# Patient Record
Sex: Male | Born: 1945
Health system: Southern US, Community
[De-identification: ages and names within clinical notes are randomized; demographics above are authoritative.]

## PROBLEM LIST (undated history)

## (undated) DIAGNOSIS — I480 Paroxysmal atrial fibrillation: Secondary | ICD-10-CM

## (undated) DIAGNOSIS — H409 Unspecified glaucoma: Secondary | ICD-10-CM

## (undated) DIAGNOSIS — I509 Heart failure, unspecified: Secondary | ICD-10-CM

## (undated) DIAGNOSIS — R918 Other nonspecific abnormal finding of lung field: Secondary | ICD-10-CM

## (undated) DIAGNOSIS — I10 Essential (primary) hypertension: Secondary | ICD-10-CM

## (undated) DIAGNOSIS — F17200 Nicotine dependence, unspecified, uncomplicated: Secondary | ICD-10-CM

## (undated) DIAGNOSIS — N183 Chronic kidney disease, stage 3 unspecified: Secondary | ICD-10-CM

## (undated) DIAGNOSIS — D472 Monoclonal gammopathy: Secondary | ICD-10-CM

## (undated) DIAGNOSIS — I2510 Atherosclerotic heart disease of native coronary artery without angina pectoris: Secondary | ICD-10-CM

## (undated) DIAGNOSIS — I714 Abdominal aortic aneurysm, without rupture, unspecified: Secondary | ICD-10-CM

## (undated) DIAGNOSIS — E119 Type 2 diabetes mellitus without complications: Secondary | ICD-10-CM

## (undated) HISTORY — DX: Essential (primary) hypertension: I10

## (undated) HISTORY — DX: Type 2 diabetes mellitus without complications: E11.9

## (undated) HISTORY — DX: Nicotine dependence, unspecified, uncomplicated: F17.200

## (undated) HISTORY — PX: CORNEAL TRANSPLANT: SHX108

## (undated) HISTORY — DX: Heart failure, unspecified: I50.9

## (undated) HISTORY — DX: Unspecified glaucoma: H40.9

## (undated) HISTORY — PX: OTHER SURGICAL HISTORY: SHX169

## (undated) HISTORY — PX: EYE SURGERY: SHX253

## (undated) HISTORY — DX: Abdominal aortic aneurysm, without rupture: I71.4

## (undated) HISTORY — DX: Abdominal aortic aneurysm, without rupture, unspecified: I71.40

## (undated) HISTORY — DX: Atherosclerotic heart disease of native coronary artery without angina pectoris: I25.10

---

## 1998-05-27 ENCOUNTER — Ambulatory Visit (HOSPITAL_COMMUNITY): Admission: RE | Admit: 1998-05-27 | Discharge: 1998-05-27 | Payer: Self-pay | Admitting: Gastroenterology

## 1999-04-10 ENCOUNTER — Emergency Department (HOSPITAL_COMMUNITY): Admission: EM | Admit: 1999-04-10 | Discharge: 1999-04-10 | Payer: Self-pay | Admitting: Emergency Medicine

## 1999-04-10 ENCOUNTER — Encounter: Payer: Self-pay | Admitting: Emergency Medicine

## 2006-01-19 ENCOUNTER — Emergency Department (HOSPITAL_COMMUNITY): Admission: EM | Admit: 2006-01-19 | Discharge: 2006-01-19 | Payer: Self-pay | Admitting: *Deleted

## 2006-04-06 ENCOUNTER — Ambulatory Visit: Payer: Self-pay | Admitting: Family Medicine

## 2006-04-10 ENCOUNTER — Ambulatory Visit: Payer: Self-pay | Admitting: Internal Medicine

## 2006-04-10 ENCOUNTER — Ambulatory Visit: Payer: Self-pay | Admitting: Pulmonary Disease

## 2006-04-10 ENCOUNTER — Inpatient Hospital Stay (HOSPITAL_COMMUNITY): Admission: EM | Admit: 2006-04-10 | Discharge: 2006-04-14 | Payer: Self-pay | Admitting: Emergency Medicine

## 2006-04-29 ENCOUNTER — Ambulatory Visit: Payer: Self-pay | Admitting: Cardiology

## 2006-05-11 ENCOUNTER — Ambulatory Visit: Payer: Self-pay | Admitting: Pulmonary Disease

## 2006-05-19 ENCOUNTER — Ambulatory Visit: Payer: Self-pay | Admitting: Family Medicine

## 2006-05-24 ENCOUNTER — Ambulatory Visit (HOSPITAL_COMMUNITY): Admission: RE | Admit: 2006-05-24 | Discharge: 2006-05-24 | Payer: Self-pay | Admitting: Pulmonary Disease

## 2006-06-03 ENCOUNTER — Ambulatory Visit: Payer: Self-pay | Admitting: Cardiology

## 2006-07-14 ENCOUNTER — Ambulatory Visit: Payer: Self-pay | Admitting: Internal Medicine

## 2006-08-03 ENCOUNTER — Ambulatory Visit: Payer: Self-pay | Admitting: Family Medicine

## 2006-09-03 ENCOUNTER — Ambulatory Visit: Payer: Self-pay | Admitting: Internal Medicine

## 2006-09-03 LAB — CONVERTED CEMR LAB
BUN: 19 mg/dL (ref 6–23)
Creatinine, Ser: 1.2 mg/dL (ref 0.4–1.5)
GFR calc non Af Amer: 66 mL/min
Glomerular Filtration Rate, Af Am: 79 mL/min/{1.73_m2}
Potassium: 3.8 meq/L (ref 3.5–5.1)
Pro B Natriuretic peptide (BNP): 15 pg/mL (ref 0.0–100.0)
Sodium: 140 meq/L (ref 135–145)

## 2006-09-07 ENCOUNTER — Ambulatory Visit: Payer: Self-pay | Admitting: Family Medicine

## 2006-09-29 ENCOUNTER — Ambulatory Visit: Payer: Self-pay | Admitting: Cardiology

## 2006-10-11 ENCOUNTER — Ambulatory Visit: Payer: Self-pay | Admitting: Internal Medicine

## 2006-10-13 ENCOUNTER — Ambulatory Visit (HOSPITAL_COMMUNITY): Admission: RE | Admit: 2006-10-13 | Discharge: 2006-10-14 | Payer: Self-pay | Admitting: Orthopedic Surgery

## 2006-11-02 ENCOUNTER — Ambulatory Visit: Payer: Self-pay | Admitting: Cardiology

## 2006-11-17 ENCOUNTER — Ambulatory Visit: Payer: Self-pay | Admitting: Family Medicine

## 2006-12-13 ENCOUNTER — Ambulatory Visit: Payer: Self-pay

## 2006-12-13 ENCOUNTER — Encounter: Payer: Self-pay | Admitting: Cardiovascular Disease

## 2007-01-03 ENCOUNTER — Ambulatory Visit: Payer: Self-pay | Admitting: Cardiology

## 2007-01-03 LAB — CONVERTED CEMR LAB
BUN: 20 mg/dL (ref 6–23)
GFR calc Af Amer: 98 mL/min
Potassium: 3.6 meq/L (ref 3.5–5.1)
Pro B Natriuretic peptide (BNP): 24 pg/mL (ref 0.0–100.0)
Sodium: 137 meq/L (ref 135–145)

## 2007-02-15 ENCOUNTER — Ambulatory Visit: Payer: Self-pay | Admitting: Cardiology

## 2007-02-17 ENCOUNTER — Ambulatory Visit: Payer: Self-pay | Admitting: Family Medicine

## 2007-03-24 ENCOUNTER — Ambulatory Visit: Payer: Self-pay | Admitting: Internal Medicine

## 2007-04-21 ENCOUNTER — Ambulatory Visit: Payer: Self-pay | Admitting: Cardiology

## 2007-05-10 ENCOUNTER — Ambulatory Visit: Payer: Self-pay | Admitting: Cardiology

## 2007-05-10 ENCOUNTER — Ambulatory Visit: Payer: Self-pay | Admitting: Internal Medicine

## 2007-05-10 LAB — CONVERTED CEMR LAB
BUN: 19 mg/dL (ref 6–23)
GFR calc Af Amer: 79 mL/min
Potassium: 3.3 meq/L — ABNORMAL LOW (ref 3.5–5.1)
Sodium: 141 meq/L (ref 135–145)

## 2007-05-19 ENCOUNTER — Ambulatory Visit: Payer: Self-pay | Admitting: Family Medicine

## 2007-06-04 ENCOUNTER — Emergency Department (HOSPITAL_COMMUNITY): Admission: EM | Admit: 2007-06-04 | Discharge: 2007-06-05 | Payer: Self-pay | Admitting: Emergency Medicine

## 2007-07-05 ENCOUNTER — Ambulatory Visit: Payer: Self-pay | Admitting: Cardiology

## 2007-10-03 ENCOUNTER — Ambulatory Visit: Payer: Self-pay | Admitting: Cardiology

## 2007-10-03 LAB — CONVERTED CEMR LAB
ALT: 16 units/L (ref 0–53)
AST: 17 units/L (ref 0–37)
Albumin: 3.4 g/dL — ABNORMAL LOW (ref 3.5–5.2)
Calcium: 9.1 mg/dL (ref 8.4–10.5)
Chloride: 104 meq/L (ref 96–112)
Cholesterol: 189 mg/dL (ref 0–200)
Creatinine, Ser: 1.2 mg/dL (ref 0.4–1.5)
GFR calc non Af Amer: 65 mL/min
Glucose, Bld: 84 mg/dL (ref 70–99)
HDL: 38.3 mg/dL — ABNORMAL LOW (ref >39.0)
Pro B Natriuretic peptide (BNP): 22 pg/mL (ref 0.0–100.0)
Sodium: 140 meq/L (ref 135–145)
Total Bilirubin: 0.4 mg/dL (ref 0.3–1.2)
VLDL: 20 mg/dL (ref 0–40)

## 2007-11-08 ENCOUNTER — Ambulatory Visit: Payer: Self-pay | Admitting: Cardiology

## 2008-11-29 DIAGNOSIS — I472 Ventricular tachycardia: Secondary | ICD-10-CM

## 2008-11-29 DIAGNOSIS — I4729 Other ventricular tachycardia: Secondary | ICD-10-CM | POA: Insufficient documentation

## 2008-11-29 DIAGNOSIS — I5020 Unspecified systolic (congestive) heart failure: Secondary | ICD-10-CM | POA: Insufficient documentation

## 2008-11-29 DIAGNOSIS — H409 Unspecified glaucoma: Secondary | ICD-10-CM | POA: Insufficient documentation

## 2008-11-29 DIAGNOSIS — I2510 Atherosclerotic heart disease of native coronary artery without angina pectoris: Secondary | ICD-10-CM

## 2008-11-29 DIAGNOSIS — I509 Heart failure, unspecified: Secondary | ICD-10-CM

## 2008-11-29 DIAGNOSIS — I10 Essential (primary) hypertension: Secondary | ICD-10-CM | POA: Insufficient documentation

## 2008-11-30 ENCOUNTER — Ambulatory Visit: Payer: Self-pay | Admitting: Cardiology

## 2008-11-30 ENCOUNTER — Encounter: Payer: Self-pay | Admitting: Nurse Practitioner

## 2008-11-30 DIAGNOSIS — I714 Abdominal aortic aneurysm, without rupture: Secondary | ICD-10-CM

## 2008-11-30 DIAGNOSIS — F17200 Nicotine dependence, unspecified, uncomplicated: Secondary | ICD-10-CM | POA: Insufficient documentation

## 2008-11-30 DIAGNOSIS — I5022 Chronic systolic (congestive) heart failure: Secondary | ICD-10-CM

## 2008-11-30 DIAGNOSIS — I779 Disorder of arteries and arterioles, unspecified: Secondary | ICD-10-CM | POA: Insufficient documentation

## 2008-12-17 ENCOUNTER — Telehealth: Payer: Self-pay | Admitting: Cardiology

## 2009-10-29 ENCOUNTER — Ambulatory Visit: Payer: Self-pay | Admitting: Family Medicine

## 2011-01-06 NOTE — Assessment & Plan Note (Signed)
[facility]                          CHRONIC HEART FAILURE NOTE   NAME:RORIEFayez, Sturgell                        MRN:          [id number]  DATE:01/03/2007                            DOB:          [birth year]    ADDENDUM:  On his medication clarification, the patient is supposed to  be taking Lisinopril 5 mg p.o. b.i.d.  He states he has only been taking  it once a day for several weeks because he was afraid he was going to  run out.  Blood pressure today is 109/72.  I am okay with him just  continuing 5 mg daily.  If his blood pressure starts to run high again,  we will have to increase the dose.  For now we will leave it at that.      [name], ACNP       Rollene Rotunda, MD, [facility]    MB/MedQ  DD: 01/03/2007  DT: 01/03/2007  Job #: [id number]   cc:   Sharlot Gowda, M.D.

## 2011-01-06 NOTE — Assessment & Plan Note (Signed)
[facility]                          CHRONIC HEART FAILURE NOTE   NAME:[name], [name]                        MRN:          [id number]  DATE:05/10/2007                            DOB:          06/05/1946    PRIMARY CARE PHYSICIAN:  Dr. Sharlot Gowda.   PRIMARY CARDIOLOGIST:  Dr. Rollene Rotunda.   [name] returns today for followup of his congestive heart failure which  is secondary to nonischemic cardiomyopathy with an EF currently 35 to  40% by echocardiogram.  Previous EF as low as 15%.  [name] states he  has been doing quite well.  He saw Dr. Antoine Poche last month for a routine  cardiology visit, at which time further titration of his ACE inhibitor  was initiated with patient denying any adverse reactions.  He denies any  symptoms suggestive of volume overload, continues to remain Class I [place] Heart Association in regards to symptoms.  Ongoing tobacco use.   PAST MEDICAL HISTORY:  1. Congestive heart failure secondary to nonischemic cardiomyopathy      with EF currently 35 to 40%.  2. Status post cardiac catheterization in 2007 showed nonobstructive      disease, EF at that time was 15%.  3. Hypertension.  4. Avoid tobacco use.  5. History of medical noncompliance.  6. Glaucoma.  7. History of benign pulmonary nodules.  8. History of nonsustained V-tach.   REVIEW OF SYSTEMS:  As stated above.   CURRENT MEDICATIONS:  1. Aspirin 81.  2. Travatan and Cosopt eye drops.  3. Bisoprolol HCT 10/6.25 daily.  4. Furosemide previously was 20 mg, the patient states he was told to      increase it to 40 mg at last visit with Dr. Antoine Poche.  5. Lisinopril 7.5 mg daily.   PHYSICAL EXAM:  Weight 160, blood pressure 114/67 with a heart rate of  61.  [name] is in no acute distress.  No jugular vein distention at 45  degree angle.  LUNGS:  Clear to auscultation bilaterally.  CARDIOVASCULAR EXAM:  An S1 and S2, regular rate and rhythm.  ABDOMEN:   Soft, nontender, positive bowel sounds.  LOWER EXTREMITIES:  Without clubbing, cyanosis or edema.  NEUROLOGIC:  Alert and oriented x3.   IMPRESSION:  Heart failure, currently Class I [place] Heart  Association.  Will increase lisinopril to 10 mg by mouth daily, continue  other current medications.  Once again encouraged smoking cessation.  Will check blood work today, previously ordered by Dr. Antoine Poche, and see  patient back in two months.      [name], ACNP  Electronically Signed      [name], MD  Electronically Signed   MB/MedQ  DD: 05/10/2007  DT: 05/10/2007  Job #: [id number]   cc:   Sharlot Gowda, M.D.

## 2011-01-06 NOTE — Assessment & Plan Note (Signed)
[facility]                          CHRONIC HEART FAILURE NOTE   NAME:[name], [name]                        MRN:          [id number]  DATE:02/15/2007                            DOB:          May 08, 1946    [name] returns today for followup of his congestive heart failure which  is secondary to non-ischemic cardiomyopathy with an EF previously 15%,  currently 35% to 40% by echocardiogram.  [name] states he has been  doing well.  He still complains of lack of energy.  He says he has no  get up and go; however, he continues to try to remain active, for  example, he still push mows his lawn.  He states what used to take him  40 minutes now takes him about an hour and a half. He does it in stages,  take a break and rest.  He denies any increased shortness of breath with  exertion.  He states he just does not have the energy he used to.  He  unfortunately continues to smoke at least a half a pack of cigarettes a  day.  He denies any peripheral edema, chest discomfort, presyncope or  syncopal episodes.  States he sleeps good.  Compliant with medications.   PAST MEDICAL HISTORY:  1. Includes congestive heart failure, secondary to non-ischemic      cardiomyopathy.  Status post cardiac catheterization in 2007showed      no obstructive disease with EF of 15% at that time.  Recent      echocardiogram in April 2008 showed an improvement EF 35% to 40%.  2. Hypertension.  3. Ongoing tobacco use  4. History of medical noncompliance.  5. Glaucoma.  6. History of pulmonary nodules.   REVIEW OF SYSTEMS:  As stated above.   CURRENT MEDICATIONS:  Include:  1. Aspirin 81 mg daily.  2. Furosemide, patient has 40 mg tablet, he takes 1/2 tablet daily.  3. Cosopt and Travatan eye drops.  4. Toprol/hydrochlorothiazide 10/6.25 mg daily.  5. Lisinopril 5 mg daily.   PHYSICAL EXAMINATION:  Weight today 160 pounds, weight is down 6 pounds  from May.  Blood pressure  90/69, pulse 56.  [name] is in no acute distress.  No jugular vein distention at 45 degree angle.  LUNGS:  Clear to auscultation today.  I do not hear any rhonchi or  wheezing.  ABDOMEN:  Soft, nontender, positive bowel sounds.  CARDIOVASCULAR:  Reveals an S1 and S2, regular rate and rhythm.  LOWER EXTREMITIES:  Without clubbing, cyanosis or edema.   IMPRESSION:  Stable Class II heart failure at this time.  No symptoms  such as volume overload with ongoing tobacco use.  Will continue current  medications.  I had obtained blood work on [place] back in May when I  saw him.  Potassium was 3.6, glucose 125, BUN, creatinine 20 and 1.0,  BNP was 24.  I will send a copy of patient's blood work to Dr. Susann Givens,  as patient's glucose has been mildly elevated the last few times he has  had a BMET drawn.  Otherwise, no changes  at this time.  The patient  needs to follow up with Dr. Antoine Poche for routine cardiology visit.  I  will arrange this at his next appointment.      [name], ACNP  Electronically Signed      Rollene Rotunda, MD, [facility]  Electronically Signed   MB/MedQ  DD: 02/15/2007  DT: 02/15/2007  Job #: [id number]   cc:   Sharlot Gowda, M.D.

## 2011-01-06 NOTE — Assessment & Plan Note (Signed)
[facility]                          CHRONIC HEART FAILURE NOTE   [name], [name]                        MRN:          [id number]  DATE:10/03/2007                            DOB:          03-27-1946    PRIMARY CARDIOLOGIST:  Rollene Rotunda, MD.   PRIMARY CARE PHYSICIAN:  Sharlot Gowda, M.D.   HISTORY:  [name] returns today for follow up of his congestive heart  failure which is secondary to nonischemic cardiomyopathy with EF  currently 35-40% by echocardiogram, previously as low as 15%.  I have  not seen [name] since November.  He states he has been doing well  since that time.  He complains of intolerance to the cold weather.  States he has not been as active as he normally is and this accounts for  his weight gain.  He denies any symptoms suggestive of volume overload.  Denies any syncope, palpitations.  When I  last saw [name] back in  the fall, he has had a syncopal episode in the setting of ETOH  intoxication.  States he is no longer drinking.  He does continue to  smoke about a pack of cigarettes a day, otherwise denies any problems.  He states he has an appointment with Dr. Susann Best in April for a physical  exam   PAST MEDICAL HISTORY:  1. Congestive heart failure secondary to nonischemic cardiomyopathy      with EF 35%.  2. Status post cardiac catheterization in 2007 showing nonobstructive      disease.  3. Hypertension.  4. Ongoing tobacco use.  5. History of medical noncompliance.  6. Glaucoma.  7. History of ETOH abuse  8. History of benign pulmonary nodules  9. History of nonsustained VTach.   REVIEW OF SYSTEMS:  As stated above, otherwise negative.   CURRENT MEDICATIONS:  1. Aspirin 81.  2. Travatan eye drops.  3. Cosopt eye drops.  4. Bisoprolol/HCTZ 10/6.25 daily.  5. Furosemide 40 mg daily.  6. Lisinopril 10 mg daily.   PHYSICAL EXAMINATION:  VITAL SIGNS:  Weight 162 pounds, weight is up 6  pounds, blood  pressure 131/83 with a heart rate of 56.  GENERAL:  [name] is in no acute distress.  NECK:  No signs of jugular vein distention at 45 degree angle.  LUNGS:  Clear to auscultation.  CARDIOVASCULAR:  Reveals S1-S2, regular rate and rhythm.  ABDOMEN:  Flat, soft, nontender, positive bowel sounds.  LOWER EXTREMITIES:  Without clubbing, cyanosis or edema.  NEUROLOGICAL:  Alert and oriented x3.   IMPRESSION:  Heart failure secondary to nonischemic cardiomyopathy class  I [place] Heart Association.  Continue current medications.  Once  again, encouraged smoking cessation.  Congratulated on for ETOH  discontinuation.  We will plan on repeating lab work today as it has  been a while since the patient had blood work done.  As [name] is  fasting, I will go ahead and check a lipid, hepatic panel on him and a  TSH.  We will ask that a copy of blood work be sent to Dr.  Lalonde.      [name], ACNP  Electronically Signed      Rollene Rotunda, MD, [facility]  Electronically Signed   MB/MedQ  DD: 10/03/2007  DT: 10/04/2007  Job #: [id number]   cc:   Sharlot Gowda, M.D.

## 2011-01-06 NOTE — Assessment & Plan Note (Signed)
[facility]                          CHRONIC HEART FAILURE NOTE   NAME:[name]                        MRN:          [id number]  DATE:07/05/2007                            DOB:          01/23/1946    PRIMARY CARE PHYSICIAN:  Dr. Sharlot Gowda.   PRIMARY CARDIOLOGIST:  Dr. Antoine Poche.   [name] returns today for followup of his congestive heart failure,  which is secondary to nonischemic cardiomyopathy with EF currently 35%  to 40% by echocardiogram, previously as low as 15%.  [name] states he  has been going quite well.  He recently suffered a fracture of his left  ankle in the setting of a syncopal episode.  In further discussion with  [name], it turns out he was celebrating the [employer] homecoming, had  consumed some EtOH and some smoked sausages, when he states the next  thing he remembers he was standing in the doorway, and then he was on  the floor.  He was seen at [facility] with an elevated  EtOH level, although at this time he currently denies any EtOH  consumption that evening.  He also denies any palpitations, chest  discomfort.  CT of the head was done.  It showed no acute findings.  An  EKG was done that showed sinus brady with a first degree; unchanged from  previous EKGs.  He states other than that episode, he feels fine.  He is  complaining of some insomnia, difficult to get comfortable at night with  the ankle fracture.  He is ambulating with assistance of cane, and is  wearing an Unna boot to the left lower extremity, and being followed by  orthopedic services.   PAST MEDICAL HISTORY:  1. Congestive heart failure, secondary to nonischemic cardiomyopathy      with EF currently 35% to 40%.  2. Status post cardiac catheterization in 2007 showed nonobstructive      disease.  EF at that time was 15.  3. Hypertension.  4. Ongoing tobacco use.  5. History of medical noncompliance.  6. Glaucoma.  7. EtOH use.  8.  History of benign pulmonary nodules.  9. History of nonsustained V-TACH.   REVIEW OF SYSTEMS:  As stated above.   CURRENT MEDICATIONS:  1. Aspirin 81.  2. Travatan and Cosopt eye drops.  3. Bisoprolol and hydrochlorothiazide 10/6.25 daily.  4. Furosemide 40 mg daily.  5. Lisinopril 10 mg daily.   CLINICAL DATA:  A 12-lead EKG today showing sinus brady with a first  degree block at rate of 55.   [name] is in no acute distress.  Weight 156 pounds today.  Weight is down 4 pounds.  Initial blood  pressure recorded at 140/80.  Manual blood pressure left arm 118/76 with  a heart rate of 64.  No signs of jugular vein distention at 45-degree angle.  LUNGS:  Clear to auscultation bilaterally.  CARDIOVASCULAR EXAM:  Reveals an S1 and S2.  Regular rate and rhythm.  ABDOMEN:  Soft, nontender.  Positive bowel sounds.  LOWER EXTREMITIES:  Right lower extremity  without clubbing, cyanosis, or  edema.  Left lower extremity is in an Unna boot at this time.  NEUROLOGIC:  Patient alert and oriented x3.   IMPRESSION:  Nonischemic cardiomyopathy, currently a class I [place]  Heart Association.  We will continue current medications.  Once again  encouraged smoking cessation and EtOH cessation.  Review of most recent  lab work in September shows a BUN and creatinine of 19 and 1.2.  We will  plan on repeating blood work at next exam.  The patient also had blood  work done at [facility] recent visit.  Lab work within  normal range at that time.      [name], ACNP  Electronically Signed      Rollene Rotunda, MD, [facility]  Electronically Signed   MB/MedQ  DD: 07/05/2007  DT: 07/05/2007  Job #: [id number]   cc:   Sharlot Gowda, M.D.

## 2011-01-06 NOTE — Assessment & Plan Note (Signed)
[facility]                            CARDIOLOGY OFFICE NOTE   [name], [name]                        MRN:          [id number]  DATE:11/08/2007                            DOB:          04-04-1946    PRIMARY CARE PHYSICIAN:  Dr. Sharlot Gowda.   REASON FOR VISIT:  Evaluate patient with dilated cardiomyopathy.   HISTORY OF PRESENT ILLNESS:  The patient is a pleasant 65 year old,  African-American gentleman with nonischemic cardiomyopathy.  He has done  well since we last saw him.  He has staying off alcohol.  He is still  smoking cigarettes unfortunately. His last EF was improved as described  below.  He is not having any dyspnea with activity such as climbing  stairs or working vigorously.  He denies any PND or orthopnea. He has  had no chest pain. He has had  no palpitations, presyncope or syncope.  He denies any orthostatic symptoms.   PAST MEDICAL HISTORY:  Congestive heart failure secondary to nonischemic  cardiomyopathy (EF currently 35-40% had been 15%), nonobstructive  coronary disease (Catheterization 2007), hypertension, ongoing tobacco  use, glaucoma, previous ETOH use, benign pulmonary nodules, non same  ventricular tachycardia.   ALLERGIES:  None.   MEDICATIONS:  1. Aspirin 81 mg daily.  2. Travatan.  3. Cosopt.  4. Bisoprolol HCT 10/6.25 daily.  5. Furosemide 40 mg daily.  6. Lisinopril 5 mg daily.   REVIEW OF SYSTEMS:  As stated in the HPI and otherwise negative for  other systems.   PHYSICAL EXAMINATION:  The patient is in no distress.  Blood pressure 91/70, heart rate 72 and regular, weight 159 pounds.  HEENT:  Eyelids unremarkable. Pupils equal round and reactive to light,  fundi not visualized, oral mucosa unremarkable.  NECK:  No jugular distention at 45 degrees, carotid upstroke brisk and  symmetrical.  No bruits, no thyromegaly.  LYMPHATICS:  No adenopathy.  LUNGS:  Clear to auscultation bilaterally.  BACK:  No  costovertebral mass.  CHEST:  Unremarkable.  HEART:  PMI not displaced or sustained, S1 and S2 within normal limits.  No S3, no S4, no clicks, no rubs, no murmurs.  ABDOMEN:  Flat, positive bowel sounds,  normal in frequency and pitch,  no bruits, no rebound, no guarding, no midline pulsatile mass, no  organomegaly.  EXTREMITIES:  2+ pulse, no edema.   EKG sinus rhythm, rate 64, right bundle branch block, left anterior  fascicular block.   ASSESSMENT/PLAN:  1. Nonischemic cardiomyopathy. The patient has class I symptoms.  His      ejection fraction is 35-40%.  There is no indication for devices.      He is tolerating the medications as listed though his blood      pressure is low.  There is no room for med titration.  He has had      his labs checked recently. At this point, he will continue with the      current regimen.  He will continue to abstain from alcohol.  2. Tobacco. We discussed the need to stop smoking and hopefully he  will work on this.  3. Hypertension.  This is no longer an issue.  4. Follow-up. I will see the patient again in 6 months or sooner if      needed.     Rollene Rotunda, MD, [facility]  Electronically Signed    JH/MedQ  DD: 11/08/2007  DT: 11/08/2007  Job #: [id number]   cc:   Sharlot Gowda, M.D.

## 2011-01-06 NOTE — Assessment & Plan Note (Signed)
[facility]                            CARDIOLOGY OFFICE NOTE   CRYSTAL, ELLWOOD                        MRN:          [id number]  DATE:04/21/2007                            DOB:          [birth year]    PRIMARY CARE PHYSICIAN:  Dr. Sharlot Gowda.   REASON FOR PRESENTATION:  Evaluate patient with cardiomyopathy.   HISTORY OF PRESENT ILLNESS:  The patient is a pleasant 65 year old  gentleman with nonischemic cardiomyopathy.  His ejection fraction  originally was 15% in 2007, both on echo and cath; however, most recent  echo in April of this year demonstrated the ejection fraction now to be  35% to 40%.  He has done quite well with medication titration.  He has  no dyspnea.  He is not as active as I would like him to be.  He does do  some yard work.  With this, he does not get any shortness of breath.  He  denies any PND or orthopnea (class I symptoms).  He denies any chest  discomfort, neck discomfort, arm discomfort, activity-induced nausea,  vomiting, excessive diaphoresis.  Unfortunately, he is continuing to  smoke cigarettes.   PAST MEDICAL HISTORY:  1. Cardiomyopathy (35% to 40%, nonischemic, status post cardiac      catheterization 2007, demonstrating nonobstructive coronary      disease).  2. Hypertension.  3. Ongoing tobacco use.  4. Previous medical noncompliance.  5. Glaucoma.  6. Pulmonary nodules felt to be benign.  7. Nonsustained ventricular tachycardia.   ALLERGIES:  None.   MEDICATIONS:  1. Aspirin 81 mg daily.  2. Travatan.  3. Cosopt.  4. Bisoprolol 10/6.25 daily.  5. Lisinopril 5 mg daily.  6. Furosemide 20 mg daily.   REVIEW OF SYSTEMS:  As stated in the HPI and otherwise negative for  other systems.   PHYSICAL EXAMINATION:  The patient is in no distress.  Blood pressure 128/81, heart rate 53 and regular.  HEENT:  Eyelids unremarkable, pupils are equal, round, and reactive to  light, fundi not visualized.  Oral  mucosa, poor dentition.  NECK:  No jugular venous distension at 45 degrees, carotid upstroke  brisk and symmetrical.  No bruit.  No thyromegaly.  LYMPHATICS:  No cervical, axillary, inguinal adenopathy.  LUNGS:  Clear to auscultation bilaterally.  BACK:  No costovertebral angle tenderness.  CHEST:  Unremarkable.  HEART:  PMI not displaced or sustained.  S1 and S2 are within normal  limits.  No S3, no S4.  No clicks, no rubs, no murmurs.  ABDOMEN:  Flat, positive bowel sounds, normal in frequency and pitch.  No bruits, no rebound, no guarding.  No midline pulsatile mass.  No  hepatomegaly, no splenomegaly.  SKIN:  No rashes, no nodules.  EXTREMITIES:  2+ pulses throughout, no edema.  No cyanosis, no clubbing.  NEURO:  Grossly intact throughout.   EKG sinus bradycardia, 1st degree AV block, left axis deviation, left  anterior fascicular block.  Poor anterior R-wave progression suggestive  of old anteroseptal infarct.  No acute ST-T wave changes.  No change  from previous EKGs.  ASSESSMENT AND PLAN:  1. Cardiomyopathy:  Today I will titrate his angiotensin-converting      enzyme inhibitor to 7.5 mg of Lisinopril daily.  I would like this      to be 10 or even 20 if possible.  We cannot titrate his beta      blocker with his heart rate.  He has had a significant improvement      in his ejection fraction with medication titration.  I have      described the importance of medications and the importance of      compliance.  2. Tobacco:  I spent greater than 3 minutes discussing the need to      quit smoking.  We discussed Chantix.  He would like to use nicotine      patches instead.  3. Followup.  Will get a BMET in 2 weeks and come back to the heart      failure clinic to see if he can have further medication titration.     Rollene Rotunda, MD, [facility]  Electronically Signed    JH/MedQ  DD: 04/21/2007  DT: 04/21/2007  Job #: [id number]   cc:   Sharlot Gowda, M.D.

## 2011-01-06 NOTE — Assessment & Plan Note (Signed)
[facility]                          CHRONIC HEART FAILURE NOTE   NAME:RORIEAndie, Mortimer                        MRN:          [id number]  DATE:03/24/2007                            DOB:          [birth year]    Mr. Saintvil returns today for followup of his congestive heart failure  which is secondary to nonischemic cardiomyopathy with an EF currently  35% to 40% by echocardiogram.  Previously as low as 15%.  Mr. Risinger  states he has been doing quite well.  He recently attended a family  reunion.  He stated there were approximately 200 people there.  He was  outside a lot of the time.  The heat did bother his breathing and he was  more fatigued.  Other than that, no problems.  He denies any orthopnea  or PND, presyncope, or syncopal episodes.  He continues to do light  activities.  He still push mows his lawn in stages, tries to avoid the  heat of the day.  Mild dyspnea on exertion.  He recently saw Dr. Susann Givens  for followup, noted he had an elevated glucose level on his recent lab  work.  He states Dr. Susann Givens checked him and told him everything was  okay.  Mr. Purdy continues to smoke half a pack of cigarettes a day.   PAST MEDICAL HISTORY:  1. Congestive heart failure secondary to nonischemic cardiomyopathy,      with an EF currently 35% to 40%.  2. Status post cardiac catheterization in 2007 that showed      nonobstructive disease.  EF at that time was 15%.  3. Hypertension.  4. Ongoing tobacco use.  5. History of medical noncompliance.  6. Glaucoma.  7. History of pulmonary nodules.  8. History of nonsustained V-tach.  9. History of bilateral pulmonary nodules followed by Dr. Susann Givens and      Dr. Shelle Iron.  Most recent chest CT, December of 2007, showed an      interval improvement and scattered, tiny pulmonary nodules      consistent with resolving infectious/inflammatory process.  No      further workup needed at this time per Dr. Shelle Iron.   REVIEW  OF SYSTEMS:  As stated above.   CURRENT MEDICATIONS:  1. Aspirin 81 mg daily.  2. Travatan.  3. Cosopt eye drops.  4. Bisoprolol/hydrochlorothiazide 10/6.25 mg daily.  5. Lisinopril 5 mg daily.  6. Furosemide 20 mg daily.   PHYSICAL EXAMINATION:  Weight 161 pounds.  Weight is up 1 pound.  Blood  pressure initially recorded at 122/75, manual blood pressure 96/70 left  arm, heart rate of 55.  Mr. Rosko is in no acute distress.  A very pleasant gentleman.  No jugular venous distension at a 45 degree angle.  LUNGS:  He has fine, scattered rhonchi right upper lobe, otherwise clear  to auscultation.  CARDIOVASCULAR EXAM:  Reveals an S1 and S2, regular rate and rhythm.  ABDOMEN:  Soft, nontender, positive bowel sounds.  LOWER EXTREMITIES:  Without cyanosis, clubbing, or edema.  NEUROLOGIC:  The patient is alert and oriented x3, up  ambulating without  assistance.   IMPRESSION:  Stable heart failure secondary to nonischemic  cardiomyopathy with an ejection fraction currently 35% to 40%.  Blood  pressure well controlled.  Will continue current medications.  Once  again, strongly encouraged smoking cessation.  The patient is due for a  routine cardiology visit with Dr. Antoine Poche his primary cardiologist.  Will arrange for him to follow up with Dr. Antoine Poche.  I have asked the  patient to go ahead and fast before his appointment with Dr. Antoine Poche in  case he wants to check lipids and LFTs on patient.  I do not see that he  has had cholesterol panel done since this time last year.      [name], ACNP  Electronically Signed      [name], MD  Electronically Signed   MB/MedQ  DD: 03/24/2007  DT: 03/24/2007  Job #: [id number]   cc:   Sharlot Gowda, M.D.

## 2011-01-06 NOTE — Assessment & Plan Note (Signed)
[name]                          CHRONIC HEART FAILURE NOTE   [name], [name]                        MRN:          [id number]  DATE:01/03/2007                            DOB:          07-10-1946    PRIMARY CARE PHYSICIAN:  Sharlot Gowda, M.D.   ORTHOPEDIST:  Gean Birchwood, M.D.   CARDIOLOGIST:  Rollene Rotunda, M.D.   HISTORY OF PRESENT ILLNESS:  [name] returns today for followup of his  congestive heart failure, which is secondary to non-ischemic  cardiomyopathy with an ejection fraction of 15% by cardiac  catheterization in the past. Recently repeated his echocardiogram with  improvement of ejection fraction to 35% to 40%. [name] states that he  has been doing well. He complains of occasional wheezing, occasional  cough, however, he continues to smoke 1/2 pack of cigarettes a day.  Denies any symptoms suggestive of volume overload. States he is please  with his quality of life at this point.   PAST MEDICAL HISTORY:  1. Congestive heart failure secondary to non-ischemic cardiomyopathy.      Cardiac catheterization in 2007 showed non-obstructive disease with      an EF at that time of 15%, currently improved to 35% to 40% by      echocardiogram in April of 2008.  2. Hypertension.  3. Ongoing tobacco use.  4. History of medical non-compliance.  5. Glaucoma.   MEDICATIONS:  Include aspirin 81, Travatan and Cosopt eye drops for his  glaucoma, Furosemide 40 mg daily, Lisinopril 5 mg daily, Bisoprolol/HCTZ  10/6.25 mg daily.   REVIEW OF SYSTEMS:  As stated above.   PHYSICAL EXAMINATION:  VITAL SIGNS:  Weight 166, blood pressure 109/72  with a heart rate of 56.  GENERAL:  [name] is in no acute distress.  NECK:  No JVD at 45 degree angle.  LUNGS:  He has some fine rhonchi in his lungs. Otherwise, clear to  auscultation.  CARDIOVASCULAR:  Examination reveals S1 and S2. Regular rate and rhythm.  ABDOMEN:  Soft, nontender. Positive  bowel sounds.  EXTREMITIES:  Lower extremities without clubbing, cyanosis, or edema.   IMPRESSION/PLAN:  Stable heart failure at the time. No signs suggestive  of volume overload. Will continue current medications. Once again, I  have encourage the patient on smoking cessation. Actually, I am going to  have the patient decrease his Furosemide to 20 mg daily. Monitor his  weight closely. Will check lab work also today, last lab work I have on  file was in February. I will see him back in 6 weeks.   ADDENDUM:  On his medication clarification, the patient is supposed to  be taking Lisinopril 5 mg p.o. b.i.d.  He states he has only been taking  it once a day for several weeks because he was afraid he was going to  run out.  Blood pressure today is 109/72.  I am okay with him just  continuing 5 mg daily.  If his blood pressure starts to run high again,  we will have to increase the dose.  For now we will  leave it at that.      [name], ACNP       Rollene Rotunda, MD, [facility]    MB/MedQ  DD: 01/03/2007  DT: 01/03/2007  Job #: [id number]   cc:   Sharlot Gowda, M.D.  Feliberto Gottron. Turner Daniels, M.D.  Rollene Rotunda, MD, [facility]

## 2011-01-09 NOTE — Consult Note (Signed)
[name], MCPEEK NO.:  [id number]   MEDICAL RECORD NO.:  [id number]          PATIENT TYPE:  INP   LOCATION:  1421                         FACILITY:  [facility]   PHYSICIAN:  Duke Salvia, MD,FACCDATE OF BIRTH:  1945-11-16   DATE OF CONSULTATION:  04/11/2006  DATE OF DISCHARGE:                                   CONSULTATION   REASON FOR CONSULTATION:  We were asked to see [name] in consultation  because of congestive heart failure.   HISTORY OF PRESENT ILLNESS:  [name] is a 65 year old gentleman who  presents to the hospital with a 3 month history of progressive shortness of  breath. He has had progressive breathlessness with exertion as well as  recently, breathlessness at rest. He has also had nocturnal dyspnea. In the  days prior to his admission, he started developing, left greater than right,  peripheral edema. He saw his primary care physician, who undertook chest x-  ray's demonstrating infiltration. This was felt to be pneumonia. Antibiotics  were started, however, because of progressive symptoms, the patient  presented to the emergency room. The patient denies an antecedent history of  heart disease. He does have a history of hypertension. Mr. Cen denies  exertional chest, neck, or arm discomfort.   CARDIAC RISK FACTORS:  His cardiac risk factors include cigarettes,  hypertension, no diabetes and lipid status is not known.   PAST MEDICAL HISTORY:  In addition to the above is notable for glaucoma.   PAST SURGICAL HISTORY:  Negative.   HOME MEDICATIONS:  Included only glaucoma drops, the name he did not recall.   ALLERGIES:  NO KNOWN DRUG ALLERGIES.   SOCIAL HISTORY:  He is married. He has 6 children, 8 grandchildren, and a  great grandchild on the way. He drinks but not frequently. He does not use  recreational drugs. He does smoke.   FAMILY HISTORY:  Noncontributory.   REVIEW OF SYSTEMS:  As noted on the intake sheet.   CURRENT  MEDICATIONS:  Since admission include Lisinopril at 2.5 and Lasix 40  b.i.d. as well as aspirin and subcutaneous heparin.   PHYSICAL EXAMINATION:  VITAL SIGNS:  Blood pressure today is 120/91, pulse  71, respiratory rate are about 16.  HEENT:  Examination demonstrated no icterus or xanthoma.  NECK:  The neck veins were 6 to 7 cm. The carotids are brisk and full  bilaterally without bruits.  BACK:  Without kyphosis or scoliosis.  LUNGS:  Clear.  HEART:  Sounds were regular with an S4. There is no S3. There is no  significant murmurs. The PMI was sustained at the anterior axillary line.  ABDOMEN:  Soft with active bowel sounds without midline pulsation or  hepatosplenomegaly.  EXTREMITIES:  Femoral pulses were 1+. Distal pulses were trace. There was no  clubbing or cyanosis. There was edema, left greater than right.  NEUROLOGIC:  Examination was grossly normal.   LABORATORY DATA:  Electrocardiogram dated yesterday at 8:14 a.m.  demonstrated sinus rhythm at 67 with intervals of 0.22, 0.12, 0.46. There  was poor R wave progression, anterior Q  waves.   Telemetry April 10, 2006, yesterday, demonstrated non-sustained ventricular  tachycardia.   IMPRESSION:  1. Congestive heart failure, acute on chronic.      a.     A modest cardiomegaly with a CT ratio of about 0.65.  2. Hypertension.  3. Abnormal electrocardiogram with Q waves inferiorly and poor R wave      progression anteriorly.  4. Non-sustained ventricular tachycardia.  5. Pulmonary nodules noted on CT scanning.   DISCUSSION:  Mr. Ludington has cardiomegaly, congestive heart failure. There are  a couple of issues. The first is diagnostic __________ systolic or diastolic  heart failure. I suspect there is probably a combination of the two. In  addition, __________ is therapeutic. He has been started on diuresis today  __________ therapy, and will need beta blocker therapy prior to discharge.   The next issue is how much evaluation  does he need. Clearly, we need to know  what his LV function is and with his non-sustained ventricular tachycardia,  I would lean towards cardiac catheterization, even if his LV function is  normal. I will tentatively schedule that for Tuesday.   RECOMMENDATIONS:  Based on the above we will then:  1. Get the 2-D echocardiogram as scheduled.  2. Continue the ace inhibitors.  3. Continue diuresis.  4. Anticipate catheterization on Tuesday.   Thank you for the consultation.           ______________________________  Duke Salvia, MD,FACC     SCK/MEDQ  D:  04/11/2006  T:  04/11/2006  Job:  [id number]   cc:   Sharlot Gowda, M.D.  Fax: [phone]

## 2011-01-09 NOTE — Op Note (Signed)
[name], [name]               ACCOUNT NO.:  [id number]   MEDICAL RECORD NO.:  [id number]          PATIENT TYPE:  OIB   LOCATION:  3703                         FACILITY:  [facility]   PHYSICIAN:  Feliberto Gottron. Turner Daniels, M.D.   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   PROCEDURE:  Left shoulder distal clavicle excision and coracoclavicular  ligament reconstruction using hamstring allograft and two #5 fiber  wires.   SURGEON:  Feliberto Gottron. Turner Daniels, M.D.   FIRST ASSISTANT:  Skip Mayer PA-C.   ANESTHETIC:  General endotracheal.   ESTIMATED BLOOD LOSS:  100 mL.   FLUID REPLACEMENT:  1 Liter crystalloid.   DRAINS PLACED:  None.   TOURNIQUET TIME:  None.   INDICATIONS FOR PROCEDURE:  65 year old man with chronic left shoulder  grade 4 AC separation that causes pain and easy fatigue of his left  shoulder.  He has lived with this for years and now desires elective  reconstruction of coracoclavicular ligament and of course the distal  clavicle excision since the [facility] joint would be helplessly arthritic.  Risks and benefits of surgery discussed at length and he is prepared for  surgical stabilization of his shoulder, questions answered.   DESCRIPTION OF PROCEDURE:  The patient identified by armband, taken to  the operating room at [facility].  Appropriate anesthetic  monitors were attached.  He received a gram of Ancef IV preoperatively  and underwent general endotracheal anesthesia in the supine position,  was then placed the 30 degrees beach chair position and the left upper  extremity prepped, draped in sterile fashion from the wrist to the  hemithorax.  The skin along the anterior aspect of the shoulder was  infiltrated with 10 mL of half percent  Marcaine and epinephrine solution  and then a vertical strap incision was made starting out at the  posterior aspect of the clavicle about 1 inch proximal to the tip and  going down to and just over the coracoid process to the skin and  subcutaneous tissue.  Small bleeders were identified and cauterized.  We  then undermined the subcutaneous tissue on top of the collar bone  medially and laterally for 2-3 cm and out over the deltoid. The  deltotrapezial fascia over the clavicle was then split longitudinally  along the axis of the clavicle and reflected anteriorly and posteriorly.  This exposed the distal tip of the clavicle. Using the oscillating saw  the distal three-quarters inch of the clavicle was then removed without  difficulty.  We then undermined underneath the clavicle as well and  placed a quarter inch drill holes in the center of the clavicle, quarter  inch in from the remaining tip and then another one 1 inch in from the  remaining tip.  We then dissected around the coracoid process and passed  a #2 Ethibond beneath the coracoid process and  used this to pull two #5  fiber wires and a hamstring allograft underneath the coracoid process.  Both ends of the #5 fiber wires were then threaded through the holes in  the clavicle and the proximal end of the hamstring allograft was taken  through the proximal hole going from inferior to superior and then  through the distal hole going from superior to inferior. Pressure was  then applied to the elbow elevating the humerus and a fiber wires were  tied in sequence obtaining good firm reduction of the clavicle to the  coracoid process region.  Once this had been accomplished a square knot  was tied in the hamstring allograft between the clavicle and the  coracoid process laterally and this square knot was then tensioned and  fixed with a 0 Vicryl suture passed through the knot itself. The ends of  the allograft were trimmed, all knots were  trimmed. The wound was  irrigated out normal saline solution.  The trapezial deltoid fascia over  the clavicle was then closed with running 0 Vicryl suture.  The  subcutaneous tissue with undyed 2-0 Vicryl suture and the skin with  running interlocking 3-0 nylon suture.  A dressing of Xeroform, 4x4  dressing, sponges, Hypafix tape, and a sling was then applied.  The  patient was then awakened and taken to recovery room without difficulty.      Feliberto Gottron. Turner Daniels, M.D.  Electronically Signed     FJR/MEDQ  D:  10/13/2006  T:  10/13/2006  Job:  [id number]

## 2011-01-09 NOTE — Assessment & Plan Note (Signed)
[name]                          CHRONIC HEART FAILURE NOTE   Best, FLUKE                        MRN:          [id number]  DATE:09/03/2006                            DOB:          [birth year]    PRIMARY CARDIOLOGIST:  Rollene Rotunda, MD, F.A.C.C.   PRIMARY CARE PHYSICIAN:  Sharlot Gowda, M.D.   [name] returns today for followup concerning his congestive heart  failure, which is secondary to non-ischemic cardiomyopathy, with an EF  of 15% by cardiac catheterization.  The patient was found, however, to  have nonobstructive CAD.  He does, however, have a history of poorly-  controlled hypertension, secondary to noncompliance.  He states he has  been doing well over the last two months.  His weight at home has been  stable between 162 and 165.  The patient states he continues to walk one  mile a day without shortness of breath.  He has been taking his Lasix,  however, incorrectly.  He should be taking 40 mg daily and he has been  taking it twice a day.  He states he misunderstood, last visit.   PAST MEDICAL HISTORY:  1. Includes congestive heart failure, secondary to non-ischemic      cardiomyopathy.  Status post cardiac catheterization, showing      nonobstructive disease with an EF of 15% with global hypokinesis.      Cardiac catheterization was done in August of 2007.  A 2D      echocardiogram was also done at that time and showed an EF of 10-      15% with mild mitral valvular regurgitation.  2. Recent CT scan of the chest showing several bilateral pulmonary      nodules.  CT of the chest was done April 10, 2006.  Recommend      further evaluation and followup.  3. Hypertension, poorly-controlled.  4. Medical noncompliance.  5. History of tobacco abuse.  The patient states he is down to a half      pack a day.  6. Glaucoma.   REVIEW OF SYSTEMS:  As stated above.   CURRENT MEDICATIONS INCLUDE:  1. Aspirin 81 mg.  2. Furosemide  should be 40 mg daily.  The patient is taking 40 mg      b.i.d.  3. Travatan and Cosopt for glaucoma.  4. Lisinopril 5 mg b.i.d.  5. Bisoprolol 10 mg q.h.s.   PHYSICAL EXAM:  Weight 173.  Weight by our scales is up 7 pounds from  November.  The patient states his weight has been consistent at home,  between 160 and 162.  Blood pressure 121/71, with a pulse of 58.  Mr.  [name] is in no acute distress.  No jugular venous distention at 45  degree angle.  Lungs are somewhat distant breath sounds, otherwise clear  to auscultation.  Cardiovascular exam reveals an S1, S2, regular rate  and rhythm.  Abdomen is soft, nontender, positive bowel sounds.  Lower  extremities without clubbing, cyanosis or edema.   IMPRESSION:  Stable heart failure at this time.  Continue current  medications.  I am going to have the patient decrease his Lasix to 40 mg  in the a.m.  I have written out all of his p.o. medications and given  the patient a written copy to make sure he is taking them as ordered.  I  have reminded the patient to follow up with Dr. Susann Givens concerning the  abnormal CT of the chest for re-evaluation.  We will plan on checking  lab work on patient today and have him follow up with Dr. Antoine Poche at  the next visit for routine cardiology care.   ADDENDUM:  Followup CT scan of the chest was done May 24, 2006  showing interval resolution of the right upper lobe airspace disease and  interval improvement in scattered tiny pulmonary nodules consistent with  resolving infectious/inflammatory process.  CT results were reviewed by  Dr. Shelle Iron, he recommended no further workup needed at this time.      [name], ACNP  Electronically Signed      [name], MD  Electronically Signed   MB/MedQ  DD: 09/03/2006  DT: 09/03/2006  Job #: [id number]   cc:   Rollene Rotunda, MD, [facility]  Sharlot Gowda, M.D.

## 2011-01-09 NOTE — Discharge Summary (Signed)
[name], [name] NO.:  [id number]   MEDICAL RECORD NO.:  [id number]          PATIENT TYPE:  INP   LOCATION:  1421                         FACILITY:  [facility]   PHYSICIAN:  Isidor Holts, M.D.  DATE OF BIRTH:  [birth year]   DATE OF ADMISSION:  04/09/2006  DATE OF DISCHARGE:  04/14/2006                           DISCHARGE SUMMARY - REFERRING   PRIMARY CARE PHYSICIAN:  Unassigned.   DISCHARGE DIAGNOSES:  1. Decompensated congestive heart failure, secondary to severe nonischemic      cardiomyopathy, ejection fraction 15%.  2. Pulmonary nodules.  3. Hypertension.   DISCHARGE MEDICATIONS:  1. Aspirin 81 mg p.o. daily.  2. Lisinopril 2.5 mg p.o. b.i.d.  3. Furosemide 40 mg p.o. daily.  4. Bisoprolol 2.5 mg p.o. daily.   PROCEDURES:  1. Chest x-ray dated April 09, 2006.  This showed cardiac enlargement      with vascular congestion, but no overt pulmonary edema.  No focal      infiltrates or effusions.  2. Chest CT angiogram dated April 10, 2006.  This showed no CT evidence      of pulmonary emboli. There was cardiac enlargement and small amount of      pericardial fluid, slightly prominent mediastinal and hilar lymph      nodes.  Several bilateral pulmonary nodules.  Further evaluation and      follow-up suggested.  Peribronchial thickening secondary to reactive      airway disease or bronchitis.  Patchy right upper lobe and right lower      lobe infiltrates.  No edema or effusions.   CONSULTATIONS:  1. Duke Salvia, MD, [facility], cardiologist.  2. Rollene Rotunda, MD, [facility], cardiologist.  3. Barbaraann Share, MD,FCCP, pulmonologist.   ADMISSION HISTORY:  As in H&P notes of April 09, 2006, dictated by Dr. Madaline Savage.  However, in brief, this is a 65 year old male, with past medical  history significant only for glaucoma, who presents with shortness of breath  and cough.  Apparently he had been seen by his primary M.D. a few days prior  to presentation,  diagnosed with a pneumonia on the basis of a chest x-ray  and treated with a course of antibiotics including Doxycycline and Avelox.  The day prior to presentation, he developed increasing bilateral lower  extremity edema.  He called his PMD, who referred him to the emergency  department and he was subsequently admitted for further evaluation,  investigation and management.   CLINICAL COURSE:  PROBLEM #1 -  DECOMPENSATED CONGESTIVE HEART FAILURE  SECONDARY TO NONISCHEMIC CARDIOMYOPATHY:  For details of presentation, refer  to above mentioned admission history.  Physical examination demonstrated  bilateral wheezes.  D-dimer was elevated at 3.68. BNP was significantly  elevated at 1700.  Chest x-ray showed cardiomegaly, but no edema.  Patient  was therefore commenced on a combination of Lasix, ACE inhibitors and  cardiology consultation was called, which was kindly provided by Dr. Rollene Rotunda.  He subsequently underwent cardiac catheterization on April 13, 2006, which showed the following hemodynamics:  RA mean 6, RV 38/5,  PA 39/22  with mean of 29.  Pulmonary capillary wedge pressure mean 18, LV 92/21, AO  92/70. Cardiac output/cardiac index 2.7/1.43.  Coronary angiogram showed  normal left main, LAD with proximal tandem 25% lesion and mid 50% stenosis.  Circumflex and AV groove proximal 25% and mid 25%.  Mid obtuse marginal was  free of  high grade disease.  Right coronary artery was dominant.  Left  ventriculogram showed an EF of approximately 15% with global hypokinesis.  The impression was nonobstructive coronary artery disease and severe  nonischemic cardiomyopathy.  The recommendation was aggressive education,  extensive medical management.  Prognosis was deemed to be poor.  Patient,  responded to a combination of diuretics and ACE inhibitor and beta-blockade  and by April 13, 2006, was deemed to be compensated.   PROBLEM #2 -  HYPERTENSION:  This was likely contributory to #1  above and  was adequately controlled with diuretics, ACE inhibitor and beta-blockade.   PROBLEM #3 -  PULMONARY NODULES:  As mentioned above, patient presented with  an elevated D-dimer.  He subsequently underwent chest CT angiogram, which  showed cardiac enlargement, several bilateral pulmonary nodules, patchy  right upper lobe and right lower lobe infiltrates.  No evidence of pulmonary  emboli.  Pulmonary consultation was called, which was kindly provided by Dr.  Marcelyn Bruins who evaluated patient and discussed various options of  management with him, including the option of a VATS procedure versus  fiberoptic bronchoscopy, versus surveillance.  Possible complications of the  above mentioned procedures were discussed with patient in detail, including  increased risk for VATS with cardiomegaly and increased risk for bleeding  with transbronchial lung biopsy by bronchoscopy against background of  cardiomegaly and chronic congestive heart failure.  Patient eventually opted  for follow-up with contrast CT scan in six weeks and subsequent follow-up in  eight weeks with Dr. Shelle Iron.  At that time, further management will be  discussed.   DISPOSITION:  Patient was discharged in satisfactory condition on April 14, 2006, by Dr. Miachel Roux L. Robb Matar.   DIET:  Healthy-heart diet.   ACTIVITY:  Recommended to increase activity slowly.   WOUND CARE:  Not applicable.   PAIN MANAGEMENT:  Not applicable.   FOLLOW UP INSTRUCTIONS:  1. Patient is instructed to follow up with Dr. Marcelyn Bruins in eight      weeks, telephone number [phone].  2. Rollene Rotunda, MD, [facility], on April 29, 2006, at 2:00      p.m.  Telephone number [phone].  3. A contrast CT scan of the chest has been arranged for six weeks;      Monday, May 24, 2006, at 8:30 a.m.  All this was communicated to      patient.  He verbalized understanding.  He has been strongly     recommended to quit smoking and has  been counseled appropriately.      Isidor Holts, M.D.  Electronically Signed     CO/MEDQ  D:  05/16/2006  T:  05/17/2006  Job:  [id number]   cc:   Sharlot Gowda, M.D.  Fax: [phone]   Barbaraann Share, MD,FCCP  [address]  [place]  [place] [address]   Rollene Rotunda, MD, [facility]  [address]  [address]  [place]  [place] [address]

## 2011-01-09 NOTE — Assessment & Plan Note (Signed)
[facility]                               PULMONARY OFFICE NOTE   NAME:RORIEJestin, Burbach                        MRN:          [id number]  DATE:05/11/2006                            DOB:          March 12, 1946    SUBJECTIVE:  Mr. Mckey comes in today after his recent hospitalization where  he was found to have a nonischemic cardiomyopathy and congestive heart  failure.  During that time, he had a CT scan of the chest to rule out  pulmonary emboli and was found, incidentally, to have multiple very small  pulmonary nodules of unknown etiology and lymphadenopathy that was minimal  but at the upper limits of normal.  It was felt at that time that Mr. Bless  just have close followup.  He comes in today where he is doing much better  since being on an aggressive cardiac regimen.  He is having no significant  cough or mucus production.  He is scheduled to have a followup CT scan in  October.   PHYSICAL EXAMINATION:  GENERAL:  He is thin, black male in no acute  distress.  Blood pressure 100/60.  Pulse 60.  Temperature 97.5.  Weight is 155 pounds.  He is 5 feet 10-1/2 inches tall.  Oxygen saturation on room air is 95%.  Chest is totally clear.   IMPRESSION:  Tiny pulmonary nodules bilaterally of unknown etiology and  associated with lymphadenopathy that is at the upper limits of normal.  This  most likely is inflammatory in nature and may resolve on its own.  Certainly, we can consider the diagnosis of sarcoidosis as well.  We also  need to make sure that his health maintenance is up to date, although I  doubt this represents a metastatic process.   PLAN:  1. I would ask that Dr. Susann Givens make sure the patient's health maintenance      is up to date including prostate and bowel evaluation.  2. The patient has a CT scan followup scheduled for May 24, 2006, and      we will formulate a plan at that point.  3. The patient will followup after his CT scan has  been done and formal      plan has been established.                                   Barbaraann Share, MD,FCCP   [facility]/MedQ  DD:  05/11/2006  DT:  05/12/2006  Job #:  [id number]   cc:   Sharlot Gowda, M.D.

## 2011-01-09 NOTE — Assessment & Plan Note (Signed)
[name]                            CARDIOLOGY OFFICE NOTE   Robert, Best                        MRN:          [id number]  DATE:09/29/2006                            DOB:          [birth year]    CARDIOLOGIST:  Dr. Rollene Best.   PRIMARY CARE PHYSICIAN:  Dr. Sharlot Best.   HISTORY OF PRESENT ILLNESS:  [name] is a very pleasant 65 year old  male patient with a history of nonischemic cardiomyopathy and  nonobstructive coronary artery disease with an EF of 15% who presents to  the office today for preoperative clearance.  He has a chronic injury to  his left clavicle and needs to have this repaired surgically, the date  is to be determined.  He denies any chest pain or shortness of breath.  Denies any syncope or near syncope.  His functional status is good.  He  really describes New York Heart Association Class II symptoms.  He  denies any orthopnea or paroxysmal nocturnal dyspnea.  He denies any  syncope.  He has had a cough at night when he lays down.  This is  nonproductive.  He denies any hemoptysis.  He denies any weight gain or  swelling.   CURRENT MEDICATIONS:  1. Aspirin 81 mg daily.  2. Furosemide 40 mg daily.  3. Travatan eye drops.  4. Cosopt eye drops.  5. Lisinopril 5 mg twice daily.  6. Bisoprolol 5 mg two tablets nightly.   ALLERGIES:  NO KNOWN DRUG ALLERGIES.   SOCIAL HISTORY:  Continues to smoke cigarettes.   REVIEW OF SYSTEMS:  Please see HPI.  Denies any fevers, chills, melena,  hematochezia, hematuria.  Rest of review of systems are negative.   PHYSICAL EXAMINATION:  He is a well-nourished, well-developed male in no  distress.  Blood pressure 116/72, pulse 57.  Weight 175 pounds, this is  up 2 pounds since his last visit.  HEAD:  Normocephalic/atraumatic.  EYES:  PERRLA/EOMI, sclerae clear.  NECK:  Without JVD.  LYMPH:  Without lymphadenopathy.  CARDIAC:  Normal S1, S2, regular rate and rhythm without  murmurs.  There  is no S3.  LUNGS:  Clear to auscultation bilaterally without wheezing, rhonchi, or  rales.  ABDOMEN:  Soft, nontender with normoactive bowel sounds, no  organomegaly.  EXTREMITIES:  Without edema, calves are soft, nontender.  NEUROLOGIC:  He is alert and oriented x3, cranial nerves II-XII grossly  intact.  Electrocardiogram reveals sinus bradycardia with heart rate of 55, left  axis deviation with inferior Q waves.  No significant change since  previous tracings.   IMPRESSION:  1. Nonischemic cardiomyopathy with an ejection fraction of 15%.  2. Chronic systolic congestive heart failure - stable.  3. Nonobstructive coronary artery disease by catheterization August      2007.      a.     Left anterior descending proximal 25%, mid 50%.  Circumflex       in the AV groove 25% proximal followed by mid 25%.  4. Hypertension.  5. Tobacco abuse.  6. Glaucoma.  7. History of pulmonary nodules, followed  by Dr. Susann Givens and Dr.      Shelle Iron.   PLAN:  The patient presents to the office today for preoperative  clearance.  He had a fairly extensive workup back in the summer of last  year.  His cardiac catheterization revealed nonobstructive coronary  disease at that time.  He is not having any symptoms of chest pain or  shortness of breath.  On exam he is optivolemic.  He has had a little  bit of a cough recently when he lays down.  I have asked him to continue  to watch his weights and should he have any increasing weight or other  signs of volume overload he should take an extra Lasix and notify our  office.  As far as the surgery goes, according to [facility] and AHA guidelines  he requires no further workup prior to his noncardiac surgery and he  should be at acceptable risk.  He is however at an increased risk for  volume overload and close attention should be paid toward his volume  status.  He should be monitored on telemetry postoperatively and he  should have strict I's and  O's.  Our service will certainly be available  in the perioperative period and we should be called with questions as  necessary.  The patient will follow up in the office with Dr. Antoine Best  as scheduled.      Robert Newcomer, PA-C  Electronically Signed      Robert Rotunda, MD, [facility]  Electronically Signed   SW/MedQ  DD: 09/29/2006  DT: 09/29/2006  Job #: [id number]   cc:   [name], M.D.  Feliberto Gottron. Turner Daniels, M.D.

## 2011-01-09 NOTE — Assessment & Plan Note (Signed)
[facility]                          CHRONIC HEART FAILURE NOTE   NAME:Best, Robert                        MRN:          [id number]  DATE:11/02/2006                            DOB:          Jun 23, 1946    PRIMARY CARE PHYSICIAN:  Dr. Sharlot Gowda.   ORTHOPEDIST:  Dr. Gean Birchwood.   [name] returns today for followup of his congestive heart failure  which is secondary to nonischemic cardiomyopathy with ejection fraction  of 15% by cardiac catheterization. [name] just underwent an  orthopedic surgery to his left shoulder by Dr. Turner Daniels, on February the  20th. Patient states he did real well, had no complications with the  surgery it's self. His left shoulder is still in a sling and has limited  range of motion with that. He states he is not having to take any pain  medication, denying any shortness of breath, orthopnea, or PND. States  actually he feels very good.   PAST MEDICAL HISTORY:  Includes;  1. Congestive heart failure secondary to nonischemic cardiomyopathy,      status post cardiac catheterization in 2007 showing nonobstructive      disease with an ejection fraction of 15% with global hypokinesis.  2. Hypertension.  3. History of medical noncompliance.  4. Ongoing tobacco use.  5. Glaucoma.   MEDICATIONS:  Include;  1. Aspirin 81.  2. Furosemide 40.  3. Travatan.  4. Cosopt eye drops.  5. Lisinopril 5 mg b.i.d.  6. Bisoprolol/hydrochlorothiazide 10/6.25 mg daily.   REVIEW OF SYSTEMS:  As stated above.   PHYSICAL EXAMINATION:  Weight 171 pounds, blood pressure 116/73 with a  pulse of 57. [name] is in no acute distress, left arm immobilized with  sling. Neck veins are flat.  LUNGS: Clear to auscultation bilaterally.  CARDIOVASCULAR EXAM: Reveals an S1, S2, regular rate and rhythm.  ABDOMEN: Soft, nontender, positive bowel sounds.  LOWER EXTREMITIES: Without clubbing, cyanosis, or edema.   IMPRESSION:  Stable heart failure  at this time, no signs of volume  overload. I am going to have patient decrease his furosemide to 20 mg  daily, continue bisoprolol and hydrochlorothiazide at current dose.  Patient once again encouraged to discontinue all tobacco products. I am  going to have patient's echo repeated in 6 weeks and I will see him back  in 8 weeks, sooner if he has any problems.   PRIMARY CARDIOLOGIST:  Dr. Antoine Poche.      [name], ACNP  Electronically Signed      Robert Rotunda, MD, [facility]  Electronically Signed   [name]  DD: 11/02/2006  DT: 11/04/2006  Job #: [id number]   cc:   Sharlot Gowda, M.D.  Feliberto Gottron. Turner Daniels, M.D.

## 2011-01-09 NOTE — Assessment & Plan Note (Signed)
[facility]                          CHRONIC HEART FAILURE NOTE   NAME:RORIEAubry, Tucholski                        MRN:          [id number]  DATE:10/11/2006                            DOB:          1946-08-16    Mr. Reaney returns today for follow concerning his congestive heart  failure, which is secondary to nonischemic cardiomyopathy with a  ejection fraction of 15% by cardiac catheterization.  Mr. Hubbert states  that he has been doing quite well since I last saw him.  He denies any  shortness at breath at rest.  He continues to walk one mile a day  without dyspnea on exertion.  Denies any orthopnea or PND.  Continues to  smoke approximately one half pack of cigarettes a day.  He has had some  trouble with his left shoulder and is actually scheduled for orthopedic  surgery this Wednesday.  He was seen here on the 2nd for preoperative  clearance by Tereso Newcomer.  Otherwise, Mr. Gracey is in his usual state  of health.  Maintains class 2 New York Heart Association symptoms.   PAST MEDICAL HISTORY:  1. Congestive heart failure secondary to non ischemic cardiomyopathy,      status post cardiac catheterization in 2007 showing non obstructive      disease with a ejection fraction of 15% with global hypokinesis.  A      2-D echocardiogram also done at that time showed a ejection      fraction of 10%-15% with mild mitral valve regurgitation.  2. Hypertension.  3. History of medical non compliance.  4. Ongoing tobacco use.  5. Glaucoma.   REVIEW OF SYSTEMS:  As stated above.   CURRENT MEDICATIONS:  1. Aspirin 81 mg daily.  2. Furosemide 40 mg daily.  3. Travatan eye drops daily.  4. Cosopt eye drops b.i.d.  5. Lisinopril 5 mg b.i.d.  6. Bisoprolol 10 mg/HCTZ 6.25 mg daily.   PHYSICAL EXAMINATION:  Weight today 176 pounds, weight is up 1 pound  from 2 weeks ago, blood pressure in the left arm 155/85, manual blood  pressure in the right arm 140/90 with a  heart rate of 56.  Mr. Markson is in no acute distress.  No jugular vein distension at 45 degree angle.  LUNGS:  Clear to auscultation bilaterally.  CARDIOVASCULAR:  Reveals a S1 and S2, regular rate and rhythm.  ABDOMEN:  Soft, nontender with positive bowel sounds.  LOWER EXTREMITIES:  Without clubbing, cyanosis or edema.  NEUROLOGICALLY:  The patient is alert and orientated x 3.  Cranial  nerves 2 through 12 grossly intact.   IMPRESSION:  Stable class 2 heart failure symptoms at this time.  No  signs of volume overload.  The patient cleared for orthopedic surgery  this Wednesday.  He is mildly hypertensive today.  Apparently he had had  a request for a refill on his bisoprolol called in.  It was called in  without the HCTZ.  Mr. Mairena noticed his blood pressure has been going  up since he has just been taking the bisoprolol alone.  I  have written  him a new prescription for the bisoprolol with the HCTZ 6.25 mg.  The  patient is to return to this combination.  If his blood pressure remains  elevated at next visit I will increase his lisinopril or consider adding  amlodipine in the future if needed.  However, we will leave medications  as they are at this time and see patient back after his shoulder surgery  this week.      [name], ACNP  Electronically Signed      Rollene Rotunda, MD, [name]  Electronically Signed   MB/MedQ  DD: 10/11/2006  DT: 10/11/2006  Job #: [id number]   cc:   Sharlot Gowda, M.D.  Feliberto Gottron. Turner Daniels, M.D.

## 2011-01-09 NOTE — Assessment & Plan Note (Signed)
[facility]                              CARDIOLOGY OFFICE NOTE   [name], [name]                        MRN:          [id number]  DATE:06/03/2006                            DOB:          1945-09-26    REASON FOR PRESENTATION:  Patient with cardiomyopathy.   HISTORY OF PRESENT ILLNESS:  The patient returns for titration of his  medications with his nonischemic cardiomyopathy.  At the last visit in the  heart failure clinic he had his bisoprolol increased to 5 mg.  I am not  clear what the dose of hydrochlorothiazide is on this.  He did not bring his  medicines.  He did well with this.  He has had no shortness of breath,  denies any PND or orthopnea.  He is having palpitations, no presyncope or  syncope.  He is having some trouble watching his salt.   PAST MEDICAL HISTORY:  Nonischemic cardiomyopathy (EF 10-15%), pulmonary  nodules followed by Dr. Shelle Iron, ongoing tobacco use, glaucoma, nonsustained  ventricular tachycardia.   ALLERGIES:  NONE.   MEDICATIONS:  1. Aspirin 81 mg daily.  2. Lisinopril 2.5 mg b.i.d.  3. Furosemide 40 mg a day.  4. Bisoprolol/HCT 5 mg daily.  5. Travatan and Cosopt eye drops.   REVIEW OF SYSTEMS:  As stated in the HPI and otherwise negative for other  systems.   PHYSICAL EXAMINATION:  GENERAL:  The patient is in no distress.  VITAL SIGNS:  Blood pressure 122/73, heart rate 53 and regular, weight 159  pounds, body mass index 23.  NECK:  No jugular venous distention at 45 degrees, carotid upstroke brisk  and symmetrical, no bruits, no thyromegaly.  LYMPHATICS:  No adenopathy.  LUNGS:  Clear to auscultation bilaterally.  BACK:  No costovertebral angle tenderness.  CHEST:  Unremarkable.  HEART:  PMI not displaced or sustained, S1 and S2 within normal limits, no  S3, no S4, no murmurs.  ABDOMEN:  Flat, positive bowel sounds, normal in frequency and pitch, no  bruits, rebound, guarding, no midline pulsatile  mass, no organomegaly.  SKIN:  No rashes, no nodules.  EXTREMITIES:  2+ pulse, no edema.   ELECTROCARDIOGRAM:  Sinus bradycardia, rate 53, left axis deviation, poor  anterior R wave progression with Q wave suggestive of an old anterior septal  myocardial infarction, lateral T wave changes without old EKGs for  comparison.   ASSESSMENT AND PLAN:  Dilated cardiomyopathy with class I heart failure  symptoms.  Today I will titrate his lisinopril to 5 mg b.i.d.  He knows to  bring his bottles back next time so we can be absolutely sure what he is  taking.  We talked about salt restriction again.   FOLLOW UP:  He is going to come back to the heart failure clinic in 1 month  and will need a BMET at that time.  He says he did have blood work by Dr.  Susann Givens at the end of September.            ______________________________  Rollene Rotunda, MD, [facility]  [facility]/MedQ  DD:  06/03/2006  DT:  06/04/2006  Job #:  [id number]   cc:   Sharlot Gowda, M.D.

## 2011-01-09 NOTE — H&P (Signed)
NAMEDEMONT, LINFORD NO.:  [id number]   MEDICAL RECORD NO.:  [id number]          PATIENT TYPE:  INP   LOCATION:  0163                         FACILITY:  [facility]   PHYSICIAN:  Madaline Savage, MD        DATE OF BIRTH:  Jan 05, 1946   DATE OF ADMISSION:  04/09/2006  DATE OF DISCHARGE:                                HISTORY & PHYSICAL   CHIEF COMPLAINT:  Shortness of breath and cough.   HISTORY OF PRESENT ILLNESS:  [name] is a 65 year old African American  male with a history of glaucoma, who comes with shortness of breath and  cough.  He initially went to his primary care physician, Dr. Sharlot Gowda,  with shortness of breath and cough for a few days.  Apparently, he had an x-  ray of his chest taken at that time which showed a pneumonia and he started  on doxycycline.  After a day, he did not improve; he called his primary care  doctor again and his antibiotic was changed to Avelox.  He did not have any  complaints, other than some shortness of breath and cough.  He denied any  fever or chills.  He denied any chest pain.  Now for the last 1 day, he has  also noticed swelling in both feet, the left greater than the right, and so  he decided to call his primary care doctor again, who advised him to come to  the ER.  No other complaints at this point of time.   PAST MEDICAL HISTORY:  Significant for glaucoma, for which he is on eye  drops.  No other medical problems.   PAST SURGICAL HISTORY:  None.   ALLERGIES:  No known drug allergies.   CURRENT MEDICATIONS:  He is on some eye drops for glaucoma; he does not  remember them.   SOCIAL HISTORY:  He smokes approximately a half a pack per day and he has  smoked for approximately 20 years.  He has quit smoking in the past, but he  has restarted smoking recently.  No history of alcohol abuse.  He states he  takes alcohol occasionally; he is definitely not a daily drinker.  He denies  any drug abuse.   FAMILY HISTORY:   No history of any cardiac problems in the family.   REVIEW OF SYSTEMS:  GENERAL:  He denies any recent weight loss or weight  gain.  No fever or chills.  HEENT:  No headaches.  No blurred vision.  CARDIOVASCULAR:  Denies chest pain or palpitations.  RESPIRATORY:  He does  have cough and shortness of breath, but the cough is a dry cough.  GI:  No  abdominal pain, nausea, vomiting, diarrhea or constipation.  EXTREMITIES:  No tingling or numbness in toes or fingers.   PHYSICAL EXAMINATION:  GENERAL:  He is alert and oriented x3.  VITAL SIGNS:  Temperature 97, pulse rate of 80, blood pressure 141/100,  respirations 18, oxygen saturation 99% on room air.  HEENT:  Head atraumatic, normocephalic.  Pupils bilaterally equal, round and  reactive  to light.  Mucous membranes moist.  NECK:  Supple with no JVD.  No carotid bruit.  CARDIOVASCULAR:  S1 and S2 heard, regular rate and rhythm.  CHEST:  Bilateral wheezing heard, which was scattered.  ABDOMEN:  Soft.  Bowel sounds heard.  EXTREMITIES:  Pitting edema 1+ bilaterally.   LABORATORY AND ACCESSORY CLINICAL DATA:  Labs show a white count of 5.7,  hemoglobin 15.5, platelets 187,000.  D-dimer is elevated at 3.68.  BMP  showed a sodium of 133 and potassium 3.9.  Creatinine was 1.3.  AST was  elevated at 50, ALT was 49, alkaline phosphatase as 85, total bilirubin  elevated at 1.8.  A BNP was elevated at 1700.   X-ray of the chest showed cardiomegaly, no pneumonia.   Urinalysis showed nitrite positive, wbc's 7-10.   CT of the chest was ordered for the positive D-dimer, which showed no PE.  Several bilateral pulmonary nodules need followup.  It also showed patchy  right upper lobe and lower lobe infiltrates.   IMPRESSION:  1. Congestive cardiac failure.  2. Elevated liver enzymes.   PLAN:  This is a 65 year old gentleman with no significant past medical  history, coming in with elevated BNP and x-ray just showing cardiomegaly.  He also had  shortness of breath and cough.  He denies any orthopnea or  paroxysmal nocturnal dyspnea.  He did have bilateral pitting pretibial  edema.  He most likely has congestive cardiac failure.  We will get an  echocardiogram on him in the morning.  He was given some IV Lasix.  We will  put him on p.o. Lasix 40 mg twice daily.  We will need to follow his  potassium closely.  We will also put him on some aerosol treatments.  We  will check serial cardiac enzymes and an EKG.  We will start him on low-dose  aspirin and a low dose of lisinopril.  If he does have congestive cardiac  failure, he does need to be started on a beta blocker, once the acute phase  is over.  He also probably needs further cardiac workup to find the cause of  his heart failure.  He also has elevated liver enzymes which are mildly  elevated.  He denies any strong history of alcoholism.  We will check a  viral hepatitis on him.  His urinalysis shows nitrite positive at 7-10 white  cells.  We will repeat a urinalysis in the morning.  I will put him on DVT  prophylaxis.      Madaline Savage, MD  Electronically Signed     PKN/MEDQ  D:  04/10/2006  T:  04/10/2006  Job:  [id number]

## 2011-01-09 NOTE — Assessment & Plan Note (Signed)
[facility]                          CHRONIC HEART FAILURE NOTE   NAME:RORIEJamarii, [name]                        MRN:          [id number]  DATE:09/03/2006                            DOB:          Jun 29, 1946    ADDENDUM  Followup CT scan of the chest was done May 24, 2006 showing interval  resolution of the right upper lobe airspace disease and interval  improvement in scattered tiny pulmonary nodules consistent with  resolving infectious/inflammatory process.  CT results were reviewed by  Dr. Shelle Iron, how recommended no further workup needed at this time.      [name], ACNP       [name], MD    MB/MedQ  DD: 09/03/2006  DT: 09/03/2006  Job #: [id number]   cc:   Sharlot Gowda, M.D.

## 2011-01-09 NOTE — Assessment & Plan Note (Signed)
[name]                   COUMADIN / CHRONIC HEART FAILURE CLINIC NOTE   YAKUB, LODES                        MRN:          [id number]  DATE:04/29/2006                            DOB:          March 10, 1946    PRIMARY CARE PHYSICIAN:  Sharlot Gowda, M.D.   [name] is a new patient to the heart failure clinic.  He was recently  discharged from [facility] status post cardiac catheterization  including left and right heart cath secondary to increased shortness of  breath/congestive heart failure.  Cardiac catheterization showed  nonobstructive coronary heart disease with severe nonischemic  cardiomyopathy.  [name] is a 65 year old African-American gentleman with  history of glaucoma and uncontrolled hypertension with medical concompliance  to hypertensive medications who was admitted on 04/09/2006 secondary to  shortness of breath and coughing.  [name] also has a history of ongoing  tobacco abuse.  Initial lab work showed a BNP of 1700, creatinine 1.3.  Chest x-ray showed cardiomegaly without pneumonia.  The patient had an  elevated D-dimer and subsequently underwent a CT of the chest which showed  no pulmonary embolism,  however, he was noted to have several bilateral  pulmonary nodules which need followup.  Followup of abnormal CT scan needs  to be done by primary care physician.   The patient was seen by Dr. Graciela Husbands in consultation regarding the heart  failure.  The patient was started on medications appropriate for his  diagnosis.  A 2D echocardiogram was also ordered, results of which are not  available at this time.  The patient responded well to diuretics.  The  patient also had episode of nonsustained VTach during hospitalization.  I do  not have any rhythm strips or EKGs to confirm this.  The patient did well  and was discharged home on 04/14/2006 following his cardiac catheterization.  [name] states he has felt good  since he went home, denied any shortness  of breath, complains of mild fatigue.  Denied any orthopnea or PND,  peripheral edema. Appetite has been good, sleeping well.  No episodes of  chest discomfort.  The patient states he is compliant with his medications.   PAST MEDICAL HISTORY:  1. Hypertension with poor control secondary to patient noncompliance.  2. Ongoing tobacco abuse.  The patient states he is down to two cigarettes      a day.  3. Glaucoma with medication use.  4. Recent CT scan of the chest showing several bilateral pulmonary nodules      which will need further followup.  5. Congestive heart failure secondary to nonischemic cardiomyopathy status      post cardiac catheterization showing nonobstructive coronary artery      disease with severe nonischemic cardiomyopathy with an ejection      fraction of 15% with global hypokinesis.   REVIEW OF SYSTEMS:  As stated above in the history of present illness,  otherwise negative.   CURRENT MEDICATIONS:  1. Aspirin 81 mg daily.  2. Lisinopril 2.5 mg b.i.d.  3. Furosemide 40 mg daily.  4. Bisoprolol 2.5 mg/HCTZ 3.75 mg.  5.  Travatan and Cosopt eye drops for his  glaucoma.   PHYSICAL EXAMINATION:  VITAL SIGNS:  Weight 156, blood pressure 115/77 with  a pulse of 80.  GENERAL:  [name] is a very pleasant 65 year old African-American  gentleman in no acute distress, very pleasant and cooperative  gentleman.  HEENT:  Normocephalic, atraumatic.  Pupils equal, round and reactive to  light.  NECK:  Without jugular venous distention.  LUNGS:  Clear to auscultation, somewhat distant breath sounds in bilateral  bases.  CARDIOVASCULAR: S1 and S2, regular rate and rhythm.  No audible gallops,  murmurs or rubs.  ABDOMEN:  Soft and nontender, positive bowel sounds.  LOWER EXTREMITIES:  Without clubbing, cyanosis or edema.  NEUROLOGIC:  The patient is alert and oriented x3.  Cranial nerves II  through XII are grossly intact.    IMPRESSION:  Congestive heart failure, currently stable secondary to  nonischemic cardiomyopathy status post cardiac catheterization showing an  ejection fraction of 15%.  Will continue current medications with an  increase in the bisoprolol to 5 mg daily.  Goal is 10 mg daily.  Will  continue lisinopril and Lasix at current doses.  Will see patient back in  four weeks.  I have initiated congestive heart failure teaching with  patient.  He has also been given heart failure educational packet to take  home.  I will plan on seeing patient back every four weeks.  Will check  blood work at next visit.  Pending results of tolerance to medication and  repeat echocardiogram in a few months, patient may be a candidate for a  defibrillator.  Will continue to follow closely.                                 [name], ACNP                            Rollene Rotunda, MD, [facility]   MB/MedQ  DD:  04/29/2006  DT:  04/29/2006  Job #:  [id number]   cc:   Sharlot Gowda, M.D.

## 2011-01-09 NOTE — Assessment & Plan Note (Signed)
[facility]                            CHRONIC HEART FAILURE NOTE   ANTAWN, SISON                        MRN:          [id number]  DATE:07/14/2006                            DOB:          [birth year]    PRIMARY CARE CARDIOLOGIST:  Rollene Rotunda, MD, F.A.C.C.   PRIMARY CARE CARDIOLOGIST:  Sharlot Gowda, M.D.   HISTORY OF PRESENT ILLNESS:  [name] returns today to the heart failure  clinic for further evaluation, medication titration of his congestive heart  failure, which is secondary to nonischemic cardiomyopathy.  I last saw Mr.  Christianson in September, at which time I increased his bisoprolol from 2.5 mg to  5 mg daily.  He states he has tolerated this medication without problems,  denies any light-headedness or dizziness.  No peripheral edema.  He  continues to smoke approximately a half pack of cigarettes daily.  Denies  any shortness of breath or dyspnea on exertion.  [name] states he has  been taking his medications as ordered.  Denies any episodes of chest  discomfort.  Appetite is good.  Sleeping without problems.   PAST MEDICAL HISTORY:  1. Includes congestive heart failure, secondary to nonischemic      cardiomyopathy, with EF of 15% by cardiac catheterization.  2. Nonobstructive coronary artery disease by cardiac catheterization.  3. Hypertension, which is poorly-controlled, secondary to patient      noncompliance.  4. Ongoing tobacco abuse.  5. Glaucoma.  6. Pulmonary nodules, followed by Dr. Susann Givens and Dr. Shelle Iron.   REVIEW OF SYSTEMS:  As stated above, otherwise negative.   CURRENT MEDICATIONS INCLUDE:  1. Aspirin 81 mg daily.  2. Furosemide 40 mg daily.  3. Bisoprolol 5/HCTZ 6.25 mg daily.  4. Travatan and Cosopt eye drops.  5. Lisinopril 5 mg b.i.d.   PHYSICAL EXAMINATION:  Weight 166 pounds.  The patient has a weight increase  of 7 pounds since October.  Blood pressure 126/78 with a heart rate of 61.  [name] is  in no acute distress at this time.  No jugular venous  distention at 45 degree angle.  Lungs are clear to auscultation bilaterally.  Cardiovascular exam reveals S1, S2, regular rate and rhythm.  Abdomen soft,  nontender, positive bowel sounds.  Lower extremities without clubbing,  cyanosis or edema.   IMPRESSION:  Patient currently has Class I heart failure symptoms.  We will  continue current medications with the additional titration of his bisoprolol  to 10 mg daily.  I have asked the patient  to take this at bedtime.  I will see him back in four weeks, at which time I  will obtain a BMET, sooner if the patient has any problems.      [name], ACNP  Electronically Signed      Rollene Rotunda, MD, [name]  Electronically Signed   MB/MedQ  DD: 07/14/2006  DT: 07/14/2006  Job #: [id number]

## 2011-01-09 NOTE — Cardiovascular Report (Signed)
NAMENAITHAN, DELAGE NO.:  [id number]   MEDICAL RECORD NO.:  [id number]          PATIENT TYPE:  OUT   LOCATION:  CATH                         FACILITY:  [facility]   PHYSICIAN:  Rollene Rotunda, MD, FACCDATE OF BIRTH:  [birth year]   DATE OF PROCEDURE:  04/13/2006  DATE OF DISCHARGE:                              CARDIAC CATHETERIZATION   PRIMARY:  Dr. Sharlot Gowda   PROCEDURE:  Left and right heart catheterization/coronary angiography.   INDICATION:  Evaluate patient with congestive heart failure and pulmonary  edema.   PROCEDURE NOTE:  Left heart catheterization was performed via the right  femoral artery.  The artery was cannulated using an anterior wall puncture.  Right heart catheterization was performed via the right femoral vein. This  vessel was cannulated using an anterior wall puncture.  A #6 French arterial  sheath and #7 French venous sheath were inserted via the modified Seldinger  technique.  Preformed Judkins and a pigtail catheter were utilized.  The  patient tolerated the procedure well and left the lab with stable condition.   RESULTS:  Hemodynamics:  RA mean 6, RV 38/5, PA 39/22 with a mean of 29,  pulmonary capillary wedge pressure mean 18, LV 92/21, AO 92/70.  Cardiac  output/cardiac index 2.7/1.43.   Coronaries:  The left main was normal.  The LAD had proximal tandem 25%  lesions.  There was mid 50% stenosis.  There was a small first diagonal  which had diffuse nonobstructive disease.  The second diagonal was also  small with diffuse nonobstructive disease.  The circumflex in the AV groove  had a long proximal 25% followed by a mid 25% stenosis.  There was a large  ramus intermediate with luminal irregularities.  There was a mid obtuse  marginal which was small and branching and free of high-grade disease.  The  posterolateral was large with luminal irregularities.  The right coronary  artery is dominant though narrow-caliber 2.0 vessel.  There  was proximal  aneurysmal dilatation.  There are diffuse luminal irregularities throughout  the right including a small- to [facility] PDA.   Left ventriculogram:  The left ventriculogram was obtained in the RAO  projection.  The EF was approximately 15% with global hypokinesis.   CONCLUSION:  1. Nonobstructive coronary artery disease.  2. Severe nonischemic cardiomyopathy.   PLAN:  The patient will need extensive medical management.  He will need  aggressive education.  He will need followup in our heart failure clinic.  The etiology of this is not clear.  He reports he does not drink alcohol  very much.  He has had uncontrolled hypertension which is possibly an  etiology.  Also, this could be viral.  His prognosis is poor.           ______________________________  Rollene Rotunda, MD, [facility]     JH/MEDQ  D:  04/13/2006  T:  04/13/2006  Job:  [id number]   cc:   Sharlot Gowda, M.D.

## 2011-06-04 LAB — I-STAT 8, (EC8 V) (CONVERTED LAB)
BUN: 21
Bicarbonate: 25.1 — ABNORMAL HIGH
Chloride: 109
Glucose, Bld: 127 — ABNORMAL HIGH
HCT: 45
Hemoglobin: 15.3
Operator id: 133351
Potassium: 4.2
Sodium: 142

## 2011-06-04 LAB — POCT I-STAT CREATININE: Creatinine, Ser: 1.4

## 2011-06-04 LAB — CBC
HCT: 39.5
MCHC: 34.2
MCV: 95.7
RBC: 4.13 — ABNORMAL LOW
WBC: 7.5

## 2011-06-04 LAB — DIFFERENTIAL
Basophils Relative: 1
Eosinophils Absolute: 0.3
Eosinophils Relative: 5
Lymphs Abs: 1.7
Monocytes Absolute: 0.4
Monocytes Relative: 5
Neutrophils Relative %: 67

## 2011-06-04 LAB — POCT CARDIAC MARKERS
Operator id: 133351
Troponin i, poc: <0.05

## 2011-12-22 ENCOUNTER — Encounter: Payer: Self-pay | Admitting: *Deleted

## 2011-12-23 ENCOUNTER — Ambulatory Visit (INDEPENDENT_AMBULATORY_CARE_PROVIDER_SITE_OTHER): Payer: Medicare Other | Admitting: Cardiovascular Disease

## 2011-12-23 ENCOUNTER — Encounter: Payer: Self-pay | Admitting: Cardiovascular Disease

## 2011-12-23 VITALS — BP 110/70 | HR 61 | Ht 70.0 in | Wt 156.0 lb

## 2011-12-23 DIAGNOSIS — I509 Heart failure, unspecified: Secondary | ICD-10-CM

## 2011-12-23 DIAGNOSIS — I714 Abdominal aortic aneurysm, without rupture: Secondary | ICD-10-CM

## 2011-12-23 DIAGNOSIS — I2510 Atherosclerotic heart disease of native coronary artery without angina pectoris: Secondary | ICD-10-CM | POA: Diagnosis not present

## 2011-12-23 DIAGNOSIS — I428 Other cardiomyopathies: Secondary | ICD-10-CM | POA: Diagnosis not present

## 2011-12-23 DIAGNOSIS — I472 Ventricular tachycardia: Secondary | ICD-10-CM | POA: Diagnosis not present

## 2011-12-23 DIAGNOSIS — I10 Essential (primary) hypertension: Secondary | ICD-10-CM | POA: Diagnosis not present

## 2011-12-23 MED ORDER — LOSARTAN POTASSIUM 50 MG PO TABS: 50.0000 mg | ORAL_TABLET | Freq: Every day | ORAL | Status: DC

## 2011-12-23 NOTE — Assessment & Plan Note (Signed)
Not clearly documented. Old MI's on ECG.  NO angina.  Again will need records form [place].  Echo to reassess EF as it is not clear to me if he should be considered for AICD

## 2011-12-23 NOTE — Assessment & Plan Note (Signed)
Well controlled.  Continue current medications and low sodium Dash type diet.  S/P renal artery stenting at [facility] Will try to get records for procedure and baseline Cr

## 2011-12-23 NOTE — Assessment & Plan Note (Signed)
Describes wearing and event monitor.  No syncope  After [facility] will need to discuss ? EPS referreal

## 2011-12-23 NOTE — Assessment & Plan Note (Signed)
Euvolemic.  Change to cozaar for cough.

## 2011-12-23 NOTE — Assessment & Plan Note (Signed)
Not palpable on exam  Likely evaluated during RAS stenting.  Consider duplex of abdominal aorta after [facility] records received

## 2011-12-23 NOTE — Progress Notes (Signed)
Patient ID: [name], male   DOB: 1945/08/26, 66 y.o.   MRN: [id number] 66 yo patient of Dr Antoine Poche.  Seen for CHF  Last seen by CB 2010.  Dr Antoine Poche cleared for renal stenting at that time.  Most of his care has been at [facility].  Not clear etiology of CHF but he denies history of CAD.  Renal stents went well. He apparantly has stable kidney function and history of AAA.  He quit smoking 3 years ago.  History of VT and wore a monitor at some point but no AICD.  Having trouble sleeping ? Anxiety but no PND, orthopnea or edema.  Lisinopril causing a cough.  Gets his meds at [facility] and is compliant.  No chest pain, walks regularly.  Denies ETOH.  Volume stable.  No edema.  Will need to get records from [place] regarding Cr, AAA and w/u of CAD and VT.  No palpitations or syncope.    ROS: Denies fever, malais, weight loss, blurry vision, decreased visual acuity, cough, sputum, SOB, hemoptysis, pleuritic pain, palpitaitons, heartburn, abdominal pain, melena, lower extremity edema, claudication, or rash.  All other systems reviewed and negative   General: Affect appropriate Thin black male HEENT: normal Neck supple with no adenopathy JVP normal no bruits no thyromegaly Lungs clear with no wheezing and good diaphragmatic motion Heart:  S1/S2 no murmur,rub, gallop or click PMI increased Abdomen: benighn, BS positve, no tenderness, no AAA no bruit.  No HSM or HJR Distal pulses intact with no bruits No edema Neuro non-focal Skin warm and dry No muscular weakness  Medications Current Outpatient Prescriptions  Medication Sig Dispense Refill  . aspirin 81 MG tablet Take 81 mg by mouth daily.      . brimonidine (ALPHAGAN P) 0.1 % SOLN as directed.       . cholecalciferol (VITAMIN D) 1000 UNITS tablet Take 1,000 Units by mouth daily.      . dorzolamide (TRUSOPT) 2 % ophthalmic solution 1 drop as directed.       . furosemide (LASIX) 40 MG tablet Take 40 mg by mouth daily.      . metoprolol succinate  (TOPROL-XL) 50 MG 24 hr tablet 1/2 tab po qd      . potassium chloride SA (K-DUR,KLOR-CON) 20 MEQ tablet Take 20 mEq by mouth 2 (two) times daily.      . simvastatin (ZOCOR) 40 MG tablet Take 40 mg by mouth every evening.        Allergies Review of patient's allergies indicates no known allergies.  Family History: No family history on file.  Social History: History   Social History  . Marital Status: Single    Spouse Name: N/A    Number of Children: N/A  . Years of Education: N/A   Occupational History  . Not on file.   Social History Main Topics  . Smoking status: Former Games developer  . Smokeless tobacco: Not on file  . Alcohol Use: Not on file  . Drug Use: Not on file  . Sexually Active: Not on file   Other Topics Concern  . Not on file   Social History Narrative  . No narrative on file    Electrocardiogram:  NSR rate 61 PR 240 RBBB old IMI/Anterior MI  Assessment and Plan

## 2011-12-23 NOTE — Patient Instructions (Signed)
Your physician wants you to follow-up in:  6 MONTHS WITH DR Antoine Poche  You will receive a reminder letter in the mail two months in advance. If you don't receive a letter, please call our office to schedule the follow-up appointment. Your physician has recommended you make the following change in your medication: STOP LISINOPRIL  AND START  LOSARTAN 50 MG EVERY DAY Your physician has requested that you have an echocardiogram. Echocardiography is a painless test that uses sound waves to create images of your heart. It provides your doctor with information about the size and shape of your heart and how well your heart's chambers and valves are working. This procedure takes approximately one hour. There are no restrictions for this procedure. DX CARDIOMYOPTHY

## 2011-12-30 ENCOUNTER — Ambulatory Visit (HOSPITAL_COMMUNITY): Payer: Medicare Other | Attending: Cardiovascular Disease

## 2011-12-30 ENCOUNTER — Other Ambulatory Visit: Payer: Self-pay

## 2011-12-30 DIAGNOSIS — I2510 Atherosclerotic heart disease of native coronary artery without angina pectoris: Secondary | ICD-10-CM | POA: Diagnosis not present

## 2011-12-30 DIAGNOSIS — I509 Heart failure, unspecified: Secondary | ICD-10-CM

## 2011-12-30 DIAGNOSIS — I5020 Unspecified systolic (congestive) heart failure: Secondary | ICD-10-CM | POA: Insufficient documentation

## 2011-12-30 DIAGNOSIS — I10 Essential (primary) hypertension: Secondary | ICD-10-CM | POA: Diagnosis not present

## 2011-12-30 DIAGNOSIS — I428 Other cardiomyopathies: Secondary | ICD-10-CM | POA: Diagnosis not present

## 2012-01-07 ENCOUNTER — Telehealth: Payer: Self-pay | Admitting: Cardiology

## 2012-01-07 NOTE — Telephone Encounter (Signed)
Left message to call back for echo results. 

## 2012-01-07 NOTE — Telephone Encounter (Signed)
Fu msg Pt returning your call 

## 2012-01-08 ENCOUNTER — Telehealth: Payer: Self-pay | Admitting: Cardiology

## 2012-01-08 NOTE — Telephone Encounter (Addendum)
pt rtn call to pam re echo results ok to leave message on machine

## 2012-01-08 NOTE — Telephone Encounter (Signed)
Pt notified of echo results

## 2012-01-11 NOTE — Telephone Encounter (Signed)
Reviewed results of echo with pt and schedule him for a 2 month follow up appt.

## 2012-01-11 NOTE — Telephone Encounter (Signed)
Fu call  °Pt returning your call about test results °

## 2012-03-08 ENCOUNTER — Encounter: Payer: Self-pay | Admitting: Cardiology

## 2012-03-08 ENCOUNTER — Ambulatory Visit (INDEPENDENT_AMBULATORY_CARE_PROVIDER_SITE_OTHER): Payer: Medicare Other | Admitting: Cardiology

## 2012-03-08 VITALS — BP 115/80 | HR 68 | Ht 71.0 in | Wt 147.8 lb

## 2012-03-08 DIAGNOSIS — I714 Abdominal aortic aneurysm, without rupture, unspecified: Secondary | ICD-10-CM

## 2012-03-08 DIAGNOSIS — I472 Ventricular tachycardia, unspecified: Secondary | ICD-10-CM

## 2012-03-08 DIAGNOSIS — I10 Essential (primary) hypertension: Secondary | ICD-10-CM | POA: Diagnosis not present

## 2012-03-08 DIAGNOSIS — I429 Cardiomyopathy, unspecified: Secondary | ICD-10-CM

## 2012-03-08 DIAGNOSIS — I428 Other cardiomyopathies: Secondary | ICD-10-CM | POA: Diagnosis not present

## 2012-03-08 DIAGNOSIS — I5022 Chronic systolic (congestive) heart failure: Secondary | ICD-10-CM | POA: Diagnosis not present

## 2012-03-08 LAB — BASIC METABOLIC PANEL
CO2: 27 mEq/L (ref 19–32)
Chloride: 101 mEq/L (ref 96–112)
Creatinine, Ser: 1.5 mg/dL (ref 0.4–1.5)
Glucose, Bld: 111 mg/dL — ABNORMAL HIGH (ref 70–99)

## 2012-03-08 NOTE — Progress Notes (Signed)
HPI The patient presents for followup of a nonischemic cardiomyopathy. It has been several years since I seen him. He's had treatment at the [place] . He did see Dr.  Eden Emms recently to reestablish with [place]. Old records were requested from the [place] but we have not received these. An echocardiogram was performed with results described below. He was switched from an ACE inhibitor to an ARB because of cough. He says this is much improved. He denies any shortness of breath, PND or orthopnea. He denies any chest pressure, neck or arm discomfort. He pushes a lawnmower and can climb a flight of stairs without any difficulty and he has no palpitations, presyncope or syncope. He has had no weight gain or edema.  No Known Allergies  Current Outpatient Prescriptions  Medication Sig Dispense Refill  . aspirin 81 MG tablet Take 81 mg by mouth daily.      . cholecalciferol (VITAMIN D) 1000 UNITS tablet Take 1,000 Units by mouth daily.      . metoprolol succinate (TOPROL-XL) 50 MG 24 hr tablet 1/2 tab po qd      . potassium chloride SA (K-DUR,KLOR-CON) 20 MEQ tablet Take 20 mEq by mouth 2 (two) times daily.      . dorzolamide (TRUSOPT) 2 % ophthalmic solution 1 drop as directed.       . simvastatin (ZOCOR) 40 MG tablet Take 40 mg by mouth every evening.        Past Medical History  Diagnosis Date  . TOBACCO ABUSE     Previous  . GLAUCOMA   . HYPERTENSION   . CAD     Non obstructive (mild) cath 2007  . CHF     EF 15% in 2007, 30 - 35% currently, Class I  . ABDOMINAL AORTIC ANEURYSM     (No records from [place].  Reports stents to the renal arteries.)    Past Surgical History  Procedure Date  . Eye surgery     Glaucoma  . Corneal transplant     Right eye x 2  . Clavical repair     ROS:  As stated in the HPI and negative for all other systems.  PHYSICAL EXAM BP 115/80  Pulse 68  Ht 5\' 11"  (1.803 m)  Wt 147 lb 12.8 oz (67.042 kg)  BMI 20.61 kg/m2 GENERAL:  Well appearing HEENT:  Right lens  opacification,  fundi not visualized, oral mucosa unremarkable, poor dentition NECK:  No jugular venous distention, waveform within normal limits, carotid upstroke brisk and symmetric, no bruits, no thyromegaly LYMPHATICS:  No cervical, inguinal adenopathy LUNGS:  Clear to auscultation bilaterally BACK:  No CVA tenderness CHEST:  Unremarkable HEART:  PMI not displaced or sustained,S1 and S2 within normal limits, no S3, no S4, no clicks, no rubs, no murmurs ABD:  Flat, positive bowel sounds normal in frequency in pitch, no bruits, no rebound, no guarding, no midline pulsatile mass, no hepatomegaly, no splenomegaly EXT:  2 plus pulses throughout, no edema, no cyanosis no clubbing SKIN:  No rashes no nodules NEURO:  Cranial nerves II through XII grossly intact, motor grossly intact throughout PSYCH:  Cognitively intact, oriented to person place and time   Assessment and Plan    CARDIOMOYPATHY -  He seems to be euvolemic.  At this point, no change in therapy is indicated.  We have reviewed salt and fluid restrictions.  No further cardiovascular testing is indicated.  He was switched to Cozaar at the last visit because of cough.  His cough resolved. However, the [place] didn't have this medicine and he's not sure what he is taking. He's going to call us back and going to continue that therapy. I will be checking a basic metabolic profile.  Of note his EF remains stable at 30-35%. He has Class I CHF with a nonischemic cardiomyopathy. At this point I would not refer him for ICD placement.   ABDOMINAL AORTIC ANEURYSM -  I'm still very confused about his peripheral vascular disease anatomy and treatment. We will try again to get report from the [place].   VENTRICULAR TACHYCARDIA -  He reports that on the monitor before. However, he does recall a diagnosis of V. tach. He's had no syncope. Again we will try to get these records from the [place].  HYPERTENSION -  This is being managed in the context of treating his  CHF

## 2012-03-08 NOTE — Patient Instructions (Addendum)
The current medical regimen is effective;  continue present plan and medications.  Please have blood work today (BMP)  Please call back with the name of the medication you are taking.  Follow up in 6 months with Dr Antoine Poche (08/2012) . You will receive a letter in the mail when you are due, please call us so that we can get you scheduled.

## 2012-06-03 ENCOUNTER — Encounter: Payer: Self-pay | Admitting: Cardiology

## 2012-06-03 ENCOUNTER — Ambulatory Visit (INDEPENDENT_AMBULATORY_CARE_PROVIDER_SITE_OTHER): Payer: Medicare Other | Admitting: Cardiology

## 2012-06-03 VITALS — BP 100/62 | HR 63 | Ht 70.5 in | Wt 157.4 lb

## 2012-06-03 DIAGNOSIS — I714 Abdominal aortic aneurysm, without rupture, unspecified: Secondary | ICD-10-CM

## 2012-06-03 DIAGNOSIS — I10 Essential (primary) hypertension: Secondary | ICD-10-CM | POA: Diagnosis not present

## 2012-06-03 DIAGNOSIS — F17200 Nicotine dependence, unspecified, uncomplicated: Secondary | ICD-10-CM

## 2012-06-03 DIAGNOSIS — I472 Ventricular tachycardia, unspecified: Secondary | ICD-10-CM

## 2012-06-03 DIAGNOSIS — I509 Heart failure, unspecified: Secondary | ICD-10-CM

## 2012-06-03 DIAGNOSIS — I2510 Atherosclerotic heart disease of native coronary artery without angina pectoris: Secondary | ICD-10-CM | POA: Diagnosis not present

## 2012-06-03 NOTE — Patient Instructions (Addendum)
The current medical regimen is effective;  continue present plan and medications.  Follow up in 6 months with Dr Hochrein.  You will receive a letter in the mail 2 months before you are due.  Please call us when you receive this letter to schedule your follow up appointment.  

## 2012-06-03 NOTE — Progress Notes (Signed)
HPI The patient presents for followup of a nonischemic cardiomyopathy. Since I last saw him he has done well.  The patient denies any new symptoms such as chest discomfort, neck or arm discomfort. There has been no new shortness of breath, PND or orthopnea. There have been no reported palpitations, presyncope or syncope.  At the last visit we did need to clarify his medications and he is taking list described below and he's compliant with this. 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 He does get some claudication but this has been baseline.  No Known Allergies  Current Outpatient Prescriptions  Medication Sig Dispense Refill  . aspirin 81 MG tablet Take 81 mg by mouth daily.      . cholecalciferol (VITAMIN D) 1000 UNITS tablet Take 1,000 Units by mouth 2 (two) times daily.       . dorzolamide (TRUSOPT) 2 % ophthalmic solution 1 drop as directed.       Marland Kitchen losartan (COZAAR) 50 MG tablet Take 50 mg by mouth daily.      . metoprolol succinate (TOPROL-XL) 50 MG 24 hr tablet Take 50 mg by mouth daily.       . potassium chloride SA (K-DUR,KLOR-CON) 20 MEQ tablet Take 20 mEq by mouth 2 (two) times daily.      . simvastatin (ZOCOR) 40 MG tablet Take 40 mg by mouth every evening.        Past Medical History  Diagnosis Date  . TOBACCO ABUSE     Previous  . GLAUCOMA   . HYPERTENSION   . CAD     Non obstructive (mild) cath 2007  . CHF     EF 15% in 2007, 30 - 35% currently, Class I  . ABDOMINAL AORTIC ANEURYSM     (No records from [place].  Reports stents to the renal arteries.)    Past Surgical History  Procedure Date  . Eye surgery     Glaucoma  . Corneal transplant     Right eye x 2  . Clavical repair     ROS:  As stated in the HPI and negative for all other systems.  PHYSICAL EXAM BP 100/62  Pulse 63  Ht 5' 10.5" (1.791 m)  Wt 71.396 kg (157 lb 6.4 oz)  BMI 22.27  kg/m2 GENERAL:  Well appearing HEENT:  Right lens opacification,  fundi not visualized, oral mucosa unremarkable, poor dentition NECK:  No jugular venous distention, waveform within normal limits, carotid upstroke brisk and symmetric, no bruits, no thyromegaly LUNGS:  Clear to auscultation bilaterally BACK:  No CVA tenderness CHEST:  Unremarkable HEART:  PMI not displaced or sustained,S1 and S2 within normal limits, no S3, no S4, no clicks, no rubs, no murmurs ABD:  Flat, positive bowel sounds normal in frequency in pitch, no bruits, no rebound, no guarding, no midline pulsatile mass, no hepatomegaly, no splenomegaly EXT:  2 plus pulses upper, absent dorsalis pedis, posterior tibialis and popliteals bilaterally, fifth sixth no edema, no cyanosis no clubbing   Assessment and Plan    CARDIOMOYPATHY -  He seems to be euvolemic.  His blood pressure will not allow med titration. We have reviewed salt and fluid restrictions.  No further cardiovascular testing is indicated.  Of note his EF remains stable at 30-35%. He has Class I CHF with a nonischemic cardiomyopathy. At this point I would  not refer him for ICD placement.   PVD -  He describes a renal bypass. He says he is not having history of an abdominal aortic aneurysm. At this point no change in therapy is indicated. He needs continued risk reduction.  VENTRICULAR TACHYCARDIA -  He has had no symptomatic tachypalpitations. No further evaluation is warranted. He will continue the meds as listed.  HYPERTENSION -  This is being managed in the context of treating his CHF

## 2012-11-24 ENCOUNTER — Ambulatory Visit (INDEPENDENT_AMBULATORY_CARE_PROVIDER_SITE_OTHER): Payer: Medicare Other | Admitting: Cardiology

## 2012-11-24 ENCOUNTER — Encounter: Payer: Self-pay | Admitting: Cardiology

## 2012-11-24 VITALS — BP 124/62 | HR 63 | Ht 70.5 in | Wt 161.0 lb

## 2012-11-24 DIAGNOSIS — I5022 Chronic systolic (congestive) heart failure: Secondary | ICD-10-CM | POA: Diagnosis not present

## 2012-11-24 DIAGNOSIS — I472 Ventricular tachycardia: Secondary | ICD-10-CM | POA: Diagnosis not present

## 2012-11-24 DIAGNOSIS — I714 Abdominal aortic aneurysm, without rupture: Secondary | ICD-10-CM

## 2012-11-24 DIAGNOSIS — I10 Essential (primary) hypertension: Secondary | ICD-10-CM

## 2012-11-24 MED ORDER — METOPROLOL SUCCINATE ER 50 MG PO TB24: 50.0000 mg | ORAL_TABLET | Freq: Every day | ORAL | Status: DC

## 2012-11-24 MED ORDER — METOPROLOL SUCCINATE ER 25 MG PO TB24: 25.0000 mg | ORAL_TABLET | Freq: Every day | ORAL | Status: DC

## 2012-11-24 NOTE — Progress Notes (Signed)
HPI The patient presents for followup of a nonischemic cardiomyopathy. Since I last saw him he has done well.  The patient denies any new symptoms such as chest discomfort, neck or arm discomfort. There has been no new shortness of breath, PND or orthopnea. There have been no reported palpitations, presyncope or syncope.  He continues to follow closely and routinely but the [facility]. He reports that they recently did ABIs. He continues to have some claudication but this is stable.   No Known Allergies  Current Outpatient Prescriptions  Medication Sig Dispense Refill  . aspirin 81 MG tablet Take 81 mg by mouth daily.      . cholecalciferol (VITAMIN D) 1000 UNITS tablet Take 1,000 Units by mouth 2 (two) times daily.       Marland Kitchen losartan (COZAAR) 50 MG tablet Take 50 mg by mouth daily.      . metoprolol succinate (TOPROL-XL) 50 MG 24 hr tablet Take 50 mg by mouth daily.       . potassium chloride SA (K-DUR,KLOR-CON) 20 MEQ tablet Take 20 mEq by mouth 2 (two) times daily.      . simvastatin (ZOCOR) 40 MG tablet Take 40 mg by mouth every evening.       No current facility-administered medications for this visit.    Past Medical History  Diagnosis Date  . TOBACCO ABUSE     Previous  . GLAUCOMA   . HYPERTENSION   . CAD     Non obstructive (mild) cath 2007  . CHF     EF 15% in 2007, 30 - 35% currently, Class I  . ABDOMINAL AORTIC ANEURYSM     (No records from [place].  Reports stents to the renal arteries.)    Past Surgical History  Procedure Laterality Date  . Eye surgery      Glaucoma  . Corneal transplant      Right eye x 2  . Clavical repair      ROS:  As stated in the HPI and negative for all other systems.  PHYSICAL EXAM BP 124/62  Pulse 63  Ht 5' 10.5" (1.791 m)  Wt 161 lb (73.029 kg)  BMI 22.77 kg/m2 GENERAL:  Well appearing HEENT:  Right lens opacification,  fundi not visualized, oral mucosa unremarkable, poor dentition NECK:  No jugular venous distention,  waveform within normal limits, carotid upstroke brisk and symmetric, no bruits, no thyromegaly LUNGS:  Clear to auscultation bilaterally BACK:  No CVA tenderness CHEST:  Unremarkable HEART:  PMI not displaced or sustained,S1 and S2 within normal limits, no S3, no S4, no clicks, no rubs, no murmurs ABD:  Flat, positive bowel sounds normal in frequency in pitch, no bruits, no rebound, no guarding, no midline pulsatile mass, no hepatomegaly, no splenomegaly EXT:  2 plus pulses upper, absent dorsalis pedis, posterior tibialis and popliteals bilaterally, left femoral bruit,  edema, no cyanosis no clubbing  EKG:  Sinus rhythm, rate 63, first degree AV block, right bundle branch block, old inferior infarct, left axis deviation, no acute ST-T wave changes. No change from previous. 11/24/2012  Assessment and Plan    CARDIOMOYPATHY -  He seems to be euvolemic.  He has Class I symptoms.  I'm going to titrate his beta blocker slowly. He's had some low blood pressures in the past.  PVD -  He apparently gets this followed very closely at the [place]. No change in therapy is indicated.  VENTRICULAR TACHYCARDIA -  He has had no symptomatic  tachypalpitations. No further evaluation is warranted. He will continue the meds as listed.  HYPERTENSION -  This is being managed in the context of treating his CHF  HYPERLIPIDEMIA - He again says he has this followed at the [place] routinely. I will defer to their management. He is a candidate for moderate dose statin given his PVD. He's not quite on that but I would only change that it is not a reasonable LDL level such as 100 or lower.

## 2012-11-24 NOTE — Patient Instructions (Addendum)
Please increase your Metoprolol to 75 mg a day.  Take and 25 mg and a 50 mg tablet daily. Continue all other medications as listed.  Follow up in 6 months with Dr Antoine Poche.  You will receive a letter in the mail 2 months before you are due.  Please call us when you receive this letter to schedule your follow up appointment.

## 2013-05-09 ENCOUNTER — Ambulatory Visit (INDEPENDENT_AMBULATORY_CARE_PROVIDER_SITE_OTHER): Payer: Medicare Other | Admitting: Cardiology

## 2013-05-09 ENCOUNTER — Encounter: Payer: Self-pay | Admitting: Cardiology

## 2013-05-09 VITALS — BP 128/76 | HR 53 | Ht 70.5 in | Wt 161.0 lb

## 2013-05-09 DIAGNOSIS — F17200 Nicotine dependence, unspecified, uncomplicated: Secondary | ICD-10-CM | POA: Diagnosis not present

## 2013-05-09 DIAGNOSIS — I2510 Atherosclerotic heart disease of native coronary artery without angina pectoris: Secondary | ICD-10-CM | POA: Diagnosis not present

## 2013-05-09 DIAGNOSIS — I10 Essential (primary) hypertension: Secondary | ICD-10-CM | POA: Diagnosis not present

## 2013-05-09 DIAGNOSIS — I714 Abdominal aortic aneurysm, without rupture, unspecified: Secondary | ICD-10-CM | POA: Diagnosis not present

## 2013-05-09 NOTE — Patient Instructions (Addendum)
The current medical regimen is effective;  continue present plan and medications.  Follow up in 1 year with Dr Hochrein.  You will receive a letter in the mail 2 months before you are due.  Please call us when you receive this letter to schedule your follow up appointment.  

## 2013-05-09 NOTE — Progress Notes (Signed)
HPI The patient presents for followup of a nonischemic cardiomyopathy. Since I last saw him he has done well.  The patient denies any new symptoms such as chest discomfort, neck or arm discomfort. There has been no new shortness of breath, PND or orthopnea. There have been no reported palpitations, presyncope or syncope.  He says he does activity such as pushing a lawnmower. He has no limitations with this.   No Known Allergies  Current Outpatient Prescriptions  Medication Sig Dispense Refill  . aspirin 81 MG tablet Take 81 mg by mouth daily.      . cholecalciferol (VITAMIN D) 1000 UNITS tablet Take 1,000 Units by mouth 2 (two) times daily.       Marland Kitchen losartan (COZAAR) 50 MG tablet Take 50 mg by mouth daily.      . metoprolol succinate (TOPROL XL) 25 MG 24 hr tablet Take 1 tablet (25 mg total) by mouth daily.  30 tablet  6  . metoprolol succinate (TOPROL-XL) 50 MG 24 hr tablet Take 1 tablet (50 mg total) by mouth daily.  30 tablet  6  . potassium chloride SA (K-DUR,KLOR-CON) 20 MEQ tablet Take 20 mEq by mouth 2 (two) times daily.      . simvastatin (ZOCOR) 40 MG tablet Take 40 mg by mouth every evening.       No current facility-administered medications for this visit.    Past Medical History  Diagnosis Date  . TOBACCO ABUSE     Previous  . GLAUCOMA   . HYPERTENSION   . CAD     Non obstructive (mild) cath 2007  . CHF     EF 15% in 2007, 30 - 35% currently, Class I  . ABDOMINAL AORTIC ANEURYSM     Past Surgical History  Procedure Laterality Date  . Eye surgery      Glaucoma  . Corneal transplant      Right eye x 2  . Clavical repair      ROS:  As stated in the HPI and negative for all other systems.  PHYSICAL EXAM BP 128/76  Pulse 53  Ht 5' 10.5" (1.791 m)  Wt 161 lb (73.029 kg)  BMI 22.77 kg/m2  SpO2 98% GENERAL:  Well appearing HEENT:  Right lens opacification,  fundi not visualized, oral mucosa unremarkable, poor dentition NECK:  No jugular venous distention,  waveform within normal limits, carotid upstroke brisk and symmetric, no bruits, no thyromegaly LUNGS:  Clear to auscultation bilaterally BACK:  No CVA tenderness CHEST:  Unremarkable HEART:  PMI not displaced or sustained,S1 and S2 within normal limits, no S3, no S4, no clicks, no rubs, no murmurs ABD:  Flat, positive bowel sounds normal in frequency in pitch, no bruits, no rebound, no guarding, no midline pulsatile mass, no hepatomegaly, no splenomegaly EXT:  2 plus pulses upper, absent dorsalis pedis, posterior tibialis and popliteals bilaterally, left femoral bruit,  edema, no cyanosis no clubbing  EKG:  Sinus rhythm, rate 53, first degree AV block, right bundle branch block, old inferior infarct, left axis deviation, no acute ST-T wave changes. No change from previous. 05/09/2013  Assessment and Plan    CARDIOMOYPATHY -  He seems to be euvolemic.  He has Class I symptoms.  I cannot titrate his medications further. No change in therapy is indicated.  PVD -  He apparently gets this followed very closely at the [place]. No change in therapy is indicated.  He reports that he had recent studies done and that  they were "okay".  VENTRICULAR TACHYCARDIA -  He has had no symptomatic tachypalpitations. No further evaluation is warranted. He will continue the meds as listed.  HYPERTENSION -  This is being managed in the context of treating his CHF  HYPERLIPIDEMIA - He again says he has this followed at the [place] routinely.  BRADYCARDIA -  I reviewed this with him. He has no symptoms despite his conduction disturbance of bradycardia. He knows any syncopal episode prompting ER visit. We discussed the possibility of pacemaker in the future.

## 2013-06-27 ENCOUNTER — Other Ambulatory Visit: Payer: Self-pay | Admitting: Cardiology

## 2014-05-10 ENCOUNTER — Ambulatory Visit (INDEPENDENT_AMBULATORY_CARE_PROVIDER_SITE_OTHER): Payer: Medicare Other | Admitting: Cardiology

## 2014-05-10 ENCOUNTER — Encounter: Payer: Self-pay | Admitting: Cardiology

## 2014-05-10 VITALS — BP 116/72 | HR 57 | Ht 70.5 in | Wt 155.0 lb

## 2014-05-10 DIAGNOSIS — I2510 Atherosclerotic heart disease of native coronary artery without angina pectoris: Secondary | ICD-10-CM

## 2014-05-10 DIAGNOSIS — I714 Abdominal aortic aneurysm, without rupture, unspecified: Secondary | ICD-10-CM

## 2014-05-10 DIAGNOSIS — I5022 Chronic systolic (congestive) heart failure: Secondary | ICD-10-CM | POA: Diagnosis not present

## 2014-05-10 DIAGNOSIS — I10 Essential (primary) hypertension: Secondary | ICD-10-CM | POA: Diagnosis not present

## 2014-05-10 MED ORDER — METOPROLOL SUCCINATE ER 25 MG PO TB24: 25.0000 mg | ORAL_TABLET | Freq: Every day | ORAL | Status: DC

## 2014-05-10 NOTE — Patient Instructions (Addendum)
Your physician recommends that you schedule a follow-up appointment in: 6 months with Dr. Hochrein  

## 2014-05-10 NOTE — Progress Notes (Signed)
HPI The patient presents for followup of a nonischemic cardiomyopathy. Since I last saw him he has done well.  The patient denies any new symptoms such as chest discomfort, neck or arm discomfort. There has been no new shortness of breath, PND or orthopnea. There have been no reported palpitations, presyncope or syncope.  He says he does activity such as pushing a lawnmower. He has no limitations with this.  He follows at the [place].    No Known Allergies  Current Outpatient Prescriptions  Medication Sig Dispense Refill  . aspirin 81 MG tablet Take 81 mg by mouth daily.      . cholecalciferol (VITAMIN D) 1000 UNITS tablet Take 1,000 Units by mouth 2 (two) times daily.       Marland Kitchen losartan (COZAAR) 50 MG tablet Take 50 mg by mouth daily.      . metoprolol succinate (TOPROL-XL) 25 MG 24 hr tablet TAKE 1 TABLET BY MOUTH DAILY  30 tablet  10  . metoprolol succinate (TOPROL-XL) 50 MG 24 hr tablet Take 1 tablet (50 mg total) by mouth daily.  30 tablet  6  . potassium chloride SA (K-DUR,KLOR-CON) 20 MEQ tablet Take 20 mEq by mouth 2 (two) times daily.      . simvastatin (ZOCOR) 40 MG tablet Take 40 mg by mouth every evening.       No current facility-administered medications for this visit.    Past Medical History  Diagnosis Date  . TOBACCO ABUSE     Previous  . GLAUCOMA   . HYPERTENSION   . CAD     Non obstructive (mild) cath 2007  . CHF     EF 15% in 2007, 30 - 35% currently, Class I  . ABDOMINAL AORTIC ANEURYSM     Past Surgical History  Procedure Laterality Date  . Eye surgery      Glaucoma  . Corneal transplant      Right eye x 2  . Clavical repair      ROS:  As stated in the HPI and negative for all other systems.  PHYSICAL EXAM BP 116/72  Pulse 57  Ht 5' 10.5" (1.791 m)  Wt 155 lb (70.308 kg)  BMI 21.92 kg/m2 GENERAL:  Well appearing HEENT:  Right lens opacification,  fundi not visualized. NECK:  No jugular venous distention, waveform within normal limits, carotid  upstroke brisk and symmetric, no bruits, no thyromegaly LUNGS:  Clear to auscultation bilaterally CHEST:  Unremarkable HEART:  PMI not displaced or sustained,S1 and S2 within normal limits, no S3, no S4, no clicks, no rubs, no murmurs ABD:  Flat, positive bowel sounds normal in frequency in pitch, no bruits, no rebound, no guarding, no midline pulsatile mass, no hepatomegaly, no splenomegaly EXT:  2 plus pulses upper, absent dorsalis pedis, posterior tibialis and popliteals bilaterally, left femoral bruit,  edema, no cyanosis no clubbing  EKG:  Sinus rhythm, rate 57, first degree AV block, right bundle branch block, old inferior infarct, left axis deviation, no acute ST-T wave changes. No change from previous. 05/10/2014  Assessment and Plan    CARDIOMOYPATHY -  He seems to be euvolemic.  He has Class I symptoms.  I cannot titrate his medications further. No change in therapy is indicated.  PVD -  He has this followed at the [place].  I have no records but he reports a recent study.   HYPERTENSION -  This is being managed in the context of treating his CHF  HYPERLIPIDEMIA -  He again says he has this followed at the [facility] routinely.  I will defer to their management  BRADYCARDIA -  He has no symptoms.  No change in therapy.   TOBACCO - He apparently still smokes some cigarettes.  He is advised to quit completely.

## 2014-11-08 ENCOUNTER — Encounter: Payer: Self-pay | Admitting: Cardiology

## 2014-11-08 ENCOUNTER — Ambulatory Visit (INDEPENDENT_AMBULATORY_CARE_PROVIDER_SITE_OTHER): Payer: Medicare Other | Admitting: Cardiology

## 2014-11-08 VITALS — BP 122/68 | Ht 70.0 in | Wt 153.0 lb

## 2014-11-08 DIAGNOSIS — I5022 Chronic systolic (congestive) heart failure: Secondary | ICD-10-CM

## 2014-11-08 DIAGNOSIS — I10 Essential (primary) hypertension: Secondary | ICD-10-CM

## 2014-11-08 NOTE — Patient Instructions (Signed)
Your physician recommends that you schedule a follow-up appointment in: as needed with Dr. Hochrein  

## 2014-11-08 NOTE — Progress Notes (Signed)
HPI The patient presents for followup of a nonischemic cardiomyopathy. Since I last saw him he has done well.  The patient denies any new symptoms such as chest discomfort, neck or arm discomfort. He cut down two trees yesterday.  There has been no new shortness of breath, PND or orthopnea. There have been no reported palpitations, presyncope or syncope.  He might have some leg pain if he works too hard.     No Known Allergies  Current Outpatient Prescriptions  Medication Sig Dispense Refill  . aspirin 81 MG tablet Take 81 mg by mouth daily.    . cholecalciferol (VITAMIN D) 1000 UNITS tablet Take 1,000 Units by mouth 2 (two) times daily.     Marland Kitchen losartan (COZAAR) 50 MG tablet Take 50 mg by mouth daily.    . metoprolol succinate (TOPROL-XL) 25 MG 24 hr tablet Take 1 tablet (25 mg total) by mouth daily. 90 tablet 3  . metoprolol succinate (TOPROL-XL) 50 MG 24 hr tablet Take 1 tablet (50 mg total) by mouth daily. 30 tablet 6  . potassium chloride SA (K-DUR,KLOR-CON) 20 MEQ tablet Take 20 mEq by mouth 2 (two) times daily.    . simvastatin (ZOCOR) 40 MG tablet Take 40 mg by mouth every evening.     No current facility-administered medications for this visit.    Past Medical History  Diagnosis Date  . TOBACCO ABUSE     Previous  . GLAUCOMA   . HYPERTENSION   . CAD     Non obstructive (mild) cath 2007  . CHF     EF 15% in 2007, 30 - 35% currently, Class I  . ABDOMINAL AORTIC ANEURYSM     Past Surgical History  Procedure Laterality Date  . Eye surgery      Glaucoma  . Corneal transplant      Right eye x 2  . Clavical repair      ROS:  As stated in the HPI and negative for all other systems.  PHYSICAL EXAM BP 122/68 mmHg  Ht 5\' 10"  (1.778 m)  Wt 153 lb (69.4 kg)  BMI 21.95 kg/m2 GENERAL:  Well appearing HEENT:  Right lens opacification,  fundi not visualized. NECK:  No jugular venous distention, waveform within normal limits, carotid upstroke brisk and symmetric, no  bruits, no thyromegaly LUNGS:  Clear to auscultation bilaterally CHEST:  Unremarkable HEART:  PMI not displaced or sustained,S1 and S2 within normal limits, no S3, no S4, no clicks, no rubs, no murmurs ABD:  Flat, positive bowel sounds normal in frequency in pitch, no bruits, no rebound, no guarding, no midline pulsatile mass, no hepatomegaly, no splenomegaly EXT:  2 plus pulses upper, absent dorsalis pedis, posterior tibialis and popliteals bilaterally, left femoral bruit,  edema, no cyanosis no clubbing  EKG:  Sinus rhythm, rate 75, first degree AV block, right bundle branch block, old inferior infarct, left axis deviation, no acute ST-T wave changes. PVCs.  11/08/2014  Assessment and Plan    CARDIOMOYPATHY -  He seems to be euvolemic.  He has Class I symptoms.  I cannot titrate his medications further secondary to bradycardia. No change in therapy is indicated.  PVD -  He has this followed at the [place].  He has stable claudication.    HYPERTENSION -  This is being managed in the context of treating his CHF  HYPERLIPIDEMIA - He again says he has this followed at the [place] routinely.  I will defer to their management  BRADYCARDIA -  He has no symptoms.  No change in therapy.   TOBACCO - He apparently still smokes some cigars.  He is advised to quit completely.

## 2015-06-20 ENCOUNTER — Telehealth: Payer: Self-pay | Admitting: Cardiology

## 2015-06-20 MED ORDER — METOPROLOL SUCCINATE ER 25 MG PO TB24: 25.0000 mg | ORAL_TABLET | Freq: Every day | ORAL | Status: DC

## 2015-06-20 NOTE — Telephone Encounter (Signed)
New message      STAT if patient is at the pharmacy , call can be transferred to refill team.   1. Which medications need to be refilled? Metoprolol 25mg  2. Which pharmacy/location is medication to be sent to? [facility]. Do they need a 30 day or 90 day supply? 90day

## 2015-06-24 ENCOUNTER — Other Ambulatory Visit: Payer: Self-pay

## 2015-06-24 ENCOUNTER — Other Ambulatory Visit: Payer: Self-pay | Admitting: Cardiology

## 2015-06-24 MED ORDER — METOPROLOL SUCCINATE ER 25 MG PO TB24: 25.0000 mg | ORAL_TABLET | Freq: Every day | ORAL | Status: DC

## 2015-12-23 ENCOUNTER — Other Ambulatory Visit: Payer: Self-pay | Admitting: Cardiology

## 2015-12-23 NOTE — Telephone Encounter (Signed)
Rx refill sent to pharmacy. 

## 2016-04-29 ENCOUNTER — Other Ambulatory Visit: Payer: Self-pay

## 2016-04-29 ENCOUNTER — Ambulatory Visit: Payer: Self-pay | Admitting: Family Medicine

## 2016-04-29 ENCOUNTER — Other Ambulatory Visit: Payer: Self-pay | Admitting: Cardiology

## 2016-04-29 ENCOUNTER — Encounter: Payer: Self-pay | Admitting: Family Medicine

## 2016-04-29 ENCOUNTER — Ambulatory Visit (INDEPENDENT_AMBULATORY_CARE_PROVIDER_SITE_OTHER): Payer: Medicare Other | Admitting: Family Medicine

## 2016-04-29 VITALS — BP 130/74 | HR 54 | Temp 97.7°F | Wt 154.0 lb

## 2016-04-29 DIAGNOSIS — M79621 Pain in right upper arm: Secondary | ICD-10-CM | POA: Diagnosis not present

## 2016-04-29 MED ORDER — METOPROLOL SUCCINATE ER 25 MG PO TB24: ORAL_TABLET | ORAL | 0 refills | Status: DC

## 2016-04-29 NOTE — Patient Instructions (Signed)
Take 2 Aleve twice daily with food for the next 1-2 weeks. You can use heat to the area 15-20 minutes at a time 2-3 times per day.  See if this helps. You may benefit from an injection to your shoulder if not improving.
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
## 2016-04-30 ENCOUNTER — Other Ambulatory Visit: Payer: Self-pay | Admitting: *Deleted

## 2016-04-30 MED ORDER — METOPROLOL SUCCINATE ER 25 MG PO TB24: 25.0000 mg | ORAL_TABLET | Freq: Every day | ORAL | 0 refills | Status: DC

## 2016-05-05 ENCOUNTER — Encounter: Payer: Self-pay | Admitting: Cardiology

## 2016-05-06 ENCOUNTER — Telehealth: Payer: Self-pay | Admitting: *Deleted

## 2016-05-06 NOTE — Progress Notes (Signed)
HPI The patient presents for followup of a nonischemic cardiomyopathy.  Since I last saw him he has done well.  The patient denies any new symptoms such as chest discomfort, neck or arm discomfort. There has been no new shortness of breath, PND or orthopnea. There have been no reported palpitations, presyncope or syncope.   He does his yard work.  He does some walking.  With this he denies symptoms.  No Known Allergies  Current Outpatient Prescriptions  Medication Sig Dispense Refill  . aspirin 81 MG tablet Take 81 mg by mouth daily.    . cholecalciferol (VITAMIN D) 1000 UNITS tablet Take 1,000 Units by mouth 2 (two) times daily.     Marland Kitchen losartan (COZAAR) 50 MG tablet Take 50 mg by mouth daily.    . metoprolol succinate (TOPROL-XL) 25 MG 24 hr tablet Take 1 tablet (25 mg total) by mouth daily. 90 tablet 0  . potassium chloride SA (K-DUR,KLOR-CON) 20 MEQ tablet Take 20 mEq by mouth 2 (two) times daily.    . simvastatin (ZOCOR) 40 MG tablet Take 40 mg by mouth every evening.     No current facility-administered medications for this visit.     Past Medical History:  Diagnosis Date  . ABDOMINAL AORTIC ANEURYSM   . CAD    Non obstructive (mild) cath 2007  . CHF    EF 15% in 2007, 30 - 35% currently, Class I  . GLAUCOMA   . HYPERTENSION   . TOBACCO ABUSE    Previous    Past Surgical History:  Procedure Laterality Date  . Clavical Repair    . CORNEAL TRANSPLANT     Right eye x 2  . EYE SURGERY     Glaucoma    ROS:    As stated in the HPI and negative for all other systems.  PHYSICAL EXAM BP (!) 102/56 (BP Location: Left Arm, Patient Position: Sitting, Cuff Size: Normal)   Pulse 72   Ht 5' 10.5" (1.791 m)   Wt 151 lb 4 oz (68.6 kg)   BMI 21.40 kg/m  GENERAL:  Well appearing HEENT:  Right lens opacification,  fundi not visualized. NECK:  No jugular venous distention, waveform within normal limits, carotid upstroke brisk and symmetric, no bruits, no thyromegaly LUNGS:   Clear to auscultation bilaterally CHEST:  Unremarkable HEART:  PMI not displaced or sustained,S1 and S2 within normal limits, no S3, no S4, no clicks, no rubs, no murmurs ABD:  Flat, positive bowel sounds normal in frequency in pitch, no bruits, no rebound, no guarding, no midline pulsatile mass, no hepatomegaly, no splenomegaly EXT:  2 plus pulses upper, absent dorsalis pedis, posterior tibialis and popliteals bilaterally, left femoral bruit,  edema, no cyanosis no clubbing  EKG:  Sinus rhythm, rate 60, first degree AV block, right bundle branch block, old inferior infarct, left axis deviation, no acute ST-T wave changes. PVCs.  05/07/2016  Assessment and Plan    CARDIOMOYPATHY -  He seems to be euvolemic.  He has Class I symptoms.  I cannot titrate his medications further secondary to bradycardia. No change in therapy is indicated.  I will repeat an echo and also check a BMET.   PVD -  He has this followed at the [place].  He has stable claudication.    HYPERTENSION -  This is being managed in the context of treating his CHF  HYPERLIPIDEMIA - I will check a lipid profile.  With his PVD his LDL goal will  be 74s  BRADYCARDIA -  He has no symptoms.  No change in therapy.   TOBACCO - He quit all tobacco in Dec.

## 2016-05-06 NOTE — Telephone Encounter (Signed)
Left detailed message on voicemail.  

## 2016-05-06 NOTE — Telephone Encounter (Signed)
-----   Message from Robert Breeding, MD sent at 05/02/2016  7:56 PM EDT ----- His PCP should be able to fill this.  Thanks.   ----- Message ----- From: Earvin Hansen Sent: 04/30/2016   5:53 PM To: Robert Breeding, MD  Hello Dr Percival Spanish, [name] was requesting a refill on Metoprolol. You have not seen him since 10/2014 and said to follow up as needed. I gave him a refill so he would not be out but did not know if you wanted him to f/u or get from PCP  Thanks [place]

## 2016-05-07 ENCOUNTER — Ambulatory Visit (INDEPENDENT_AMBULATORY_CARE_PROVIDER_SITE_OTHER): Payer: Medicare Other | Admitting: Cardiology

## 2016-05-07 ENCOUNTER — Encounter: Payer: Self-pay | Admitting: Cardiology

## 2016-05-07 VITALS — BP 102/56 | HR 72 | Ht 70.5 in | Wt 151.2 lb

## 2016-05-07 DIAGNOSIS — E785 Hyperlipidemia, unspecified: Secondary | ICD-10-CM | POA: Diagnosis not present

## 2016-05-07 DIAGNOSIS — I429 Cardiomyopathy, unspecified: Secondary | ICD-10-CM | POA: Diagnosis not present

## 2016-05-07 NOTE — Patient Instructions (Signed)
Medication Instructions:  Continue current medications  Labwork: Fasting Lipids Liver and BMP  Testing/Procedures: Your physician has requested that you have an echocardiogram. Echocardiography is a painless test that uses sound waves to create images of your heart. It provides your doctor with information about the size and shape of your heart and how well your heart's chambers and valves are working. This procedure takes approximately one hour. There are no restrictions for this procedure.   Follow-Up: Your physician wants you to follow-up in: 18 Months. You will receive a reminder letter in the mail two months in advance. If you don't receive a letter, please call our office to schedule the follow-up appointment.   Any Other Special Instructions Will Be Listed Below (If Applicable).   If you need a refill on your cardiac medications before your next appointment, please call your pharmacy.

## 2016-05-21 ENCOUNTER — Other Ambulatory Visit: Payer: Medicare Other | Admitting: *Deleted

## 2016-05-21 ENCOUNTER — Ambulatory Visit (HOSPITAL_COMMUNITY): Payer: Medicare Other | Attending: Cardiology

## 2016-05-21 ENCOUNTER — Other Ambulatory Visit: Payer: Self-pay

## 2016-05-21 DIAGNOSIS — I119 Hypertensive heart disease without heart failure: Secondary | ICD-10-CM | POA: Insufficient documentation

## 2016-05-21 DIAGNOSIS — I429 Cardiomyopathy, unspecified: Secondary | ICD-10-CM | POA: Insufficient documentation

## 2016-05-21 DIAGNOSIS — I341 Nonrheumatic mitral (valve) prolapse: Secondary | ICD-10-CM | POA: Insufficient documentation

## 2016-05-21 DIAGNOSIS — I77810 Thoracic aortic ectasia: Secondary | ICD-10-CM | POA: Insufficient documentation

## 2016-05-21 DIAGNOSIS — I739 Peripheral vascular disease, unspecified: Secondary | ICD-10-CM | POA: Insufficient documentation

## 2016-05-21 DIAGNOSIS — I10 Essential (primary) hypertension: Secondary | ICD-10-CM | POA: Diagnosis not present

## 2016-05-21 DIAGNOSIS — Z87891 Personal history of nicotine dependence: Secondary | ICD-10-CM | POA: Diagnosis not present

## 2016-05-21 DIAGNOSIS — I340 Nonrheumatic mitral (valve) insufficiency: Secondary | ICD-10-CM | POA: Insufficient documentation

## 2016-05-21 LAB — LIPID PANEL
CHOL/HDL RATIO: 3.4 ratio (ref <=5.0)
Cholesterol: 132 mg/dL (ref 125–200)
HDL: 39 mg/dL — AB (ref >=40)
LDL Cholesterol: 72 mg/dL (ref <130)
TRIGLYCERIDES: 104 mg/dL (ref <150)
VLDL: 21 mg/dL (ref <30)

## 2016-05-21 LAB — HEPATIC FUNCTION PANEL
ALBUMIN: 3.7 g/dL (ref 3.6–5.1)
ALK PHOS: 50 U/L (ref 40–115)
ALT: 9 U/L (ref 9–46)
AST: 15 U/L (ref 10–35)
BILIRUBIN INDIRECT: 0.5 mg/dL (ref 0.2–1.2)
BILIRUBIN TOTAL: 0.6 mg/dL (ref 0.2–1.2)
Bilirubin, Direct: 0.1 mg/dL (ref <=0.2)
TOTAL PROTEIN: 7.3 g/dL (ref 6.1–8.1)

## 2016-05-21 LAB — BASIC METABOLIC PANEL
BUN: 29 mg/dL — ABNORMAL HIGH (ref 7–25)
CALCIUM: 9.2 mg/dL (ref 8.6–10.3)
CO2: 24 mmol/L (ref 20–31)
CREATININE: 2.07 mg/dL — AB (ref 0.70–1.25)
Chloride: 104 mmol/L (ref 98–110)
Glucose, Bld: 104 mg/dL — ABNORMAL HIGH (ref 65–99)
Potassium: 4 mmol/L (ref 3.5–5.3)
SODIUM: 137 mmol/L (ref 135–146)

## 2016-05-25 ENCOUNTER — Telehealth: Payer: Self-pay | Admitting: *Deleted

## 2016-05-25 DIAGNOSIS — Z79899 Other long term (current) drug therapy: Secondary | ICD-10-CM

## 2016-05-25 NOTE — Telephone Encounter (Signed)
BMP ordered for pt to get done in 2 weeks, pt is aware and lab slip mailed to pt.

## 2016-05-25 NOTE — Telephone Encounter (Signed)
-----   Message from Minus Breeding, MD sent at 05/23/2016  5:34 PM EDT ----- Creat is elevated.  Please repeat a BMET in two weeks.  Call Mr. Weitkamp with the results

## 2016-06-05 DIAGNOSIS — Z79899 Other long term (current) drug therapy: Secondary | ICD-10-CM | POA: Diagnosis not present

## 2016-06-06 LAB — BASIC METABOLIC PANEL
BUN: 22 mg/dL (ref 7–25)
CALCIUM: 9 mg/dL (ref 8.6–10.3)
CHLORIDE: 104 mmol/L (ref 98–110)
CO2: 27 mmol/L (ref 20–31)
CREATININE: 1.7 mg/dL — AB (ref 0.70–1.25)
GLUCOSE: 104 mg/dL — AB (ref 65–99)
Potassium: 3.8 mmol/L (ref 3.5–5.3)
Sodium: 137 mmol/L (ref 135–146)

## 2016-06-15 ENCOUNTER — Encounter: Payer: Self-pay | Admitting: Cardiology

## 2016-06-25 ENCOUNTER — Encounter: Payer: Self-pay | Admitting: Cardiology

## 2016-06-25 NOTE — Progress Notes (Signed)
HPI The patient presents for followup of a nonischemic cardiomyopathy. I saw him last month.   I repeated an echo and his EF was now 25%.  His creat was increased to 2.07.  However, it did come down to 1.7 on repeat.  I brought him back to discuss symptoms and he actually says he is doing very well. He walks his dog and does his yard work. With this he has no limits. The patient denies any new symptoms such as chest discomfort, neck or arm discomfort. There has been no new shortness of breath, PND or orthopnea. There have been no reported palpitations, presyncope or syncope.  No Known Allergies  Current Outpatient Prescriptions  Medication Sig Dispense Refill  . aspirin 81 MG tablet Take 81 mg by mouth daily.    . cholecalciferol (VITAMIN D) 1000 UNITS tablet Take 1,000 Units by mouth 2 (two) times daily.     Marland Kitchen losartan (COZAAR) 50 MG tablet Take 50 mg by mouth daily.    . metoprolol succinate (TOPROL-XL) 25 MG 24 hr tablet Take 1 tablet (25 mg total) by mouth daily. 90 tablet 0  . potassium chloride SA (K-DUR,KLOR-CON) 20 MEQ tablet Take 20 mEq by mouth 2 (two) times daily.    . simvastatin (ZOCOR) 40 MG tablet Take 40 mg by mouth every evening.     No current facility-administered medications for this visit.     Past Medical History:  Diagnosis Date  . ABDOMINAL AORTIC ANEURYSM   . CAD    Non obstructive (mild) cath 2007  . CHF    EF 15% in 2007, 25% currently  . GLAUCOMA   . HYPERTENSION   . TOBACCO ABUSE    Previous    Past Surgical History:  Procedure Laterality Date  . Clavical Repair    . CORNEAL TRANSPLANT     Right eye x 2  . EYE SURGERY     Glaucoma    ROS:      As stated in the HPI and negative for all other systems.  PHYSICAL EXAM BP 104/66 Comment: Right arm.  Pulse 66   Ht 5\' 9"  (1.753 m)   Wt 148 lb (67.1 kg)   BMI 21.86 kg/m  GENERAL:  Well appearing HEENT:  Right lens opacification,  fundi not visualized. NECK:  No jugular venous distention,  waveform within normal limits, carotid upstroke brisk and symmetric, no bruits, no thyromegaly LUNGS:  Clear to auscultation bilaterally CHEST:  Unremarkable HEART:  PMI not displaced or sustained,S1 and S2 within normal limits, no S3, no S4, no clicks, no rubs, no murmurs ABD:  Flat, positive bowel sounds normal in frequency in pitch, no bruits, no rebound, no guarding, no midline pulsatile mass, no hepatomegaly, no splenomegaly EXT:  2 plus pulses upper, absent dorsalis pedis, posterior tibialis and popliteals bilaterally, left femoral bruit,  edema, no cyanosis no clubbing   Lab Results  Component Value Date   CHOL 132 05/21/2016   TRIG 104 05/21/2016   HDL 39 (L) 05/21/2016   LDLCALC 72 05/21/2016    Assessment and Plan    CARDIOMOYPATHY -  He seems to be euvolemic.  He has Class I symptoms.  I cannot titrate his medications further secondary to bradycardia. No change in therapy is indicated.  His ejection fraction might be slightly reduced on this echo but it is better than it was in the distant past. Of note it did review and has a nonischemic cardiomyopathy with normal coronary arteries  in 2007 when he was diagnosed.  PVD -  He has this followed at the [place].  He has stable claudication.    HYPERTENSION -  This is being managed in the context of treating his CHF  HYPERLIPIDEMIA - I will check a lipid profile.  With his PVD his LDL goal will be 70s  BRADYCARDIA -  He has no symptoms.  No change in therapy.   TOBACCO - He quit all tobacco in Dec.

## 2016-06-26 ENCOUNTER — Encounter: Payer: Self-pay | Admitting: Cardiology

## 2016-06-26 ENCOUNTER — Ambulatory Visit (INDEPENDENT_AMBULATORY_CARE_PROVIDER_SITE_OTHER): Payer: Medicare Other | Admitting: Cardiology

## 2016-06-26 VITALS — BP 104/66 | HR 66 | Ht 69.0 in | Wt 148.0 lb

## 2016-06-26 DIAGNOSIS — I420 Dilated cardiomyopathy: Secondary | ICD-10-CM | POA: Diagnosis not present

## 2016-06-26 NOTE — Patient Instructions (Signed)

## 2016-12-02 ENCOUNTER — Telehealth: Payer: Self-pay | Admitting: Family Medicine

## 2016-12-02 NOTE — Telephone Encounter (Signed)
Called pt and left message to call our office to schedule a CPE appt

## 2017-03-15 ENCOUNTER — Other Ambulatory Visit: Payer: Self-pay

## 2017-06-28 ENCOUNTER — Other Ambulatory Visit: Payer: Self-pay | Admitting: Cardiology

## 2017-08-10 DIAGNOSIS — H179 Unspecified corneal scar and opacity: Secondary | ICD-10-CM | POA: Diagnosis not present

## 2017-09-26 ENCOUNTER — Other Ambulatory Visit: Payer: Self-pay | Admitting: Cardiology

## 2017-09-27 NOTE — Telephone Encounter (Signed)
Rx(s) sent to pharmacy electronically.  

## 2017-10-28 NOTE — Progress Notes (Signed)
HPI The patient presents for followup of a nonischemic cardiomyopathy. I saw him last in 2017.   EF was 25%.  His creat was increased to 2.07.  However, it did come down to 1.7.  I do see more recent labs and his creatinine was 1.2.  He has had a non ischemic cardiomyopathy with normal coronaries in the past.  He reports he is feeling well.  Since I have seen him he has been active.  He does yard work.  He rakes and he mows.  He walks the dog.  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  With this he denies any cardiovascular symptoms.  He denies any chest pressure, neck or arm discomfort.  He said no palpitations, presyncope or syncope.  He said no PND or orthopnea.  No Known Allergies  Current Outpatient Medications  Medication Sig Dispense Refill  . aspirin 81 MG tablet Take 81 mg by mouth daily.    . cholecalciferol (VITAMIN D) 1000 UNITS tablet Take 1,000 Units by mouth 2 (two) times daily.     . metoprolol succinate (TOPROL-XL) 25 MG 24 hr tablet Take 1 tablet (25 mg total) by mouth daily. 90 tablet 0  . metoprolol succinate (TOPROL-XL) 25 MG 24 hr tablet Take 1 tablet (25 mg total) by mouth daily. 90 tablet 0  . potassium chloride SA (K-DUR,KLOR-CON) 20 MEQ tablet Take 20 mEq by mouth 2 (two) times daily.    . simvastatin (ZOCOR) 40 MG tablet Take 40 mg by mouth every evening.    . sacubitril-valsartan (ENTRESTO) 49-51 MG Take 1 tablet by mouth 2 (two) times daily. 60 tablet 11   No current facility-administered medications for this visit.     Past Medical History:  Diagnosis Date  . ABDOMINAL AORTIC ANEURYSM   . CAD    Non obstructive (mild) cath 2007  . CHF    EF 15% in 2007, 25% currently  . GLAUCOMA   . HYPERTENSION   . TOBACCO ABUSE    Previous    Past Surgical History:  Procedure Laterality Date  . Clavical Repair    . CORNEAL TRANSPLANT     Right eye x 2  . EYE SURGERY     Glaucoma    ROS:      As stated in the HPI and negative for all other  systems.  PHYSICAL EXAM BP 122/64   Pulse (!) 59   Ht '5\' 10"'  (1.778 m)   Wt 146 lb 12.8 oz (66.6 kg)   BMI 21.06 kg/m   GENERAL:  Well appearing HEENT: Right lens opacification, fundi not visualized NECK:  No jugular venous distention, waveform within normal limits, carotid upstroke brisk and symmetric, no bruits, no thyromegaly LUNGS:  Clear to auscultation bilaterally CHEST:  Unremarkable HEART:  PMI not displaced or sustained,S1 and S2 within normal limits, no S3, no S4, no clicks, no rubs, no murmurs ABD:  Flat, positive bowel sounds normal in frequency in pitch, no bruits, no rebound, no guarding, no midline pulsatile mass, no hepatomegaly, no splenomegaly EXT:  2 plus pulses upper, absent dorsalis pedis and posterior tibialis bilaterally, no edema, no cyanosis no clubbing   EKG: Sinus rhythm, rate 59, first-degree AV block, right bundle branch block, left anterior fascicular block, premature atrial contractions.  Lab Results  Component Value Date   CHOL 132 05/21/2016   TRIG 104 05/21/2016   HDL 39 (L) 05/21/2016   LDLCALC 72 05/21/2016    Assessment and Plan  CARDIOMOYPATHY -  He seems to be euvolemic.  He has class I symptoms.  Is nonischemic.  There is not an indication for defibrillator at this point.  Would not be surprising if he needs a pacemaker in the future at which point he get a biV device.   I am going to change him to [facility]  I like to have him come back for a be met in 10 days and med titration in about a month.   PVD -  He has stable claudication and is followed by the [place].  HYPERTENSION -  This is being managed in the context of treating his CHF  HYPERLIPIDEMIA - LDL was 43 with an HDL of 50.  The remainder the meds as listed.   BRADYCARDIA -  He has had no symptoms but he does have trifascicular block.  He understands that if he ever has syncope he would need to let me know immediately or present to the emergency room.   TOBACCO - He smokes  rarely and he understands I want him to quit completely.

## 2017-10-29 ENCOUNTER — Ambulatory Visit (INDEPENDENT_AMBULATORY_CARE_PROVIDER_SITE_OTHER): Payer: Medicare Other | Admitting: Cardiology

## 2017-10-29 ENCOUNTER — Encounter: Payer: Self-pay | Admitting: Cardiology

## 2017-10-29 VITALS — BP 122/64 | HR 59 | Ht 70.0 in | Wt 146.8 lb

## 2017-10-29 DIAGNOSIS — Z79899 Other long term (current) drug therapy: Secondary | ICD-10-CM | POA: Diagnosis not present

## 2017-10-29 DIAGNOSIS — I739 Peripheral vascular disease, unspecified: Secondary | ICD-10-CM

## 2017-10-29 DIAGNOSIS — I10 Essential (primary) hypertension: Secondary | ICD-10-CM

## 2017-10-29 DIAGNOSIS — R001 Bradycardia, unspecified: Secondary | ICD-10-CM

## 2017-10-29 DIAGNOSIS — I429 Cardiomyopathy, unspecified: Secondary | ICD-10-CM

## 2017-10-29 MED ORDER — SACUBITRIL-VALSARTAN 49-51 MG PO TABS: 1.0000 | ORAL_TABLET | Freq: Two times a day (BID) | ORAL | 11 refills | Status: DC

## 2017-10-29 NOTE — Patient Instructions (Addendum)
Medication Instructions:  STOP- Losartan START- Entresto 49/51 mg 1 tablet twice a day   If you need a refill on your cardiac medications before your next appointment, please call your pharmacy.  Labwork: BMP in 10 days HERE IN OUR OFFICE AT [facility]  Take the provided lab slips for you to take with you to the lab for you blood draw.   You will NOT need to fast   Testing/Procedures: Your physician has requested that you have an echocardiogram September 2020. Echocardiography is a painless test that uses sound waves to create images of your heart. It provides your doctor with information about the size and shape of your heart and how well your heart's chambers and valves are working. This procedure takes approximately one hour. There are no restrictions for this procedure.  Follow-Up: Your physician wants you to follow-up in: 1 Month with APP.     Thank you for choosing [facility]!!

## 2017-11-08 ENCOUNTER — Ambulatory Visit (HOSPITAL_COMMUNITY): Payer: Medicare Other | Attending: Cardiovascular Disease

## 2017-11-08 ENCOUNTER — Other Ambulatory Visit: Payer: Self-pay

## 2017-11-08 DIAGNOSIS — I714 Abdominal aortic aneurysm, without rupture: Secondary | ICD-10-CM | POA: Insufficient documentation

## 2017-11-08 DIAGNOSIS — I110 Hypertensive heart disease with heart failure: Secondary | ICD-10-CM | POA: Diagnosis not present

## 2017-11-08 DIAGNOSIS — I509 Heart failure, unspecified: Secondary | ICD-10-CM | POA: Diagnosis not present

## 2017-11-08 DIAGNOSIS — I429 Cardiomyopathy, unspecified: Secondary | ICD-10-CM | POA: Insufficient documentation

## 2017-11-08 DIAGNOSIS — Z87891 Personal history of nicotine dependence: Secondary | ICD-10-CM | POA: Diagnosis not present

## 2017-11-08 DIAGNOSIS — Z79899 Other long term (current) drug therapy: Secondary | ICD-10-CM | POA: Diagnosis not present

## 2017-11-08 LAB — BASIC METABOLIC PANEL
BUN / CREAT RATIO: 12 (ref 10–24)
BUN: 20 mg/dL (ref 8–27)
CO2: 24 mmol/L (ref 20–29)
CREATININE: 1.67 mg/dL — AB (ref 0.76–1.27)
Calcium: 9.5 mg/dL (ref 8.6–10.2)
Chloride: 104 mmol/L (ref 96–106)
GFR calc Af Amer: 47 mL/min/{1.73_m2} — ABNORMAL LOW (ref >59)
GFR calc non Af Amer: 41 mL/min/{1.73_m2} — ABNORMAL LOW (ref >59)
GLUCOSE: 99 mg/dL (ref 65–99)
Potassium: 4.2 mmol/L (ref 3.5–5.2)
Sodium: 141 mmol/L (ref 134–144)

## 2017-12-01 ENCOUNTER — Encounter: Payer: Self-pay | Admitting: Adult Health

## 2017-12-01 ENCOUNTER — Ambulatory Visit (INDEPENDENT_AMBULATORY_CARE_PROVIDER_SITE_OTHER): Payer: Medicare Other | Admitting: Adult Health

## 2017-12-01 VITALS — BP 124/72 | HR 57 | Ht 70.0 in | Wt 144.0 lb

## 2017-12-01 DIAGNOSIS — I429 Cardiomyopathy, unspecified: Secondary | ICD-10-CM

## 2017-12-01 DIAGNOSIS — I519 Heart disease, unspecified: Secondary | ICD-10-CM | POA: Diagnosis not present

## 2017-12-01 DIAGNOSIS — I10 Essential (primary) hypertension: Secondary | ICD-10-CM

## 2017-12-01 DIAGNOSIS — Z79899 Other long term (current) drug therapy: Secondary | ICD-10-CM

## 2017-12-01 MED ORDER — SACUBITRIL-VALSARTAN 97-103 MG PO TABS: 1.0000 | ORAL_TABLET | Freq: Two times a day (BID) | ORAL | 2 refills | Status: DC

## 2017-12-01 NOTE — Patient Instructions (Signed)
Medication Instructions:  INCREASE ENTRESTO 97-103MG  DAILY(TAKE 2 TABLETS OF THE SAMPLES GIVEN)  If you need a refill on your cardiac medications before your next appointment, please call your pharmacy.  Labwork: BMET 1 WEEK BEFORE UPCOMING APPT HERE IN OUR OFFICE AT [facility]  Take the provided lab slips for you to take with you to the lab for you blood draw.   You will NOT need to fast   Testing/Procedures: Echocardiogram - Your physician has requested that you have an echocardiogram. Echocardiography is a painless test that uses sound waves to create images of your heart. It provides your doctor with information about the size and shape of your heart and how well your heart's chambers and valves are working. This procedure takes approximately one hour. There are no restrictions for this procedure. This will be performed at our [facility] location - [address].  Follow-Up: Your physician wants you to follow-up in: [place].   Thank you for choosing [facility]!!

## 2017-12-01 NOTE — Progress Notes (Signed)
Cardiology Office Note   Date:  12/01/2017   ID:  Isao Seltzer, DOB Jan 13, 1946, MRN [id number]  PCP:  Patient, No Pcp Per  Cardiologist:  Dr. Percival Spanish  Chief Complaint  Patient presents with  . Follow-up  . Congestive Heart Failure  . Cardiomyopathy     History of Present Illness: Shaheed Schmuck is a 72 y.o. male who presents for ongoing assessment and management of nonischemic cardiomyopathy.  Most recent EF was documented at 25%.  He was last seen by Dr. Percival Spanish on 10/29/2017.  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    At that time the patient was found to be euvolemic, with New York Heart Association class I symptoms.  He was placed on Entresto on last office visit and was to come back in 1 week with a BMET and evaluation of his response to treatment, along with possible titration. Other history includes bradycardia, hyperlipidemia, is stable HFrEF  He denies any complaints with medication to include dizziness shortness of breath or fatigue.  He continues to work in his yard, walk, and remain active. He denies palpitations, heart racing.  Echocardiogram 11/08/2017 Study Conclusions  - Left ventricle: The cavity size was normal. Systolic function was   severely reduced. The estimated ejection fraction was in the   range of 25% to 30%. - Right ventricle: Systolic function was mildly reduced.  Past Medical History:  Diagnosis Date  . ABDOMINAL AORTIC ANEURYSM   . CAD    Non obstructive (mild) cath 2007  . CHF    EF 15% in 2007, 25% currently  . GLAUCOMA   . HYPERTENSION   . TOBACCO ABUSE    Previous    Past Surgical History:  Procedure Laterality Date  . Clavical Repair    . CORNEAL TRANSPLANT     Right eye x 2  . EYE SURGERY     Glaucoma     Current Outpatient Medications  Medication Sig Dispense Refill  . aspirin 81 MG tablet Take 81 mg by mouth daily.    . cholecalciferol (VITAMIN D)  1000 UNITS tablet Take 1,000 Units by mouth 2 (two) times daily.     . metoprolol succinate (TOPROL-XL) 25 MG 24 hr tablet Take 1 tablet (25 mg total) by mouth daily. 90 tablet 0  . potassium chloride SA (K-DUR,KLOR-CON) 20 MEQ tablet Take 20 mEq by mouth 2 (two) times daily.    . simvastatin (ZOCOR) 40 MG tablet Take 40 mg by mouth every evening.    . sacubitril-valsartan (ENTRESTO) 97-103 MG Take 1 tablet by mouth 2 (two) times daily. 60 tablet 2   No current facility-administered medications for this visit.     Allergies:   Patient has no known allergies.    Social History:  The patient  reports that he quit smoking about 9 years ago. He has never used smokeless tobacco. He reports that he does not drink alcohol or use drugs.   Family History:  The patient's family history is not on file.    ROS: All other systems are reviewed and negative. Unless otherwise mentioned in H&P    PHYSICAL EXAM: VS:  BP 124/72   Pulse (!) 57   Ht 5\' 10"  (1.778 m)   Wt 144 lb (65.3 kg)   BMI 20.66 kg/m  , BMI Body mass index is 20.66 kg/m. GEN: Well nourished, well developed, in no acute distress  HEENT: normal  Neck: no JVD, carotid bruits, or masses Cardiac: RRR; no murmurs, rubs, or gallops,no edema  Respiratory:  Clear to auscultation bilaterally, normal work of breathing GI: soft, nontender, nondistended, + BS MS: no deformity or atrophy  Skin: warm and dry, no rash Neuro:  Strength and sensation are intact Psych: euthymic mood, full affect   EKG:  Not competed this office visit.   Recent Labs: 11/08/2017: BUN 20; Creatinine, Ser 1.67; Potassium 4.2; Sodium 141    Lipid Panel    Component Value Date/Time   CHOL 132 05/21/2016 1203   TRIG 104 05/21/2016 1203   HDL 39 (L) 05/21/2016 1203   CHOLHDL 3.4 05/21/2016 1203   VLDL 21 05/21/2016 1203   LDLCALC 72 05/21/2016 1203      Wt Readings from Last 3 Encounters:  12/01/17 144 lb (65.3 kg)  10/29/17 146 lb 12.8 oz (66.6 kg)    06/26/16 148 lb (67.1 kg)      ASSESSMENT AND PLAN:  1.  HFrEF: Most recent echo as above.  Continues to have reduced ejection fraction of 25%.  I will titrate up Entresto to 96/102 mg.  We do not have the higher dose samples so I am providing him with more of the 49/51 mg tablet which she is to take 2 in the morning and 2 in the evening.  He is to call us to let us know how he is feeling on higher dose as he is currently asymptomatic on moderate dose.  He will follow-up in 3 months with a repeat echocardiogram and BMET.  2.  Ongoing tobacco abuse: The patient is stop smoking cigarettes and now is smoking Black and Mild cigarillos, 2 a day.  He states he will stop buying them as this is not become a habit it is something he enjoys doing.  3.  History of AAA: I reviewed past medical records, and past notes, there is no documentation of AAA size or follow-up testing.  He is followed by the [facility] for this.  Records have been requested in the past.  Current medicines are reviewed at length with the patient today.    Labs/ tests ordered today include: BMET and Repeat echo in 3 months  Phill Myron. West Pugh, ANP, AACC   12/01/2017 11:13 AM    [place]. [address] [place], [place] [address] Phone: [phone]; Fax: [phone]

## 2017-12-28 ENCOUNTER — Telehealth: Payer: Self-pay | Admitting: Cardiology

## 2017-12-28 NOTE — Telephone Encounter (Signed)
New Message:   Pt says he needs a letter for the [place] doctor explaining why pt was changed from Lisinopril to [facility].Please fax this letter to [phone].Att: Dr Meyer Russel

## 2017-12-28 NOTE — Telephone Encounter (Signed)
LEFT MESSAGE ON  PATIENT'S ANSWER MACHINE   LAST 2 OFFICE NOTES FAXED TO THE DR RAJI'S FAXED NUMBER LEFT BY PATIENT.

## 2018-01-25 ENCOUNTER — Telehealth: Payer: Self-pay | Admitting: Cardiology

## 2018-01-25 NOTE — Telephone Encounter (Signed)
Spoke to patient, aware will fax OV note to [place].  Patient aware and verbalized understanding.

## 2018-01-25 NOTE — Telephone Encounter (Signed)
New message    [facility] to [facility] Percival Spanish changed from lisinopril to [facility] ?   F[facility] # [phone] [facility] Delene [facility]uffini

## 2018-02-27 ENCOUNTER — Other Ambulatory Visit: Payer: Self-pay | Admitting: Cardiology

## 2018-03-02 ENCOUNTER — Other Ambulatory Visit: Payer: Medicare Other

## 2018-03-02 ENCOUNTER — Telehealth: Payer: Self-pay | Admitting: Cardiology

## 2018-03-02 ENCOUNTER — Ambulatory Visit (HOSPITAL_COMMUNITY): Payer: Medicare Other | Attending: Cardiology

## 2018-03-02 ENCOUNTER — Other Ambulatory Visit: Payer: Self-pay

## 2018-03-02 DIAGNOSIS — I519 Heart disease, unspecified: Secondary | ICD-10-CM | POA: Insufficient documentation

## 2018-03-02 DIAGNOSIS — I429 Cardiomyopathy, unspecified: Secondary | ICD-10-CM | POA: Diagnosis not present

## 2018-03-02 DIAGNOSIS — Z79899 Other long term (current) drug therapy: Secondary | ICD-10-CM | POA: Diagnosis not present

## 2018-03-02 LAB — BASIC METABOLIC PANEL
BUN / CREAT RATIO: 10 (ref 10–24)
BUN: 14 mg/dL (ref 8–27)
CO2: 25 mmol/L (ref 20–29)
Calcium: 9.3 mg/dL (ref 8.6–10.2)
Chloride: 100 mmol/L (ref 96–106)
Creatinine, Ser: 1.47 mg/dL — ABNORMAL HIGH (ref 0.76–1.27)
GFR calc Af Amer: 55 mL/min/{1.73_m2} — ABNORMAL LOW (ref >59)
GFR calc non Af Amer: 47 mL/min/{1.73_m2} — ABNORMAL LOW (ref >59)
GLUCOSE: 80 mg/dL (ref 65–99)
POTASSIUM: 4.2 mmol/L (ref 3.5–5.2)
SODIUM: 138 mmol/L (ref 134–144)

## 2018-03-02 NOTE — Telephone Encounter (Signed)
New Message:        *STAT* If patient is at the pharmacy, call can be transferred to refill team.   1. Which medications need to be refilled? (please list name of each medication and dose if known) metoprolol succinate (TOPROL-XL) 25 MG 24 hr tablet  2. Which pharmacy/location (including street and city if local pharmacy) is medication to be sent to?[facility] [place] [place] [facility] [address] [place]  3. Do they need a 30 day or 90 day supply? [place]

## 2018-03-02 NOTE — Telephone Encounter (Signed)
Message left with the patient. Prescription was sent in yesterday.

## 2018-03-08 NOTE — Progress Notes (Signed)
HPI The patient presents for followup of a nonischemic cardiomyopathy. I saw him last in 2017.   EF was 25%.  His creat was increased to 2.07.  However, it did come down with the most recent being 1.47.   His Entresto was increased at the last visit.   EF continued to be low at 25% on echo last week.   He feels very well.  He is extremely active.  He walks daily.  He walks the dog.  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  He denies any cardiovascular symptoms.  In the past this is been a nonischemic cardiomyopathy with normal coronaries.   No Known Allergies  Current Outpatient Medications  Medication Sig Dispense Refill  . aspirin 81 MG tablet Take 81 mg by mouth daily.    . cholecalciferol (VITAMIN D) 1000 UNITS tablet Take 1,000 Units by mouth 2 (two) times daily.     . metoprolol succinate (TOPROL-XL) 25 MG 24 hr tablet TAKE 1 TABLET(25 MG) BY MOUTH DAILY 90 tablet 2  . potassium chloride SA (K-DUR,KLOR-CON) 20 MEQ tablet Take 20 mEq by mouth 2 (two) times daily.    . sacubitril-valsartan (ENTRESTO) 97-103 MG Take 1 tablet by mouth 2 (two) times daily. 60 tablet 2  . simvastatin (ZOCOR) 40 MG tablet Take 40 mg by mouth every evening.     No current facility-administered medications for this visit.     Past Medical History:  Diagnosis Date  . ABDOMINAL AORTIC ANEURYSM   . CAD    Non obstructive (mild) cath 2007  . CHF    EF 15% in 2007, 25% currently  . GLAUCOMA   . HYPERTENSION   . TOBACCO ABUSE    Previous    Past Surgical History:  Procedure Laterality Date  . Clavical Repair    . CORNEAL TRANSPLANT     Right eye x 2  . EYE SURGERY     Glaucoma    ROS:      As stated in the HPI and negative for all other systems.  PHYSICAL EXAM BP (!) 102/58 (BP Location: Left Arm, Patient Position: Sitting, Cuff Size: Normal)   Pulse (!) 59   Ht 5\' 11"  (1.803 m)   Wt 140 lb (63.5 kg)   BMI 19.53 kg/m   GENERAL:  Well appearing NECK:  No jugular venous  distention, waveform within normal limits, carotid upstroke brisk and symmetric, no bruits, no thyromegaly LUNGS:  Clear to auscultation bilaterally CHEST:  Unremarkable HEART:  PMI not displaced or sustained,S1 and S2 within normal limits, no S3, no S4, no clicks, no rubs, no murmurs ABD:  Flat, positive bowel sounds normal in frequency in pitch, no bruits, no rebound, no guarding, no midline pulsatile mass, no hepatomegaly, no splenomegaly EXT:  2 plus pulses throughout, no edema, no cyanosis no clubbing    EKG: NA  Lab Results  Component Value Date   CHOL 132 05/21/2016   TRIG 104 05/21/2016   HDL 39 (L) 05/21/2016   LDLCALC 72 05/21/2016    Assessment and Plan    CARDIOMOYPATHY -  The EF continues to be reduced.  However, on very careful questioning he has absolutely no symptoms.  He is extremely active.  He would not tolerate med titration.  At this point given the absence of symptoms there is no indication for device therapy.  I did discuss with the patient however that given his conduction disturbances should he ever have  any presyncope or syncope or palpitations I would want to know about this immediately.  PVD -  This is followed at the [place].  No change in therapy.  HYPERTENSION -  This is being managed in the context of treating his CHF  HYPERLIPIDEMIA - His LDL is excellent as above.  No change in therapy.   BRADYCARDIA -  As above.  He has trifasicular block.  TOBACCO -  He knows he needs to quit tocacco.

## 2018-03-10 ENCOUNTER — Ambulatory Visit (INDEPENDENT_AMBULATORY_CARE_PROVIDER_SITE_OTHER): Payer: Medicare Other | Admitting: Cardiology

## 2018-03-10 ENCOUNTER — Encounter: Payer: Self-pay | Admitting: Cardiology

## 2018-03-10 VITALS — BP 102/58 | HR 59 | Ht 71.0 in | Wt 140.0 lb

## 2018-03-10 DIAGNOSIS — I459 Conduction disorder, unspecified: Secondary | ICD-10-CM | POA: Diagnosis not present

## 2018-03-10 DIAGNOSIS — I10 Essential (primary) hypertension: Secondary | ICD-10-CM | POA: Diagnosis not present

## 2018-03-10 DIAGNOSIS — I420 Dilated cardiomyopathy: Secondary | ICD-10-CM

## 2018-03-10 NOTE — Patient Instructions (Signed)

## 2018-05-19 ENCOUNTER — Telehealth: Payer: Self-pay | Admitting: Cardiology

## 2018-05-19 MED ORDER — SACUBITRIL-VALSARTAN 97-103 MG PO TABS: 1.0000 | ORAL_TABLET | Freq: Two times a day (BID) | ORAL | 3 refills | Status: DC

## 2018-05-19 NOTE — Telephone Encounter (Signed)
New Message          *STAT* If patient is at the pharmacy, call can be transferred to refill team.   1. Which medications need to be refilled? (please list name of each medication and dose if known) sacubitril-valsartan (ENTRESTO) 97-103 MG  2. Which pharmacy/location (including street and city if local pharmacy) is medication to be sent to? [facility] [address]  3. Do they need a 30 day or 90 day supply? [place]

## 2018-05-19 NOTE — Telephone Encounter (Signed)
Rx sent to pt pharmacy for Entresto 97/103 bid #[id number] R-3

## 2018-07-13 ENCOUNTER — Other Ambulatory Visit: Payer: Self-pay

## 2018-09-03 NOTE — Progress Notes (Signed)
HPI The patient presents for followup of a nonischemic cardiomyopathy EF was 25%.  He works in Water engineer.  He does a lot of walking every night.  He does a lot of pushing and lifting and cleaning.  He says he has absolutely no symptoms with this.  He denies any shortness of breath, PND or orthopnea.  He has no palpitations, presyncope or syncope.  He has no weight gain or edema   No Known Allergies  Current Outpatient Medications  Medication Sig Dispense Refill  . aspirin 81 MG tablet Take 81 mg by mouth daily.    . cholecalciferol (VITAMIN D) 1000 UNITS tablet Take 1,000 Units by mouth 2 (two) times daily.     . metoprolol succinate (TOPROL-XL) 25 MG 24 hr tablet TAKE 1 TABLET(25 MG) BY MOUTH DAILY 90 tablet 2  . potassium chloride SA (K-DUR,KLOR-CON) 20 MEQ tablet Take 20 mEq by mouth 2 (two) times daily.    . sacubitril-valsartan (ENTRESTO) 97-103 MG Take 1 tablet by mouth 2 (two) times daily. 180 tablet 3  . simvastatin (ZOCOR) 40 MG tablet Take 40 mg by mouth every evening.     No current facility-administered medications for this visit.     Past Medical History:  Diagnosis Date  . ABDOMINAL AORTIC ANEURYSM   . CAD    Non obstructive (mild) cath 2007  . CHF    EF 15% in 2007, 25% currently  . GLAUCOMA   . HYPERTENSION   . TOBACCO ABUSE    Previous    Past Surgical History:  Procedure Laterality Date  . Clavical Repair    . CORNEAL TRANSPLANT     Right eye x 2  . EYE SURGERY     Glaucoma    ROS:      As stated in the HPI and negative for all other systems.  PHYSICAL EXAM BP 116/64   Pulse 61   Ht 5' 10.5" (1.791 m)   Wt 146 lb 6.4 oz (66.4 kg)   BMI 20.71 kg/m   GENERAL:  Well appearing NECK:  No jugular venous distention, waveform within normal limits, carotid upstroke brisk and symmetric, no bruits, no thyromegaly LUNGS:  Clear to auscultation bilaterally CHEST:  Unremarkable HEART:  PMI not displaced or sustained,S1 and S2 within normal  limits, no S3, no S4, no clicks, no rubs, NO murmurs ABD:  Flat, positive bowel sounds normal in frequency in pitch, no bruits, no rebound, no guarding, no midline pulsatile mass, no hepatomegaly, no splenomegaly EXT:  2 plus pulses upper pulses, diminished dorsalis pedis and posterior tibialis bilateral, no edema, no cyanosis no clubbing   EKG: Sinus rhythm, rate 61, left axis deviation, first-degree AV block, right bundle branch block, old inferior infarct, no acute ST-T wave changes.   Lab Results  Component Value Date   CHOL 132 05/21/2016   TRIG 104 05/21/2016   HDL 39 (L) 05/21/2016   LDLCALC 72 05/21/2016    Assessment and Plan    CARDIOMOYPATHY -  His EF remains low but he has had Class 1 symptoms.  No change in therapy.  His blood pressure will not allow med titration.  PVD -  This is followed at the [place]. he has no claudication.  He walks routinely and this is actually helped his symptoms over time.  HYPERTENSION -  This is being managed in the context of treating his CHF  HYPERLIPIDEMIA - I do not have his lipids from the [place].  He  says he can get me a copy.  The goal is less than 70 LDL.   BRADYCARDIA -  He has no symptoms related to this.  No change in therapy.  TOBACCO - Again he was told to quit smoking completely.  He says he smokes a few cigars.  AAA - He says he has this followed at the [place].  Have asked him to inquire into the size of this and when he gets his next imaging.

## 2018-09-05 ENCOUNTER — Ambulatory Visit (INDEPENDENT_AMBULATORY_CARE_PROVIDER_SITE_OTHER): Payer: Medicare Other | Admitting: Cardiology

## 2018-09-05 ENCOUNTER — Encounter: Payer: Self-pay | Admitting: Cardiology

## 2018-09-05 VITALS — BP 116/64 | HR 61 | Ht 70.5 in | Wt 146.4 lb

## 2018-09-05 DIAGNOSIS — I10 Essential (primary) hypertension: Secondary | ICD-10-CM | POA: Diagnosis not present

## 2018-09-05 DIAGNOSIS — Z720 Tobacco use: Secondary | ICD-10-CM

## 2018-09-05 DIAGNOSIS — I420 Dilated cardiomyopathy: Secondary | ICD-10-CM | POA: Diagnosis not present

## 2018-09-05 DIAGNOSIS — R001 Bradycardia, unspecified: Secondary | ICD-10-CM

## 2018-09-05 DIAGNOSIS — I714 Abdominal aortic aneurysm, without rupture, unspecified: Secondary | ICD-10-CM

## 2018-09-05 DIAGNOSIS — E785 Hyperlipidemia, unspecified: Secondary | ICD-10-CM

## 2018-09-05 NOTE — Patient Instructions (Signed)
Medication Instructions:  Continue current medications  If you need a refill on your cardiac medications before your next appointment, please call your pharmacy.  Labwork: None Ordered   Take the provided lab slips with you to the lab for your blood draw.  When you have your labs (blood work) drawn today and your tests are completely normal, you will receive your results only by MyChart Message (if you have MyChart) -OR-  A paper copy in the mail.  If you have any lab test that is abnormal or we need to change your treatment, we will call you to review these results.  Testing/Procedures: None Ordered  Special Instructions: How big is my AAA? Is my LDL less than 70?  Follow-Up: You will need a follow up appointment in 12 months.  Please call our office 2 months in advance to schedule this appointment.  You may see Dr Percival Spanish or one of the following Advanced Practice Providers on your designated Care Team:   Rosaria Ferries, PA-C . Jory Sims, DNP, ANP   At [facility], you and your health needs are our priority.  As part of our continuing mission to provide you with exceptional heart care, we have created designated Provider Care Teams.  These Care Teams include your primary Cardiologist (physician) and Advanced Practice Providers (APPs -  Physician Assistants and Nurse Practitioners) who all work together to provide you with the care you need, when you need it.  Thank you for choosing [facility] at [facility]!!

## 2018-12-03 ENCOUNTER — Other Ambulatory Visit: Payer: Self-pay | Admitting: Cardiology

## 2018-12-05 NOTE — Telephone Encounter (Signed)
Rx(s) sent to pharmacy electronically.  

## 2019-08-29 DIAGNOSIS — Z7189 Other specified counseling: Secondary | ICD-10-CM | POA: Insufficient documentation

## 2019-08-29 DIAGNOSIS — I739 Peripheral vascular disease, unspecified: Secondary | ICD-10-CM | POA: Insufficient documentation

## 2019-08-29 NOTE — Progress Notes (Signed)
Cardiology Office Note   Date:  08/31/2019   ID:  [name], DOB 1946-03-18, MRN [id number]  PCP:  Linus Mako, NP  Cardiologist:   No primary care provider on file.   Chief Complaint  Patient presents with  . Cardiomyopathy      History of Present Illness: [name] is a 74 y.o. male who presents for followup of a nonischemic cardiomyopathy EF was 25%.    Since I last saw him he has done well.  He still works in Nurse, adult job part-time. The patient denies any new symptoms such as chest discomfort, neck or arm discomfort. There has been no new shortness of breath, PND or orthopnea. There have been no reported palpitations, presyncope or syncope.     Past Medical History:  Diagnosis Date  . ABDOMINAL AORTIC ANEURYSM   . CAD    Non obstructive (mild) cath 2007  . CHF    EF 15% in 2007, 25% currently  . GLAUCOMA   . HYPERTENSION   . TOBACCO ABUSE    Previous    Past Surgical History:  Procedure Laterality Date  . Clavical Repair    . CORNEAL TRANSPLANT     Right eye x 2  . EYE SURGERY     Glaucoma     Current Outpatient Medications  Medication Sig Dispense Refill  . aspirin 81 MG tablet Take 81 mg by mouth daily.    . cholecalciferol (VITAMIN D) 1000 UNITS tablet Take 1,000 Units by mouth 2 (two) times daily.     . metoprolol succinate (TOPROL-XL) 25 MG 24 hr tablet Take 1 tablet (25 mg total) by mouth daily. 90 tablet 2  . potassium chloride SA (K-DUR,KLOR-CON) 20 MEQ tablet Take 20 mEq by mouth 2 (two) times daily.    . sacubitril-valsartan (ENTRESTO) 97-103 MG Take 1 tablet by mouth 2 (two) times daily. 180 tablet 3  . simvastatin (ZOCOR) 40 MG tablet Take 40 mg by mouth every evening.     No current facility-administered medications for this visit.    Allergies:   Patient has no known allergies.    ROS:  Please see the history of present illness.   Otherwise, review of systems are positive for none.   All other systems are reviewed and  negative.    PHYSICAL EXAM: VS:  BP 126/60   Pulse 64   Temp 97.7 F (36.5 C)   Ht 5' 10.5" (1.791 m)   Wt 151 lb 9.6 oz (68.8 kg)   BMI 21.44 kg/m  , BMI Body mass index is 21.44 kg/m. GENERAL:  Well appearing NECK:  No jugular venous distention, waveform within normal limits, carotid upstroke brisk and symmetric, no bruits, no thyromegaly LUNGS:  Clear to auscultation bilaterally BACK:  No CVA tenderness CHEST:  Unremarkable HEART:  PMI not displaced or sustained,S1 and S2 within normal limits, no S3, no S4, no clicks, no rubs, no murmurs ABD:  Flat, positive bowel sounds normal in frequency in pitch, no bruits, no rebound, no guarding, no midline pulsatile mass, no hepatomegaly, no splenomegaly EXT:  2 plus pulses upper and decreased DP/PT bilateral lower, no edema, no cyanosis no clubbing, left femoral bruit  EKG:  EKG is ordered today. The ekg ordered today demonstrates sinus rhythm, rate 40 right bundle branch block, left anterior fascicular block, premature ventricular contraction, first-degree AV block   Recent Labs: No results found for requested labs within last 8760 hours.      Wt Readings  from Last 3 Encounters:  08/31/19 151 lb 9.6 oz (68.8 kg)  09/05/18 146 lb 6.4 oz (66.4 kg)  03/10/18 140 lb (63.5 kg)      Other studies Reviewed: Additional studies/ records that were reviewed today include: None. Review of the above records demonstrates:  NA   ASSESSMENT AND PLAN:  CARDIOMOYPATHY -  He has class I symptoms for nonischemic cardiomyopathy.  He will continue with the meds as listed.  Heart rate would not allow med titration.    PVD -  The patient has no symptoms related to this.  He is followed at the [place].  HYPERTENSION -  This is at target.  No change in therapy.   HYPERLIPIDEMIA - He says his lipids are followed at the [place] and he understands the goal is an LDL less than 70.   BRADYCARDIA -  He has no symptoms.  We talked about the fact that  should he ever have any presyncope or syncope he needs to let me know immediately.  At this point there is no indication for pacing.   TOBACCO - He understands the need to stop smoking.  AAA - He has a small infrarenal AAA.  He has is followed at the [place] by his report.  COVID ED-    We talked about the vaccine.   Current medicines are reviewed at length with the patient today.  The patient does not have concerns regarding medicines.  The following changes have been made:  no change  Labs/ tests ordered today include: None  Orders Placed This Encounter  Procedures  . EKG 12-Lead     Disposition:   FU with me in one year.     Signed, Minus Breeding, MD  08/31/2019 10:41 AM    [facility]

## 2019-08-31 ENCOUNTER — Other Ambulatory Visit: Payer: Self-pay

## 2019-08-31 ENCOUNTER — Ambulatory Visit (INDEPENDENT_AMBULATORY_CARE_PROVIDER_SITE_OTHER): Payer: Medicare Other | Admitting: Cardiology

## 2019-08-31 ENCOUNTER — Encounter: Payer: Self-pay | Admitting: Cardiology

## 2019-08-31 VITALS — BP 126/60 | HR 64 | Temp 97.7°F | Ht 70.5 in | Wt 151.6 lb

## 2019-08-31 DIAGNOSIS — I739 Peripheral vascular disease, unspecified: Secondary | ICD-10-CM | POA: Diagnosis not present

## 2019-08-31 DIAGNOSIS — I420 Dilated cardiomyopathy: Secondary | ICD-10-CM | POA: Diagnosis not present

## 2019-08-31 DIAGNOSIS — I10 Essential (primary) hypertension: Secondary | ICD-10-CM | POA: Diagnosis not present

## 2019-08-31 DIAGNOSIS — Z7189 Other specified counseling: Secondary | ICD-10-CM

## 2019-08-31 DIAGNOSIS — I714 Abdominal aortic aneurysm, without rupture, unspecified: Secondary | ICD-10-CM

## 2019-08-31 DIAGNOSIS — R001 Bradycardia, unspecified: Secondary | ICD-10-CM | POA: Diagnosis not present

## 2019-08-31 DIAGNOSIS — Z720 Tobacco use: Secondary | ICD-10-CM

## 2019-08-31 DIAGNOSIS — E785 Hyperlipidemia, unspecified: Secondary | ICD-10-CM

## 2019-08-31 NOTE — Patient Instructions (Signed)
Medication Instructions:  No changes *If you need a refill on your cardiac medications before your next appointment, please call your pharmacy*  Lab Work: None ordered  Testing/Procedures: None ordered  Follow-Up: At [facility], you and your health needs are our priority.  As part of our continuing mission to provide you with exceptional heart care, we have created designated Provider Care Teams.  These Care Teams include your primary Cardiologist (physician) and Advanced Practice Providers (APPs -  Physician Assistants and Nurse Practitioners) who all work together to provide you with the care you need, when you need it.  Your next appointment:   1 year(s)  The format for your next appointment:   In Person  Provider:   Minus Breeding, MD

## 2020-06-06 DIAGNOSIS — Z23 Encounter for immunization: Secondary | ICD-10-CM | POA: Diagnosis not present

## 2020-06-30 ENCOUNTER — Inpatient Hospital Stay (HOSPITAL_COMMUNITY)
Admission: EM | Admit: 2020-06-30 | Discharge: 2020-07-02 | DRG: 291 | Disposition: A | Discharge status: Home / Self Care | Payer: Medicare Other | Attending: Internal Medicine | Admitting: Internal Medicine

## 2020-06-30 ENCOUNTER — Other Ambulatory Visit: Payer: Self-pay

## 2020-06-30 ENCOUNTER — Encounter (HOSPITAL_COMMUNITY): Payer: Self-pay

## 2020-06-30 DIAGNOSIS — Z947 Corneal transplant status: Secondary | ICD-10-CM

## 2020-06-30 DIAGNOSIS — D696 Thrombocytopenia, unspecified: Secondary | ICD-10-CM | POA: Diagnosis present

## 2020-06-30 DIAGNOSIS — I493 Ventricular premature depolarization: Secondary | ICD-10-CM | POA: Diagnosis present

## 2020-06-30 DIAGNOSIS — R7989 Other specified abnormal findings of blood chemistry: Secondary | ICD-10-CM | POA: Diagnosis present

## 2020-06-30 DIAGNOSIS — J90 Pleural effusion, not elsewhere classified: Secondary | ICD-10-CM | POA: Diagnosis not present

## 2020-06-30 DIAGNOSIS — J9621 Acute and chronic respiratory failure with hypoxia: Secondary | ICD-10-CM | POA: Diagnosis not present

## 2020-06-30 DIAGNOSIS — R918 Other nonspecific abnormal finding of lung field: Secondary | ICD-10-CM | POA: Diagnosis present

## 2020-06-30 DIAGNOSIS — I509 Heart failure, unspecified: Secondary | ICD-10-CM

## 2020-06-30 DIAGNOSIS — I5042 Chronic combined systolic (congestive) and diastolic (congestive) heart failure: Secondary | ICD-10-CM | POA: Diagnosis present

## 2020-06-30 DIAGNOSIS — I5043 Acute on chronic combined systolic (congestive) and diastolic (congestive) heart failure: Secondary | ICD-10-CM | POA: Diagnosis not present

## 2020-06-30 DIAGNOSIS — Z7982 Long term (current) use of aspirin: Secondary | ICD-10-CM

## 2020-06-30 DIAGNOSIS — I10 Essential (primary) hypertension: Secondary | ICD-10-CM | POA: Diagnosis present

## 2020-06-30 DIAGNOSIS — I4510 Unspecified right bundle-branch block: Secondary | ICD-10-CM | POA: Diagnosis present

## 2020-06-30 DIAGNOSIS — Z20822 Contact with and (suspected) exposure to covid-19: Secondary | ICD-10-CM | POA: Diagnosis present

## 2020-06-30 DIAGNOSIS — Z79899 Other long term (current) drug therapy: Secondary | ICD-10-CM

## 2020-06-30 DIAGNOSIS — I2510 Atherosclerotic heart disease of native coronary artery without angina pectoris: Secondary | ICD-10-CM | POA: Diagnosis present

## 2020-06-30 DIAGNOSIS — D7589 Other specified diseases of blood and blood-forming organs: Secondary | ICD-10-CM | POA: Diagnosis present

## 2020-06-30 DIAGNOSIS — N1831 Chronic kidney disease, stage 3a: Secondary | ICD-10-CM | POA: Diagnosis not present

## 2020-06-30 DIAGNOSIS — J9601 Acute respiratory failure with hypoxia: Secondary | ICD-10-CM

## 2020-06-30 DIAGNOSIS — I499 Cardiac arrhythmia, unspecified: Secondary | ICD-10-CM | POA: Diagnosis not present

## 2020-06-30 DIAGNOSIS — I491 Atrial premature depolarization: Secondary | ICD-10-CM | POA: Diagnosis not present

## 2020-06-30 DIAGNOSIS — I130 Hypertensive heart and chronic kidney disease with heart failure and stage 1 through stage 4 chronic kidney disease, or unspecified chronic kidney disease: Secondary | ICD-10-CM | POA: Diagnosis not present

## 2020-06-30 DIAGNOSIS — R000 Tachycardia, unspecified: Secondary | ICD-10-CM | POA: Diagnosis not present

## 2020-06-30 DIAGNOSIS — R52 Pain, unspecified: Secondary | ICD-10-CM | POA: Diagnosis not present

## 2020-06-30 DIAGNOSIS — H409 Unspecified glaucoma: Secondary | ICD-10-CM | POA: Diagnosis present

## 2020-06-30 DIAGNOSIS — I088 Other rheumatic multiple valve diseases: Secondary | ICD-10-CM | POA: Diagnosis present

## 2020-06-30 DIAGNOSIS — J80 Acute respiratory distress syndrome: Secondary | ICD-10-CM | POA: Diagnosis not present

## 2020-06-30 DIAGNOSIS — J810 Acute pulmonary edema: Secondary | ICD-10-CM

## 2020-06-30 DIAGNOSIS — Z87891 Personal history of nicotine dependence: Secondary | ICD-10-CM

## 2020-06-30 DIAGNOSIS — I714 Abdominal aortic aneurysm, without rupture: Secondary | ICD-10-CM | POA: Diagnosis present

## 2020-06-30 DIAGNOSIS — I110 Hypertensive heart disease with heart failure: Secondary | ICD-10-CM | POA: Diagnosis not present

## 2020-06-30 DIAGNOSIS — I420 Dilated cardiomyopathy: Secondary | ICD-10-CM | POA: Diagnosis present

## 2020-06-30 MED ORDER — FUROSEMIDE 10 MG/ML IJ SOLN
60.0000 mg | Freq: Once | INTRAMUSCULAR | Status: AC
  Administered 2020-07-01: 60 mg via INTRAVENOUS
  Filled 2020-06-30: qty 6

## 2020-06-30 MED ORDER — NITROGLYCERIN IN D5W 200-5 MCG/ML-% IV SOLN
0.0000 ug/min | INTRAVENOUS | Status: DC
  Filled 2020-06-30: qty 250

## 2020-06-30 MED ORDER — ASPIRIN 81 MG PO CHEW
324.0000 mg | CHEWABLE_TABLET | Freq: Once | ORAL | Status: AC
  Administered 2020-07-01: 324 mg via ORAL
  Filled 2020-06-30: qty 4

## 2020-06-30 NOTE — ED Notes (Addendum)
Respiratory at bedside with bipap

## 2020-06-30 NOTE — ED Provider Notes (Signed)
[facility] EMERGENCY DEPARTMENT Provider Note   CSN: [id number] Arrival date & time: 06/30/20  2333     History Chief Complaint  Patient presents with  . Shortness of Breath  . Abnormal ECG    [name] is a 74 y.o. male.   Shortness of Breath Severity:  Moderate Onset quality:  Gradual Timing:  Constant Progression:  Worsening Chronicity:  New Context comment:  Unclear cause Relieved by:  Nothing Worsened by:  Nothing Ineffective treatments:  None tried Associated symptoms: cough and sputum production   Associated symptoms: no chest pain, no diaphoresis, no fever, no headaches, no rash and no vomiting   Risk factors comment:  Hx of chf      Past Medical History:  Diagnosis Date  . ABDOMINAL AORTIC ANEURYSM   . CAD    Non obstructive (mild) cath 2007  . CHF    EF 15% in 2007, 25% currently  . GLAUCOMA   . HYPERTENSION   . TOBACCO ABUSE    Previous    Patient Active Problem List   Diagnosis Date Noted  . Acute on chronic combined systolic and diastolic CHF (congestive heart failure) ([place]) 07/01/2020  . Acute on chronic respiratory failure with hypoxia ([place]) 07/01/2020  . Chronic kidney disease, stage 3a ([place]) 07/01/2020  . Thrombocytopenia ([place]) 07/01/2020  . Positive D dimer 07/01/2020  . PVD (peripheral vascular disease) ([place]) 08/29/2019  . Educated about COVID-19 virus infection 08/29/2019  . Bradycardia 09/05/2018  . Dyslipidemia 09/05/2018  . AAA (abdominal aortic aneurysm) without rupture ([place]) 09/05/2018  . Heart block 03/10/2018  . Cardiomyopathy ([place]) 10/29/2017  . TOBACCO ABUSE 11/30/2008  . CHRONIC SYSTOLIC HEART FAILURE 12/45/8099  . ABDOMINAL AORTIC ANEURYSM 11/30/2008  . GLAUCOMA 11/29/2008  . Essential hypertension 11/29/2008  . Coronary atherosclerosis 11/29/2008  . VENTRICULAR TACHYCARDIA 11/29/2008  . CHF 11/29/2008    Past Surgical History:  Procedure Laterality Date  . Clavical Repair    . CORNEAL  TRANSPLANT     Right eye x 2  . EYE SURGERY     Glaucoma       History reviewed. No pertinent family history.  Social History   Tobacco Use  . Smoking status: Former Smoker    Quit date: 03/08/2008    Years since quitting: 12.3  . Smokeless tobacco: Never Used  Substance Use Topics  . Alcohol use: No  . Drug use: No    Home Medications Prior to Admission medications   Medication Sig Start Date End Date Taking? Authorizing Provider  aspirin 81 MG tablet Take 81 mg by mouth daily.   Yes [provider]  cholecalciferol (VITAMIN D) 1000 UNITS tablet Take 1,000 Units by mouth 2 (two) times daily.    Yes [provider]  furosemide (LASIX) 20 MG tablet Take 20 mg by mouth.   Yes [provider]  metoprolol succinate (TOPROL-XL) 100 MG 24 hr tablet Take 50 mg by mouth daily. Take with or immediately following a meal.   Yes [provider]  sacubitril-valsartan (ENTRESTO) 97-103 MG Take 1 tablet by mouth 2 (two) times daily. 05/19/18  Yes Minus Breeding, MD    Allergies    Patient has no known allergies.  Review of Systems   Review of Systems  Constitutional: Negative for chills, diaphoresis and fever.  HENT: Negative for congestion and rhinorrhea.   Respiratory: Positive for cough, sputum production and shortness of breath.   Cardiovascular: Negative for chest pain, palpitations and leg swelling.  Gastrointestinal: Negative for diarrhea, nausea and vomiting.  Genitourinary: Negative for difficulty urinating and dysuria.  Musculoskeletal: Negative for arthralgias and back pain.  Skin: Negative for color change and rash.  Neurological: Negative for light-headedness and headaches.    Physical Exam Updated Vital Signs BP 126/90 (BP Location: Right Arm)   Pulse 70   Temp (!) 97.4 F (36.3 C) (Oral)   Resp 10   SpO2 97%   Physical Exam Vitals and nursing note reviewed.  Constitutional:      General: He is in acute distress.      Appearance: Normal appearance. He is ill-appearing.  HENT:     Head: Normocephalic and atraumatic.     Nose: No rhinorrhea.  Eyes:     General:        Right eye: No discharge.        Left eye: No discharge.     Conjunctiva/sclera: Conjunctivae normal.  Cardiovascular:     Rate and Rhythm: Normal rate and regular rhythm.  Pulmonary:     Effort: Pulmonary effort is normal. Tachypnea present.     Breath sounds: No stridor. Wheezing and rales present.  Chest:     Chest wall: No mass or tenderness.  Abdominal:     General: Abdomen is flat. There is no distension.     Palpations: Abdomen is soft. There is no hepatomegaly.  Musculoskeletal:        General: No deformity or signs of injury.     Right lower leg: No edema.     Left lower leg: No edema.  Skin:    General: Skin is warm and dry.  Neurological:     General: No focal deficit present.     Mental Status: He is alert. Mental status is at baseline.     Motor: No weakness.  Psychiatric:        Mood and Affect: Mood normal.        Behavior: Behavior normal.        Thought Content: Thought content normal.     ED Results / Procedures / Treatments   Labs (all labs ordered are listed, but only abnormal results are displayed) Labs Reviewed  CBC WITH DIFFERENTIAL/PLATELET - Abnormal; Notable for the following components:      Result Value   RBC 4.12 (*)    MCV 101.7 (*)    Platelets 104 (*)    All other components within normal limits  BASIC METABOLIC PANEL - Abnormal; Notable for the following components:   CO2 21 (*)    Glucose, Bld 169 (*)    Creatinine, Ser 1.48 (*)    Calcium 8.4 (*)    GFR, Estimated 50 (*)    All other components within normal limits  BRAIN NATRIURETIC PEPTIDE - Abnormal; Notable for the following components:   B Natriuretic Peptide 3,205.6 (*)    All other components within normal limits  D-DIMER, QUANTITATIVE (NOT AT [facility]) - Abnormal; Notable for the following components:   D-Dimer, Quant 10.31  (*)    All other components within normal limits  TROPONIN I (HIGH SENSITIVITY) - Abnormal; Notable for the following components:   Troponin I (High Sensitivity) 56 (*)    All other components within normal limits  RESPIRATORY PANEL BY RT PCR (FLU A&B, COVID)  VITAMIN B12  FOLATE  COPPER, SERUM  BASIC METABOLIC PANEL  CBC  MAGNESIUM  TROPONIN I (HIGH SENSITIVITY)    EKG EKG Interpretation  Date/Time:  Monday July 01 2020 01:23:26  EST Ventricular Rate:  77 PR Interval:    QRS Duration: 162 QT Interval:  463 QTC Calculation: 525 R Axis:   -87 Text Interpretation: Sinus rhythm Multiform ventricular premature complexes Prolonged PR interval Probable left atrial enlargement IVCD, consider atypical RBBB Probable inferior infarct, acute Anterior infarct, old >>> Acute MI <<< Confirmed by Dewaine Conger [phone]) on 07/01/2020 1:40:55 AM   Radiology DG Chest Portable 1 View  Result Date: 07/01/2020 CLINICAL DATA:  Shortness of breath, runs of ventricular tachycardia, first-degree block EXAM: PORTABLE CHEST 1 VIEW COMPARISON:  CT 05/24/2006, radiograph 04/09/2006 FINDINGS: Diffuse hazy opacities in both lungs with some marked central vascular congestion indistinct pulmonary vascularity. Peripheral septal lines and fissural thickening are noted as well. Trace bilateral effusions. No pneumothorax. Cardiac silhouette is likely enlarged accounting for portable technique with a calcified aorta. Mild body wall edema. No other acute osseous or soft tissue abnormality. Telemetry leads overlie the chest. Remote left clavicular resection. IMPRESSION: Features most compatible with CHF/volume overload with cardiomegaly, edema and bilateral effusions. Electronically Signed   By: Lovena Le M.D.   On: 07/01/2020 00:30    Procedures Ultrasound ED Echo  Date/Time: 07/01/2020 12:05 AM Performed by: Breck Coons, MD Authorized by: Breck Coons, MD   Procedure details:    Indications: dyspnea     Views:  parasternal long axis view, apical 4 chamber view and IVC view     Images: archived     Limitations:  Patient compliance Findings:    Pericardium: no pericardial effusion     LV Function: depressed (30 - 50%)     IVC: dilated   Impression:    Impression: probable elevated CVP and decreased contractility   .Critical Care Performed by: Breck Coons, MD Authorized by: Breck Coons, MD   Critical care provider statement:    Critical care time (minutes):  60   Critical care was necessary to treat or prevent imminent or life-threatening deterioration of the following conditions:  Cardiac failure and respiratory failure   Critical care was time spent personally by me on the following activities:  Discussions with consultants, evaluation of patient's response to treatment, examination of patient, ordering and performing treatments and interventions, ordering and review of laboratory studies, ordering and review of radiographic studies, pulse oximetry, re-evaluation of patient's condition, obtaining history from patient or surrogate, review of old charts, blood draw for specimens and development of treatment plan with patient or surrogate   (including critical care time)  Medications Ordered in ED Medications  aspirin EC tablet 81 mg (has no administration in time range)  sacubitril-valsartan (ENTRESTO) 97-103 mg per tablet (has no administration in time range)  metoprolol succinate (TOPROL-XL) 24 hr tablet 50 mg (has no administration in time range)  sodium chloride flush (NS) 0.9 % injection 3 mL (has no administration in time range)  sodium chloride flush (NS) 0.9 % injection 3 mL (has no administration in time range)  0.9 %  sodium chloride infusion (has no administration in time range)  acetaminophen (TYLENOL) tablet 650 mg (has no administration in time range)  ondansetron (ZOFRAN) injection 4 mg (has no administration in time range)  enoxaparin (LOVENOX) injection 40 mg (has no  administration in time range)  furosemide (LASIX) injection 40 mg (has no administration in time range)  furosemide (LASIX) injection 60 mg (60 mg Intravenous Given 07/01/20 0023)  aspirin chewable tablet 324 mg (324 mg Oral Given 07/01/20 0024)    ED Course  I have reviewed  the triage vital signs and the nursing notes.  Pertinent labs & imaging results that were available during my care of the patient were reviewed by me and considered in my medical decision making (see chart for details).    MDM Rules/Calculators/A&P                          History of congestive heart failure worsening shortness of breath of the last few days.  EKG transmitted shows right bundle branch block, signs of chronic cardiac injury likely secondary to hypertension, patient is mildly hypertensive with diastolics over 169.  Nitroglycerin infusion will be started, patient is placed on BiPAP due to respiratory distress.  Was initially satting 75% with EMS nonrebreather was applied got his O2 sats up.  No history of COPD.  Review of his medical chart shows no breathing treatments needed at home.  He is sounds on exam volume overloaded, he will be diuresed, cardiac biomarkers and other labs will be sent.  Covid screening and D-dimer sent as well. Aspirin given.   Patient is feeling much improved on BiPAP, nitroglycerin is at bedside but his blood pressure is stabilized with diastolics under 678 and systolics under 938, that is her goal.  Chest x-ray reviewed by radiology myself shows cardiomegaly and signs of pulmonary edema, consistent with bedside ultrasound done by me showing B-lines and volume overload with the dilated IVC.  Troponin is elevated however patient denies chest pains this may be a chronic elevation in the setting of congestive heart failure.  Will get serial troponins.  D-dimer is elevated, will get a CT PE study and will admit the patient to the hospital.  Patient agrees to the plan thus far.  The patient BNP  is markedly elevated.  Consulted the hospitalist for admission to their stepdown unit and agreed to take the patient.  They will result the CT PE scan, they will continue to trend the troponins.  Patient agrees and is comfortable with the plan.  CRITICAL CARE Performed by: Breck Coons   Total critical care time: 60 minutes  Critical care time was exclusive of separately billable procedures and treating other patients.  Critical care was necessary to treat or prevent imminent or life-threatening deterioration.  Critical care was time spent personally by me on the following activities: development of treatment plan with patient and/or surrogate as well as nursing, discussions with consultants, evaluation of patient's response to treatment, examination of patient, obtaining history from patient or surrogate, ordering and performing treatments and interventions, ordering and review of laboratory studies, ordering and review of radiographic studies, pulse oximetry and re-evaluation of patient's condition.  Final Clinical Impression(s) / ED Diagnoses Final diagnoses:  Acute hypoxemic respiratory failure ([place])  Acute on chronic congestive heart failure, unspecified heart failure type ([place])  Acute pulmonary edema ([place])  Hypertension, unspecified type    Rx / DC Orders ED Discharge Orders    None       Breck Coons, MD 07/01/20 [phone]

## 2020-06-30 NOTE — ED Triage Notes (Signed)
Per [facility], pt coming from home reporting sob, ems saw runs of vtach but unable to capture,  current 12 lead sinus with 1st degree block, with elevation in 2,3.. Pt aao4, vs: 148/90, hr 92, 75% on room air. Pt placed on NRB, 16g in left AC.

## 2020-06-30 NOTE — ED Notes (Addendum)
MD at bedside with uls machine. Respiratory Called for bipap

## 2020-07-01 ENCOUNTER — Inpatient Hospital Stay (HOSPITAL_COMMUNITY): Payer: Medicare Other

## 2020-07-01 ENCOUNTER — Encounter (HOSPITAL_COMMUNITY): Payer: Self-pay | Admitting: Family Medicine

## 2020-07-01 ENCOUNTER — Emergency Department (HOSPITAL_COMMUNITY): Payer: Medicare Other

## 2020-07-01 DIAGNOSIS — I714 Abdominal aortic aneurysm, without rupture: Secondary | ICD-10-CM | POA: Diagnosis present

## 2020-07-01 DIAGNOSIS — R7989 Other specified abnormal findings of blood chemistry: Secondary | ICD-10-CM | POA: Diagnosis not present

## 2020-07-01 DIAGNOSIS — R0602 Shortness of breath: Secondary | ICD-10-CM | POA: Diagnosis not present

## 2020-07-01 DIAGNOSIS — J9601 Acute respiratory failure with hypoxia: Secondary | ICD-10-CM | POA: Diagnosis not present

## 2020-07-01 DIAGNOSIS — R911 Solitary pulmonary nodule: Secondary | ICD-10-CM | POA: Diagnosis not present

## 2020-07-01 DIAGNOSIS — I5042 Chronic combined systolic (congestive) and diastolic (congestive) heart failure: Secondary | ICD-10-CM | POA: Diagnosis present

## 2020-07-01 DIAGNOSIS — N1831 Chronic kidney disease, stage 3a: Secondary | ICD-10-CM | POA: Diagnosis present

## 2020-07-01 DIAGNOSIS — I2510 Atherosclerotic heart disease of native coronary artery without angina pectoris: Secondary | ICD-10-CM | POA: Diagnosis present

## 2020-07-01 DIAGNOSIS — I110 Hypertensive heart disease with heart failure: Secondary | ICD-10-CM | POA: Diagnosis not present

## 2020-07-01 DIAGNOSIS — I5043 Acute on chronic combined systolic (congestive) and diastolic (congestive) heart failure: Secondary | ICD-10-CM

## 2020-07-01 DIAGNOSIS — I5021 Acute systolic (congestive) heart failure: Secondary | ICD-10-CM

## 2020-07-01 DIAGNOSIS — D696 Thrombocytopenia, unspecified: Secondary | ICD-10-CM | POA: Diagnosis present

## 2020-07-01 DIAGNOSIS — R000 Tachycardia, unspecified: Secondary | ICD-10-CM | POA: Diagnosis not present

## 2020-07-01 DIAGNOSIS — N281 Cyst of kidney, acquired: Secondary | ICD-10-CM | POA: Diagnosis not present

## 2020-07-01 DIAGNOSIS — Z79899 Other long term (current) drug therapy: Secondary | ICD-10-CM | POA: Diagnosis not present

## 2020-07-01 DIAGNOSIS — H409 Unspecified glaucoma: Secondary | ICD-10-CM | POA: Diagnosis present

## 2020-07-01 DIAGNOSIS — I4510 Unspecified right bundle-branch block: Secondary | ICD-10-CM | POA: Diagnosis present

## 2020-07-01 DIAGNOSIS — Z20822 Contact with and (suspected) exposure to covid-19: Secondary | ICD-10-CM | POA: Diagnosis present

## 2020-07-01 DIAGNOSIS — J9621 Acute and chronic respiratory failure with hypoxia: Secondary | ICD-10-CM | POA: Diagnosis present

## 2020-07-01 DIAGNOSIS — J90 Pleural effusion, not elsewhere classified: Secondary | ICD-10-CM | POA: Diagnosis not present

## 2020-07-01 DIAGNOSIS — I088 Other rheumatic multiple valve diseases: Secondary | ICD-10-CM | POA: Diagnosis present

## 2020-07-01 DIAGNOSIS — I493 Ventricular premature depolarization: Secondary | ICD-10-CM | POA: Diagnosis present

## 2020-07-01 DIAGNOSIS — I509 Heart failure, unspecified: Secondary | ICD-10-CM | POA: Diagnosis not present

## 2020-07-01 DIAGNOSIS — J811 Chronic pulmonary edema: Secondary | ICD-10-CM | POA: Diagnosis not present

## 2020-07-01 DIAGNOSIS — I420 Dilated cardiomyopathy: Secondary | ICD-10-CM | POA: Diagnosis present

## 2020-07-01 DIAGNOSIS — I130 Hypertensive heart and chronic kidney disease with heart failure and stage 1 through stage 4 chronic kidney disease, or unspecified chronic kidney disease: Secondary | ICD-10-CM | POA: Diagnosis present

## 2020-07-01 DIAGNOSIS — I517 Cardiomegaly: Secondary | ICD-10-CM | POA: Diagnosis not present

## 2020-07-01 DIAGNOSIS — Z7982 Long term (current) use of aspirin: Secondary | ICD-10-CM | POA: Diagnosis not present

## 2020-07-01 DIAGNOSIS — D7589 Other specified diseases of blood and blood-forming organs: Secondary | ICD-10-CM | POA: Diagnosis present

## 2020-07-01 DIAGNOSIS — Z87891 Personal history of nicotine dependence: Secondary | ICD-10-CM | POA: Diagnosis not present

## 2020-07-01 DIAGNOSIS — Z947 Corneal transplant status: Secondary | ICD-10-CM | POA: Diagnosis not present

## 2020-07-01 DIAGNOSIS — R918 Other nonspecific abnormal finding of lung field: Secondary | ICD-10-CM | POA: Diagnosis present

## 2020-07-01 DIAGNOSIS — J810 Acute pulmonary edema: Secondary | ICD-10-CM | POA: Diagnosis not present

## 2020-07-01 LAB — ECHOCARDIOGRAM COMPLETE
Area-P 1/2: 2.32 cm2
Calc EF: 35.9 %
Height: 71 in
MV M vel: 4.16 m/s
MV Peak grad: 69.2 mmHg
Radius: 0.5 cm
S' Lateral: 5.2 cm
Single Plane A2C EF: 41.6 %
Single Plane A4C EF: 31.2 %
Weight: 2400 oz

## 2020-07-01 LAB — BASIC METABOLIC PANEL
Anion gap: 10 (ref 5–15)
Anion gap: 6 (ref 5–15)
BUN: 17 mg/dL (ref 8–23)
BUN: 17 mg/dL (ref 8–23)
CO2: 21 mmol/L — ABNORMAL LOW (ref 22–32)
CO2: 25 mmol/L (ref 22–32)
Calcium: 8.4 mg/dL — ABNORMAL LOW (ref 8.9–10.3)
Calcium: 9.1 mg/dL (ref 8.9–10.3)
Chloride: 105 mmol/L (ref 98–111)
Chloride: 107 mmol/L (ref 98–111)
Creatinine, Ser: 1.42 mg/dL — ABNORMAL HIGH (ref 0.61–1.24)
Creatinine, Ser: 1.48 mg/dL — ABNORMAL HIGH (ref 0.61–1.24)
GFR, Estimated: 50 mL/min — ABNORMAL LOW (ref >60)
GFR, Estimated: 52 mL/min — ABNORMAL LOW (ref >60)
Glucose, Bld: 118 mg/dL — ABNORMAL HIGH (ref 70–99)
Glucose, Bld: 169 mg/dL — ABNORMAL HIGH (ref 70–99)
Potassium: 3.6 mmol/L (ref 3.5–5.1)
Potassium: 4 mmol/L (ref 3.5–5.1)
Sodium: 136 mmol/L (ref 135–145)
Sodium: 138 mmol/L (ref 135–145)

## 2020-07-01 LAB — CBC WITH DIFFERENTIAL/PLATELET
Abs Immature Granulocytes: 0.03 10*3/uL (ref 0.00–0.07)
Basophils Absolute: 0.1 10*3/uL (ref 0.0–0.1)
Basophils Relative: 1 %
Eosinophils Absolute: 0.2 10*3/uL (ref 0.0–0.5)
Eosinophils Relative: 2 %
HCT: 41.9 % (ref 39.0–52.0)
Hemoglobin: 13.8 g/dL (ref 13.0–17.0)
Immature Granulocytes: 0 %
Lymphocytes Relative: 41 %
Lymphs Abs: 2.8 10*3/uL (ref 0.7–4.0)
MCH: 33.5 pg (ref 26.0–34.0)
MCHC: 32.9 g/dL (ref 30.0–36.0)
MCV: 101.7 fL — ABNORMAL HIGH (ref 80.0–100.0)
Monocytes Absolute: 0.4 10*3/uL (ref 0.1–1.0)
Monocytes Relative: 6 %
Neutro Abs: 3.4 10*3/uL (ref 1.7–7.7)
Neutrophils Relative %: 50 %
Platelets: 104 10*3/uL — ABNORMAL LOW (ref 150–400)
RBC: 4.12 MIL/uL — ABNORMAL LOW (ref 4.22–5.81)
RDW: 15.5 % (ref 11.5–15.5)
WBC: 6.9 10*3/uL (ref 4.0–10.5)
nRBC: 0 % (ref 0.0–0.2)

## 2020-07-01 LAB — CBC
HCT: 43.3 % (ref 39.0–52.0)
Hemoglobin: 14.4 g/dL (ref 13.0–17.0)
MCH: 32.4 pg (ref 26.0–34.0)
MCHC: 33.3 g/dL (ref 30.0–36.0)
MCV: 97.3 fL (ref 80.0–100.0)
Platelets: 96 10*3/uL — ABNORMAL LOW (ref 150–400)
RBC: 4.45 MIL/uL (ref 4.22–5.81)
RDW: 15.3 % (ref 11.5–15.5)
WBC: 5.7 10*3/uL (ref 4.0–10.5)
nRBC: 0 % (ref 0.0–0.2)

## 2020-07-01 LAB — VITAMIN B12: Vitamin B-12: 586 pg/mL (ref 180–914)

## 2020-07-01 LAB — MAGNESIUM: Magnesium: 1.5 mg/dL — ABNORMAL LOW (ref 1.7–2.4)

## 2020-07-01 LAB — FOLATE: Folate: 12.3 ng/mL (ref >5.9)

## 2020-07-01 LAB — D-DIMER, QUANTITATIVE: D-Dimer, Quant: 10.31 ug/mL-FEU — ABNORMAL HIGH (ref 0.00–0.50)

## 2020-07-01 LAB — RESPIRATORY PANEL BY RT PCR (FLU A&B, COVID)
Influenza A by PCR: NEGATIVE
Influenza B by PCR: NEGATIVE
SARS Coronavirus 2 by RT PCR: NEGATIVE

## 2020-07-01 LAB — TROPONIN I (HIGH SENSITIVITY)
Troponin I (High Sensitivity): 52 ng/L — ABNORMAL HIGH (ref <18)
Troponin I (High Sensitivity): 56 ng/L — ABNORMAL HIGH (ref <18)

## 2020-07-01 LAB — BRAIN NATRIURETIC PEPTIDE: B Natriuretic Peptide: 3205.6 pg/mL — ABNORMAL HIGH (ref 0.0–100.0)

## 2020-07-01 MED ORDER — FUROSEMIDE 10 MG/ML IJ SOLN
40.0000 mg | Freq: Two times a day (BID) | INTRAMUSCULAR | Status: DC
  Administered 2020-07-01 – 2020-07-02 (×3): 40 mg via INTRAVENOUS
  Filled 2020-07-01 (×3): qty 4

## 2020-07-01 MED ORDER — ASPIRIN EC 81 MG PO TBEC
81.0000 mg | DELAYED_RELEASE_TABLET | Freq: Every day | ORAL | Status: DC
  Administered 2020-07-01 – 2020-07-02 (×2): 81 mg via ORAL
  Filled 2020-07-01 (×2): qty 1

## 2020-07-01 MED ORDER — SODIUM CHLORIDE 0.9 % IV SOLN: 250.0000 mL | INTRAVENOUS | Status: DC | PRN

## 2020-07-01 MED ORDER — IOHEXOL 350 MG/ML SOLN
100.0000 mL | Freq: Once | INTRAVENOUS | Status: AC | PRN
  Administered 2020-07-01: 100 mL via INTRAVENOUS

## 2020-07-01 MED ORDER — MAGNESIUM SULFATE 2 GM/50ML IV SOLN
2.0000 g | Freq: Once | INTRAVENOUS | Status: AC
  Administered 2020-07-01: 2 g via INTRAVENOUS
  Filled 2020-07-01: qty 50

## 2020-07-01 MED ORDER — PERFLUTREN LIPID MICROSPHERE
1.0000 mL | INTRAVENOUS | Status: AC | PRN
  Administered 2020-07-01: 2.5 mL via INTRAVENOUS
  Filled 2020-07-01: qty 10

## 2020-07-01 MED ORDER — SODIUM CHLORIDE 0.9% FLUSH
3.0000 mL | Freq: Two times a day (BID) | INTRAVENOUS | Status: DC
  Administered 2020-07-01 – 2020-07-02 (×3): 3 mL via INTRAVENOUS

## 2020-07-01 MED ORDER — ENOXAPARIN SODIUM 40 MG/0.4ML ~~LOC~~ SOLN
40.0000 mg | Freq: Every day | SUBCUTANEOUS | Status: DC
  Administered 2020-07-02: 40 mg via SUBCUTANEOUS
  Filled 2020-07-01 (×2): qty 0.4

## 2020-07-01 MED ORDER — METOPROLOL SUCCINATE ER 50 MG PO TB24
50.0000 mg | ORAL_TABLET | Freq: Every day | ORAL | Status: DC
  Administered 2020-07-01 – 2020-07-02 (×2): 50 mg via ORAL
  Filled 2020-07-01: qty 1
  Filled 2020-07-01: qty 2

## 2020-07-01 MED ORDER — IPRATROPIUM-ALBUTEROL 0.5-2.5 (3) MG/3ML IN SOLN: 3.0000 mL | Freq: Four times a day (QID) | RESPIRATORY_TRACT | Status: DC | PRN

## 2020-07-01 MED ORDER — SODIUM CHLORIDE 0.9% FLUSH: 3.0000 mL | INTRAVENOUS | Status: DC | PRN

## 2020-07-01 MED ORDER — ONDANSETRON HCL 4 MG/2ML IJ SOLN: 4.0000 mg | Freq: Four times a day (QID) | INTRAMUSCULAR | Status: DC | PRN

## 2020-07-01 MED ORDER — SACUBITRIL-VALSARTAN 97-103 MG PO TABS
1.0000 | ORAL_TABLET | Freq: Two times a day (BID) | ORAL | Status: DC
  Administered 2020-07-01 – 2020-07-02 (×2): 1 via ORAL
  Filled 2020-07-01 (×4): qty 1

## 2020-07-01 MED ORDER — ACETAMINOPHEN 325 MG PO TABS: 650.0000 mg | ORAL_TABLET | ORAL | Status: DC | PRN

## 2020-07-01 NOTE — Progress Notes (Signed)
RN asked for patient to be placed back on Bipap per the patient request.  When RT arrived, patients sats 96% and patient not in distress.  RT got ready to place patient on Bipap and asked the patient if he wore dentures.  Patient stated he was wearing dentures. RT asked patient to remove them for safety and the patient refused to. Then the patient decided he would just wear the  at this time. RN notified. Bipap at bedside.

## 2020-07-01 NOTE — Progress Notes (Addendum)
Pt removed from bipap at this time for trial.  Bipap at bedside if pt needs again. Pt sat 98% on RA, no distress and stating he feels much better. RT will continue to monitor.

## 2020-07-01 NOTE — ED Notes (Signed)
Hospitalist downgraded bed to tele

## 2020-07-01 NOTE — Progress Notes (Signed)
PROGRESS NOTE  [name]  DOB: 06-15-1946  PCP: Linus Mako, NP JIR:[id number]  DOA: 06/30/2020  LOS: 0 days   Chief Complaint  Patient presents with  . Shortness of Breath  . Abnormal ECG   Brief narrative: [name] is a 74 y.o. male with PMH significant for HTN, CKD 3a, dilated cardiomyopathy with EF 20 to 25%.  Patient presented to the ED on 11/7 with complaint of progressively worsening shortness of breath over the last few days and acutely worse in the few hours prior to presentation.  Patient states he has CHF since 1970s, compliant to medications and has not been hospitalized for exacerbation for several years.  Follows up with cardiologist at [facility].  In the ED, patient's oxygen saturation was low at 75%. BNP elevated to 3200. Chest x-ray showed cardiomegaly and pulmonary edema with bilateral effusions. CT angio of chest ruled out pulmonary lesion and confirmed pulmonary edema. Patient was placed on BiPAP and given IV Lasix 60 mg. He was admitted under hospitalist service for acute exacerbation of congestive heart failure.   Subjective: Patient was seen and examined this morning. Pleasant elderly African-American male.  Lying down in bed.  On nasal cannula.  Feels better. Chart reviewed. No fever, transitioned this morning from BiPAP to nasal cannula at 3 L Blood pressure stable  Assessment/Plan: Acute on chronic combined systolic & diastolic CHF Essential hypertension -Presented with worsening shortness of breath, orthopnea, hypoxia, pulmonary edema and chest x-ray and elevated BNP.   -Echo from 2019 with EF 20 to 75%, grade 2 diastolic dysfunction. -New echo ordered. -Lasix 60 mg IV given in the ED.  Currently on 40 mg IV twice daily. -Continue to monitor intake output, daily weight, blood pressure, renal function electrolytes. -Home meds include Lasix 20 mg daily, metoprolol succinate 50 mg daily, Entresto 97/103 mg twice daily. -Continue Toprol  and Entresto.  Acute respiratory failure with hypoxia -Secondary to CHF exacerbation. -Initially required BiPAP.  Currently on 3 L oxygen by nasal collar.  Expected to improve with further diuresis  Hypomagnesemia -Replacement ordered.  Continue to monitor Recent Labs  Lab 06/30/20 2342 07/01/20 0524  K 3.6 4.0  MG  --  1.5*   CKD 3a -Creatinine stable at baseline.  Continue to monitor with diuresis. Recent Labs    06/30/20 2342 07/01/20 0524  BUN 17 17  CREATININE 1.48* 1.42*   Elevated d-dimer  -D-dimer elevated to 10.31 but CTA chest negative for PE -Obtain DVT scan of lower extremities for completion of work-up  Thrombocytopenia  -Platelets 104k on admission, previously normal  -No infectious s/s, no bleeding   -Currently on aspirin 81 mg daily.  Continue to monitor for bleeding.  CKD IIIa  -SCr is 1.48 on admission, similar to priors  -Renally-dose medications, monitor renal function and electrolyte while diuresing    Bilateral pulmonary nodules -per CT chest, patient has bilateral pulmonologist measuring up to 7 mm and not all of which are stable since 2007. Non-contrast chest CT at 3-6 months is recommended.  Mobility: Encourage ambulation.  Independent prior to presentation Code Status:   Code Status: Full Code  Nutritional status: There is no height or weight on file to calculate BMI.     Diet Order            Diet Heart Room service appropriate? Yes; Fluid consistency: Thin; Fluid restriction: 1500 mL Fluid  Diet effective now  DVT prophylaxis: enoxaparin (LOVENOX) injection 40 mg Start: 07/01/20 1000   Antimicrobials:  None Fluid: None Consultants: None Family Communication:  None at bedside  Status is: Inpatient  Remains inpatient appropriate because: Needs IV diuresis and monitoring.  Dispo: The patient is from: Home              Anticipated d/c is to: Home              Anticipated d/c date is: 2 to 3 days               Patient currently is not medically stable to d/c.       Infusions:  . sodium chloride    . magnesium sulfate bolus IVPB      Scheduled Meds: . aspirin EC  81 mg Oral Daily  . enoxaparin (LOVENOX) injection  40 mg Subcutaneous Daily  . furosemide  40 mg Intravenous BID  . metoprolol succinate  50 mg Oral Daily  . sacubitril-valsartan  1 tablet Oral BID  . sodium chloride flush  3 mL Intravenous Q12H    Antimicrobials: Anti-infectives (From admission, onward)   None      PRN meds: sodium chloride, acetaminophen, ipratropium-albuterol, ondansetron (ZOFRAN) IV, sodium chloride flush   Objective: Vitals:   07/01/20 0730 07/01/20 0745  BP: 127/86 127/86  Pulse: 70 77  Resp: (!) 22 (!) 25  Temp:    SpO2: 93% 95%   No intake or output data in the 24 hours ending 07/01/20 0835 There were no vitals filed for this visit. Weight change:  There is no height or weight on file to calculate BMI.   Physical Exam: General exam: Appears calm and comfortable.  Not in physical distress Skin: No rashes, lesions or ulcers. HEENT: Atraumatic, normocephalic, supple neck, no obvious bleeding Lungs: Minimal bibasilar crackles, not using accessory muscles CVS: Regular rate and rhythm, no murmur GI/Abd soft, nontender, nondistended, bowel sound present CNS: Alert, awake, oriented x3 Psychiatry: Mood appropriate Extremities: No pedal edema, no calf tenderness  Data Review: I have personally reviewed the laboratory data and studies available.  Recent Labs  Lab 06/30/20 2342 07/01/20 0524  WBC 6.9 5.7  NEUTROABS 3.4  --   HGB 13.8 14.4  HCT 41.9 43.3  MCV 101.7* 97.3  PLT 104* 96*   Recent Labs  Lab 06/30/20 2342 07/01/20 0524  NA 138 136  K 3.6 4.0  CL 107 105  CO2 21* 25  GLUCOSE 169* 118*  BUN 17 17  CREATININE 1.48* 1.42*  CALCIUM 8.4* 9.1  MG  --  1.5*    F/u labs ordered  Signed, Terrilee Croak, MD [facility] 07/01/2020

## 2020-07-01 NOTE — Progress Notes (Signed)
ReDS Clip Diuretic Study Pt study # [id number]  Your patient is in the Blinded arm of the ReDS Clip Diuretic study.  Your patient has had a ReDS reading and the reading has been transmitted to the cloud.     Thank You   The Research Team   Antonietta Jewel, PharmD, [place] Clinical Pharmacist  Phone: [phone] 07/01/2020 11:01 AM  Please check AMION for all [place] phone numbers After 10:00 PM, call [place] [phone]

## 2020-07-01 NOTE — ED Notes (Signed)
Pt aao4, gcs20, resting comfortably on bipap machine, vss on monitor, nadn. Pt reports relief of sob with bipap machine, spo2 96%. bipap settings I/e 10/4, rr10 40% o2

## 2020-07-01 NOTE — ED Notes (Signed)
md at bedside

## 2020-07-01 NOTE — Progress Notes (Signed)
Rt went to see pt to assess if bipap is needed at this time.  Pt states he feels ok unless he sits straight up or moves too much.  Pt on 3LPM [place] and sat of 97%, no accessory muscle usage and BS diminshed/clear, RR 18.  RT explained we can place bipap at anytime if he feels it is necessary.  RT will reassess pt before bedtime.

## 2020-07-01 NOTE — ED Notes (Signed)
RN called RT to evaluate for Bipap

## 2020-07-01 NOTE — H&P (Signed)
History and Physical    [name] UEA:[id number] DOB: 1946/03/27 DOA: 06/30/2020  PCP: Linus Mako, NP   Patient coming from: Home   Chief Complaint: SOB   HPI: [name] is a 74 y.o. male with medical history significant for chronic kidney disease stage IIIa, hypertension, and dilated cardiomyopathy with EF 20 to 25%, now presenting to the emergency department for evaluation of shortness of breath.  Patient reports progressive dyspnea over the past couple days that worsened significantly overnight.  He denies any associated fevers, chest pain, or leg swelling or tenderness, but has had orthopnea.  Patient reports that he is physically active, but was becoming significantly dyspneic while raking leaves which was unusual for him.  Over the past day, he has been short of breath even at rest.  There has not been any chest pain, nausea, or diaphoresis associated with this.  ED Course: Upon arrival to the ED, patient is found to be afebrile, saturating 75% on room air, tachypneic in the low 30s, and with stable blood pressure.  EKG features sinus rhythm, PVCs, and RBBB.  Chest x-ray is notable for cardiomegaly with pulmonary edema and bilateral effusions.  Chemistry panel features a creatinine of 1.48, similar to priors.  There is a new thrombocytopenia on CBC with platelets 104,000.  D-dimer is elevated to 10.31.  COVID-19 screening test is negative.  High-sensitivity troponin was 56.  Patient was started on BiPAP and treated with 60 mg IV Lasix in the ED.  CTA chest and second troponin level are pending.  Review of Systems:  All other systems reviewed and apart from HPI, are negative.  Past Medical History:  Diagnosis Date  . ABDOMINAL AORTIC ANEURYSM   . CAD    Non obstructive (mild) cath 2007  . CHF    EF 15% in 2007, 25% currently  . GLAUCOMA   . HYPERTENSION   . TOBACCO ABUSE    Previous    Past Surgical History:  Procedure Laterality Date  . Clavical Repair    .  CORNEAL TRANSPLANT     Right eye x 2  . EYE SURGERY     Glaucoma    Social History:   reports that he quit smoking about 12 years ago. He has never used smokeless tobacco. He reports that he does not drink alcohol and does not use drugs.  No Known Allergies  History reviewed. No pertinent family history.   Prior to Admission medications   Medication Sig Start Date End Date Taking? Authorizing Provider  aspirin 81 MG tablet Take 81 mg by mouth daily.   Yes [provider]  cholecalciferol (VITAMIN D) 1000 UNITS tablet Take 1,000 Units by mouth 2 (two) times daily.    Yes [provider]  furosemide (LASIX) 20 MG tablet Take 20 mg by mouth.   Yes [provider]  metoprolol succinate (TOPROL-XL) 100 MG 24 hr tablet Take 50 mg by mouth daily. Take with or immediately following a meal.   Yes [provider]  sacubitril-valsartan (ENTRESTO) 97-103 MG Take 1 tablet by mouth 2 (two) times daily. 05/19/18  Yes Minus Breeding, MD    Physical Exam: Vitals:   06/30/20 2338 06/30/20 2341 06/30/20 2353 07/01/20 0126  BP:  (!) 141/104 (!) 153/100 126/90  Pulse:  96 86 70  Resp:  (!) 33 (!) 28 10  Temp:  (!) 97.4 F (36.3 C)    TempSrc:  Oral    SpO2: (!) 75% 94% 96% 97%  Constitutional: NAD, calm  ENMT: Mucous membranes are moist. Posterior pharynx clear of any exudate or lesions.   Neck: normal, supple, no masses, no thyromegaly Respiratory: Tachypnea, speaking full sentences, no wheezing. No pallor or cyanosis.  Cardiovascular: S1 & S2 heard, regular rate and rhythm. No extremity edema.  Abdomen: No distension, no tenderness, soft. Bowel sounds active.  Musculoskeletal: no clubbing / cyanosis. No joint deformity upper and lower extremities.   Skin: no significant rashes, lesions, ulcers. Warm, dry, well-perfused. Neurologic: No facial asymmetry. Sensation intact. Moving all extremities.  Psychiatric: Alert and oriented to person, place, and  situation. Very pleasant and cooperative.    Labs and Imaging on Admission: I have personally reviewed following labs and imaging studies  CBC: Recent Labs  Lab 06/30/20 2342  WBC 6.9  NEUTROABS 3.4  HGB 13.8  HCT 41.9  MCV 101.7*  PLT 664*   Basic Metabolic Panel: Recent Labs  Lab 06/30/20 2342  NA 138  K 3.6  CL 107  CO2 21*  GLUCOSE 169*  BUN 17  CREATININE 1.48*  CALCIUM 8.4*   GFR: CrCl cannot be calculated (Unknown ideal weight.). Liver Function Tests: No results for input(s): AST, ALT, ALKPHOS, BILITOT, PROT, ALBUMIN in the last 168 hours. No results for input(s): LIPASE, AMYLASE in the last 168 hours. No results for input(s): AMMONIA in the last 168 hours. Coagulation Profile: No results for input(s): INR, PROTIME in the last 168 hours. Cardiac Enzymes: No results for input(s): CKTOTAL, CKMB, CKMBINDEX, TROPONINI in the last 168 hours. BNP (last 3 results) No results for input(s): PROBNP in the last 8760 hours. HbA1C: No results for input(s): HGBA1C in the last 72 hours. CBG: No results for input(s): GLUCAP in the last 168 hours. Lipid Profile: No results for input(s): CHOL, HDL, LDLCALC, TRIG, CHOLHDL, LDLDIRECT in the last 72 hours. Thyroid Function Tests: No results for input(s): TSH, T4TOTAL, FREET4, T3FREE, THYROIDAB in the last 72 hours. Anemia Panel: No results for input(s): VITAMINB12, FOLATE, FERRITIN, TIBC, IRON, RETICCTPCT in the last 72 hours. Urine analysis: No results found for: COLORURINE, APPEARANCEUR, LABSPEC, PHURINE, GLUCOSEU, HGBUR, BILIRUBINUR, KETONESUR, PROTEINUR, UROBILINOGEN, NITRITE, LEUKOCYTESUR Sepsis Labs: @LABRCNTIP (procalcitonin:4,lacticidven:4) ) Recent Results (from the past 240 hour(s))  Respiratory Panel by RT PCR (Flu A&B, Covid) - Nasopharyngeal Swab     Status: None   Collection Time: 06/30/20 11:44 PM   Specimen: Nasopharyngeal Swab  Result Value Ref Range Status   SARS Coronavirus 2 by RT PCR NEGATIVE  NEGATIVE Final    Comment: (NOTE) SARS-CoV-2 target nucleic acids are NOT DETECTED.  The SARS-CoV-2 RNA is generally detectable in upper respiratoy specimens during the acute phase of infection. The lowest concentration of SARS-CoV-2 viral copies this assay can detect is 131 copies/mL. A negative result does not preclude SARS-Cov-2 infection and should not be used as the sole basis for treatment or other patient management decisions. A negative result may occur with  improper specimen collection/handling, submission of specimen other than nasopharyngeal swab, presence of viral mutation(s) within the areas targeted by this assay, and inadequate number of viral copies (<131 copies/mL). A negative result must be combined with clinical observations, patient history, and epidemiological information. The expected result is Negative.  Fact Sheet for Patients:  PinkCheek.be  Fact Sheet for Healthcare Providers:  GravelBags.it  This test is no t yet approved or cleared by the [place] FDA and  has been authorized for detection and/or diagnosis of SARS-CoV-2 by FDA under an Emergency Use Authorization (EUA). This EUA  will remain  in effect (meaning this test can be used) for the duration of the COVID-19 declaration under Section 564(b)(1) of the Act, 21 U.S.C. section 360bbb-3(b)(1), unless the authorization is terminated or revoked sooner.     Influenza A by PCR NEGATIVE NEGATIVE Final   Influenza B by PCR NEGATIVE NEGATIVE Final    Comment: (NOTE) The Xpert Xpress SARS-CoV-2/FLU/RSV assay is intended as an aid in  the diagnosis of influenza from Nasopharyngeal swab specimens and  should not be used as a sole basis for treatment. Nasal washings and  aspirates are unacceptable for Xpert Xpress SARS-CoV-2/FLU/RSV  testing.  Fact Sheet for Patients: PinkCheek.be  Fact Sheet for Healthcare  Providers: GravelBags.it  This test is not yet approved or cleared by the [place] FDA and  has been authorized for detection and/or diagnosis of SARS-CoV-2 by  FDA under an Emergency Use Authorization (EUA). This EUA will remain  in effect (meaning this test can be used) for the duration of the  Covid-19 declaration under Section 564(b)(1) of the Act, 21  U.S.C. section 360bbb-3(b)(1), unless the authorization is  terminated or revoked. Performed at [facility], [place] [address], [place],  [address]      Radiological Exams on Admission: DG Chest Portable 1 View  Result Date: 07/01/2020 CLINICAL DATA:  Shortness of breath, runs of ventricular tachycardia, first-degree block EXAM: PORTABLE CHEST 1 VIEW COMPARISON:  CT 05/24/2006, radiograph 04/09/2006 FINDINGS: Diffuse hazy opacities in both lungs with some marked central vascular congestion indistinct pulmonary vascularity. Peripheral septal lines and fissural thickening are noted as well. Trace bilateral effusions. No pneumothorax. Cardiac silhouette is likely enlarged accounting for portable technique with a calcified aorta. Mild body wall edema. No other acute osseous or soft tissue abnormality. Telemetry leads overlie the chest. Remote left clavicular resection. IMPRESSION: Features most compatible with CHF/volume overload with cardiomegaly, edema and bilateral effusions. Electronically Signed   By: Lovena Le M.D.   On: 07/01/2020 00:30    EKG: Independently reviewed. Sinus rhythm, PVCs, RBBB, similar to prior.   Assessment/Plan   1. Acute on chronic combined systolic & diastolic CHF; acute hypoxic respiratory failure  - Presents with a few days of worsening SOB and orthopnea and found to have O2 saturation in 70s on rm air, edema and pleural effusions on CXR, and BNP 3200  - EF was 20-25% in 2019 with diffuse hypokinesis, grade II diastolic dysfunction, mild MR, and mild LAE  - He was  given Lasix 60 mg IV in ED and started on BiPAP  - Continue diuresis with Lasix 40 mg IV q12h, continue beta-blocker and Entresto, update echo, monitor weight and I/Os    2. Elevated d-dimer  - D-dimer elevated to 10.31 on admission  - No chest pain or hemoptysis and no leg swelling or tenderness  - CTA chest is ordered, with his dilated CM ventricular thrombus is another consideration  - Follow-up CTA and echo results   3. Thrombocytopenia  - Platelets 104k on admission, previously normal  - No infectious s/s, no bleeding   - He has new macrocytosis and nutrient deficiency potential etiology  - Repeat CBC in am, check folate, B12, and copper levels   4. CKD IIIa  - SCr is 1.48 on admission, similar to priors  - Renally-dose medications, monitor renal function and electrolyte while diuresing    5. HTN  - Hypertensive on arrival, improving with diuresis  - Continue diuresis, Entresto, Toprol     DVT  prophylaxis: Lovenox  Code Status: Full  Family Communication: Daughter updated at bedside  Disposition Plan:  Patient is from: home  Anticipated d/c is to: TBD Anticipated d/c date is: 11/11-11/12/21 Patient currently: pending CTA chest, echocardiogram, improvement in respiratory status with diuresis  Consults called: none  Admission status: Inpatient     Vianne Bulls, MD [facility]  07/01/2020, 2:57 AM

## 2020-07-01 NOTE — Progress Notes (Signed)
VASCULAR LAB    Bilateral lower extremity has been performed.  See CV proc for preliminary results.   Fallon Howerter, RVT 07/01/2020, 10:35 AM

## 2020-07-01 NOTE — Progress Notes (Signed)
°  Echocardiogram 2D Echocardiogram has been performed.  Fidel Levy 07/01/2020, 2:30 PM

## 2020-07-02 LAB — BASIC METABOLIC PANEL
Anion gap: 10 (ref 5–15)
BUN: 16 mg/dL (ref 8–23)
CO2: 29 mmol/L (ref 22–32)
Calcium: 9.3 mg/dL (ref 8.9–10.3)
Chloride: 101 mmol/L (ref 98–111)
Creatinine, Ser: 1.51 mg/dL — ABNORMAL HIGH (ref 0.61–1.24)
GFR, Estimated: 48 mL/min — ABNORMAL LOW (ref >60)
Glucose, Bld: 93 mg/dL (ref 70–99)
Potassium: 3.6 mmol/L (ref 3.5–5.1)
Sodium: 140 mmol/L (ref 135–145)

## 2020-07-02 LAB — CBC
HCT: 41.9 % (ref 39.0–52.0)
Hemoglobin: 14.3 g/dL (ref 13.0–17.0)
MCH: 32.6 pg (ref 26.0–34.0)
MCHC: 34.1 g/dL (ref 30.0–36.0)
MCV: 95.7 fL (ref 80.0–100.0)
Platelets: 105 10*3/uL — ABNORMAL LOW (ref 150–400)
RBC: 4.38 MIL/uL (ref 4.22–5.81)
RDW: 15.2 % (ref 11.5–15.5)
WBC: 6.4 10*3/uL (ref 4.0–10.5)
nRBC: 0 % (ref 0.0–0.2)

## 2020-07-02 LAB — MAGNESIUM: Magnesium: 1.6 mg/dL — ABNORMAL LOW (ref 1.7–2.4)

## 2020-07-02 LAB — PHOSPHORUS: Phosphorus: 3.2 mg/dL (ref 2.5–4.6)

## 2020-07-02 LAB — MRSA PCR SCREENING: MRSA by PCR: NEGATIVE

## 2020-07-02 MED ORDER — INFLUENZA VAC A&B SA ADJ QUAD 0.5 ML IM PRSY: 0.5000 mL | PREFILLED_SYRINGE | INTRAMUSCULAR | Status: DC

## 2020-07-02 MED ORDER — MAGNESIUM OXIDE 400 (241.3 MG) MG PO TABS
400.0000 mg | ORAL_TABLET | Freq: Once | ORAL | Status: AC
  Administered 2020-07-02: 400 mg via ORAL
  Filled 2020-07-02: qty 1

## 2020-07-02 NOTE — Discharge Summary (Signed)
Physician Discharge Summary  Owais Pruett FBP:[id number] DOB: 05-12-1946 DOA: 06/30/2020  PCP: Linus Mako, NP  Admit date: 06/30/2020 Discharge date: 07/02/2020  Admitted From: Home Discharge disposition: Home   Code Status: Full Code  Diet Recommendation: Low-salt diet  Discharge Diagnosis:   Principal Problem:   Acute on chronic combined systolic and diastolic CHF (congestive heart failure) ([place]) Active Problems:   Essential hypertension   Acute on chronic respiratory failure with hypoxia (HCC)   Chronic kidney disease, stage 3a (HCC)   Thrombocytopenia ([place])   Positive D dimer  History of Present Illness / Brief narrative:  Robert Rorieis a 74 y.o.malewith PMH significant for HTN, CKD 3a, dilated cardiomyopathy with EF 20 to 25%.  Patient presented to the ED on 11/7 with complaint of progressively worsening shortness of breath over the last few days and acutely worse in the few hours prior to presentation.  Patient states he has CHF since 1970s, compliant to medications and has not been hospitalized for exacerbation for several years.  Follows up with cardiologist at [facility].  In the ED, patient's oxygen saturation was low at 75%. BNP elevated to 3200. Chest x-ray showed cardiomegaly and pulmonary edema with bilateral effusions. CT angio of chest ruled out pulmonary lesion and confirmed pulmonary edema. Patient was placed on BiPAP and given IV Lasix 60 mg. He was admitted under hospitalist service for acute exacerbation of congestive heart failure.  With IV Lasix, his respite status improved.  He feels ready to go home today.  Subjective:  Seen and examined this morning.  Feels better.  He was requiring a lot of oxygen this morning.  During the day, he was able to come off oxygen.  He was able to ambulate on the hallway without supplemental oxygen.  He feels comfortable going home this afternoon.  Hospital Course:  Acute on chronic combined systolic &  diastolic CHF Essential hypertension -Presented with worsening shortness of breath, orthopnea, hypoxia, pulmonary edema and chest x-ray and elevated BNP.   -Echo from 2019 with EF 20 to 82%, grade 2 diastolic dysfunction. -Repeat echo 11/8 showed the same EF of 20 to 25% with normal left ventricular diastolic parameters. -Patient was diuresed with IV Lasix.  Net negative balance of 2.5 L in 24 hours. -Home meds include Lasix 20 mg daily, metoprolol succinate 50 mg daily, Entresto 97/103 mg twice daily. -He will continue the same medicines at home.  Acute respiratory failure with hypoxia -Secondary to CHF exacerbation. -Initially required BiPAP.    Gradually weaned down to nasal cannula.  This afternoon, he is able to ambulate in the hallway without oxygen supplementation.    Hypomagnesemia -Magnesium low at 1.6 today.  Oral replacement given. Recent Labs  Lab 06/30/20 2342 07/01/20 0524 07/02/20 0550  K 3.6 4.0 3.6  MG  --  1.5* 1.6*  PHOS  --   --  3.2   CKD 3a -Creatinine stable at baseline.  Continue to monitor with diuresis. Recent Labs    06/30/20 2342 07/01/20 0524 07/02/20 0550  BUN 17 17 16   CREATININE 1.48* 1.42* 1.51*   Elevated d-dimer -D-dimer elevated to 10.31 but CTA chest negative for PE -DVT scan of lower extremities negative.  Thrombocytopenia -Platelets low but stable. -No infectious s/s, no bleeding -Continue aspirin 81 mg daily as before. Recent Labs  Lab 06/30/20 2342 07/01/20 0524 07/02/20 0550  PLT 104* 96* 105*   Bilateral pulmonary nodules -per CT chest, patient has bilateral pulmonologist measuring up to 7 mm  and not all of which are stable since 2007. Non-contrast chest CT at 3-6 months is recommended.  Stable for discharge to home today.  Wound care:    Discharge Exam:   Vitals:   07/02/20 0300 07/02/20 0713 07/02/20 1200 07/02/20 1546  BP: 122/82 122/85 140/74 99/75  Pulse: 70 71 76 72  Resp: 13 18 20 16   Temp: 98 F  (36.7 C) 97.7 F (36.5 C) (!) 97.5 F (36.4 C) 97.6 F (36.4 C)  TempSrc: Oral Oral Oral Oral  SpO2: 99% 99% 98%   Weight:      Height:        Body mass index is 19.43 kg/m.  General exam: Appears calm and comfortable.  Not in physical distress Skin: No rashes, lesions or ulcers. HEENT: Atraumatic, normocephalic, supple neck, no obvious bleeding Lungs: Clear to auscultation bilaterally CVS: Regular rate and rhythm, no murmur GI/Abd soft, nontender, nondistended, bowel sound present CNS: Alert, awake, oriented x3 Psychiatry: Mood appropriate Extremities: No pedal edema, no calf tenderness  Follow ups:   Discharge Instructions    Diet - low sodium heart healthy   Complete by: As directed    Increase activity slowly   Complete by: As directed       Follow-up Information    Raji, Sharlynn Oliphant, NP Follow up.   Specialty: Nurse Practitioner Contact information: [address] [place] [address] [phone]               Recommendations for Outpatient Follow-Up:   1. Follow-up with PCP as an outpatient 2. Follow-up with cardiology as an outpatient  Discharge Instructions:  Follow with Primary MD Raji, Sharlynn Oliphant, NP in 7 days   Get CBC as BMP checked in next visit within 1 week by PCP or SNF MD ( we routinely change or add medications that can affect your baseline labs and fluid status, therefore we recommend that you get the mentioned basic workup next visit with your PCP, your PCP may decide not to get them or add new tests based on their clinical decision)  On your next visit with your PCP, please Get Medicines reviewed and adjusted.  Please request your PCP  to go over all Hospital Tests and Procedure/Radiological results at the follow up, please get all Hospital records sent to your Prim MD by signing hospital release before you go home.  Activity: As tolerated with Full fall precautions use walker/cane & assistance as needed  For Heart failure  patients - Check your Weight same time everyday, if you gain over 2 pounds, or you develop in leg swelling, experience more shortness of breath or chest pain, call your Primary MD immediately. Follow Cardiac Low Salt Diet and 1.5 lit/day fluid restriction.  If you have smoked or chewed Tobacco in the last 2 yrs please stop smoking, stop any regular Alcohol  and or any Recreational drug use.  If you experience worsening of your admission symptoms, develop shortness of breath, life threatening emergency, suicidal or homicidal thoughts you must seek medical attention immediately by calling 911 or calling your MD immediately  if symptoms less severe.  You Must read complete instructions/literature along with all the possible adverse reactions/side effects for all the Medicines you take and that have been prescribed to you. Take any new Medicines after you have completely understood and accpet all the possible adverse reactions/side effects.   Do not drive, operate heavy machinery, perform activities at heights, swimming or participation in water activities or provide baby  sitting services if your were admitted for syncope or siezures until you have seen by Primary MD or a Neurologist and advised to do so again.  Do not drive when taking Pain medications.  Do not take more than prescribed Pain, Sleep and Anxiety Medications  Wear Seat belts while driving.   Please note You were cared for by a hospitalist during your hospital stay. If you have any questions about your discharge medications or the care you received while you were in the hospital after you are discharged, you can call the unit and asked to speak with the hospitalist on call if the hospitalist that took care of you is not available. Once you are discharged, your primary care physician will handle any further medical issues. Please note that NO REFILLS for any discharge medications will be authorized once you are discharged, as it is  imperative that you return to your primary care physician (or establish a relationship with a primary care physician if you do not have one) for your aftercare needs so that they can reassess your need for medications and monitor your lab values.    Allergies as of 07/02/2020   No Known Allergies     Medication List    TAKE these medications   aspirin 81 MG tablet Take 81 mg by mouth daily.   cholecalciferol 1000 units tablet Commonly known as: VITAMIN D Take 1,000 Units by mouth 2 (two) times daily.   furosemide 20 MG tablet Commonly known as: LASIX Take 20 mg by mouth.   metoprolol succinate 100 MG 24 hr tablet Commonly known as: TOPROL-XL Take 50 mg by mouth daily. Take with or immediately following a meal.   sacubitril-valsartan 97-103 MG Commonly known as: ENTRESTO Take 1 tablet by mouth 2 (two) times daily.       Time coordinating discharge: 35 minutes  The results of significant diagnostics from this hospitalization (including imaging, microbiology, ancillary and laboratory) are listed below for reference.    Procedures and Diagnostic Studies:   CT Angio Chest PE W and/or Wo Contrast  Result Date: 07/01/2020 CLINICAL DATA:  PE suspected. Positive D-dimer. Difficulty breathing. EXAM: CT ANGIOGRAPHY CHEST WITH CONTRAST TECHNIQUE: Multidetector CT imaging of the chest was performed using the standard protocol during bolus administration of intravenous contrast. Multiplanar CT image reconstructions and MIPs were obtained to evaluate the vascular anatomy. CONTRAST:  11mL OMNIPAQUE IOHEXOL 350 MG/ML SOLN COMPARISON:  CT dated 05/24/2006 FINDINGS: Cardiovascular: Contrast injection is sufficient to demonstrate satisfactory opacification of the pulmonary arteries to the segmental level. There is no pulmonary embolus or evidence of right heart strain. The size of the main pulmonary artery is enlarged, measuring 3 5 cm. Cardiomegaly with coronary artery calcification. The course  and caliber of the aorta are normal. There is mild atherosclerotic calcification. Opacification decreased due to pulmonary arterial phase contrast bolus timing. There is reflux of contrast into the IVC. Mediastinum/Nodes: --mild mediastinal adenopathy is noted, presumably reactive. --there is mild symmetric hilar adenopathy. -- No axillary lymphadenopathy. -- No supraclavicular lymphadenopathy. -- Normal thyroid gland where visualized. -  Unremarkable esophagus. Lungs/Pleura: There are small bilateral pleural effusions, right greater than left. Emphysematous changes are noted. There is diffuse bilateral bronchial wall thickening and mucus plugging. There are bilateral pulmonary nodules measuring up to approximately 7 mm. For example there is a 7 mm pulmonary nodule in the left upper lobe (axial series 6, image 80), not well appreciated on the patient's 2007 study. There is a 7 mm subpleural  nodule involving the right upper lobe (axial series 6, image 68), stable since 2007. There is a 4 mm pulmonary nodule in the right upper lobe (axial series 6, image 71), stable since 2007. Upper Abdomen: Contrast bolus timing is not optimized for evaluation of the abdominal organs. There is probable cholelithiasis involving the partially visualized gallbladder. Bilateral renal cysts are noted. Musculoskeletal: No chest wall abnormality. No bony spinal canal stenosis. Review of the MIP images confirms the above findings. IMPRESSION: 1. There is no evidence for acute pulmonary embolus. 2. Cardiomegaly with findings suggestive of congestive heart failure and pulmonary edema. 3. Bilateral pulmonary nodules measuring up to 7 mm, not all of which are stable since 2007. Non-contrast chest CT at 3-6 months is recommended. If the nodules are stable at time of repeat CT, then future CT at 18-24 months (from today's scan) is considered optional for low-risk patients, but is recommended for high-risk patients. This recommendation follows the  consensus statement: Guidelines for Management of Incidental Pulmonary Nodules Detected on CT Images: From the Fleischner Society 2017; Radiology 2017; 284:228-243. 4. Enlarged size of the main pulmonary artery which can be seen in patients with elevated pulmonary artery pressures. 5. Mild mediastinal and hilar adenopathy, presumably reactive. 6. Probable cholelithiasis. Aortic Atherosclerosis (ICD10-I70.0) and Emphysema (ICD10-J43.9). Electronically Signed   By: Constance Holster M.D.   On: 07/01/2020 03:50   DG Chest Portable 1 View  Result Date: 07/01/2020 CLINICAL DATA:  Shortness of breath, runs of ventricular tachycardia, first-degree block EXAM: PORTABLE CHEST 1 VIEW COMPARISON:  CT 05/24/2006, radiograph 04/09/2006 FINDINGS: Diffuse hazy opacities in both lungs with some marked central vascular congestion indistinct pulmonary vascularity. Peripheral septal lines and fissural thickening are noted as well. Trace bilateral effusions. No pneumothorax. Cardiac silhouette is likely enlarged accounting for portable technique with a calcified aorta. Mild body wall edema. No other acute osseous or soft tissue abnormality. Telemetry leads overlie the chest. Remote left clavicular resection. IMPRESSION: Features most compatible with CHF/volume overload with cardiomegaly, edema and bilateral effusions. Electronically Signed   By: Lovena Le M.D.   On: 07/01/2020 00:30   ECHOCARDIOGRAM COMPLETE  Result Date: 07/01/2020    ECHOCARDIOGRAM REPORT   Patient Name:   JAYMZ TRAYWICK Date of Exam: 07/01/2020 Medical Rec #:  [id number]     Height:       71.0 in Accession #:    [id number]    Weight:       150.0 lb Date of Birth:  05-18-1946    BSA:          1.866 m Patient Age:    74 years      BP:           127/86 mmHg Patient Gender: M             HR:           68 bpm. Exam Location:  Inpatient Procedure: 2D Echo, Cardiac Doppler, Color Doppler and Intracardiac            Opacification Agent Indications:    CHF-Acute  Systolic 270.35 / K09.38  History:        Patient has prior history of Echocardiogram examinations, most                 recent 03/02/2018. CHF, CAD; Risk Factors:Hypertension and                 Current Smoker.  Sonographer:    Bernadene Person RDCS Referring Phys: [phone]  TIMOTHY S OPYD IMPRESSIONS  1. Left ventricular ejection fraction, by estimation, is 20 to 25%. The left ventricle has severely decreased function. The left ventricle demonstrates global hypokinesis. The left ventricular internal cavity size was moderately dilated. Left ventricular diastolic parameters were normal.  2. Right ventricular systolic function is moderately reduced. The right ventricular size is moderately enlarged. There is moderately elevated pulmonary artery systolic pressure.  3. Left atrial size was mildly dilated.  4. Degree of functional MR worse compared to echo done July 2019. The mitral valve is normal in structure. Moderate mitral valve regurgitation. No evidence of mitral stenosis.  5. The aortic valve is normal in structure. Aortic valve regurgitation is mild. No aortic stenosis is present.  6. The inferior vena cava is dilated in size with <50% respiratory variability, suggesting right atrial pressure of 15 mmHg. FINDINGS  Left Ventricle: Left ventricular ejection fraction, by estimation, is 20 to 25%. The left ventricle has severely decreased function. The left ventricle demonstrates global hypokinesis. Definity contrast agent was given IV to delineate the left ventricular endocardial borders. The left ventricular internal cavity size was moderately dilated. There is no left ventricular hypertrophy. Left ventricular diastolic parameters were normal. Right Ventricle: The right ventricular size is moderately enlarged. Right vetricular wall thickness was not assessed. Right ventricular systolic function is moderately reduced. There is moderately elevated pulmonary artery systolic pressure. The tricuspid regurgitant velocity is  3.02 m/s, and with an assumed right atrial pressure of 15 mmHg, the estimated right ventricular systolic pressure is 93.8 mmHg. Left Atrium: Left atrial size was mildly dilated. Right Atrium: Right atrial size was normal in size. Pericardium: There is no evidence of pericardial effusion. Mitral Valve: Degree of functional MR worse compared to echo done July 2019. The mitral valve is normal in structure. Moderate mitral valve regurgitation. No evidence of mitral valve stenosis. Tricuspid Valve: The tricuspid valve is normal in structure. Tricuspid valve regurgitation is mild . No evidence of tricuspid stenosis. Aortic Valve: The aortic valve is normal in structure. Aortic valve regurgitation is mild. No aortic stenosis is present. Pulmonic Valve: The pulmonic valve was normal in structure. Pulmonic valve regurgitation is mild. No evidence of pulmonic stenosis. Aorta: The aortic root is normal in size and structure. Venous: The inferior vena cava is dilated in size with less than 50% respiratory variability, suggesting right atrial pressure of 15 mmHg. IAS/Shunts: No atrial level shunt detected by color flow Doppler.  LEFT VENTRICLE PLAX 2D LVIDd:         5.90 cm      Diastology LVIDs:         5.20 cm      LV e' medial:    4.26 cm/s LV PW:         1.00 cm      LV E/e' medial:  16.2 LV IVS:        0.80 cm      LV e' lateral:   6.18 cm/s LVOT diam:     2.30 cm      LV E/e' lateral: 11.2 LV SV:         40 LV SV Index:   22 LVOT Area:     4.15 cm  LV Volumes (MOD) LV vol d, MOD A2C: 155.0 ml LV vol d, MOD A4C: 186.0 ml LV vol s, MOD A2C: 90.5 ml LV vol s, MOD A4C: 128.0 ml LV SV MOD A2C:     64.5 ml LV SV MOD A4C:  186.0 ml LV SV MOD BP:      60.9 ml RIGHT VENTRICLE RV S prime:     8.67 cm/s TAPSE (M-mode): 1.5 cm LEFT ATRIUM             Index       RIGHT ATRIUM           Index LA diam:        4.10 cm 2.20 cm/m  RA Area:     20.80 cm LA Vol (A2C):   64.2 ml 34.41 ml/m RA Volume:   57.10 ml  30.60 ml/m LA Vol  (A4C):   85.6 ml 45.88 ml/m LA Biplane Vol: 77.7 ml 41.64 ml/m  AORTIC VALVE LVOT Vmax:   69.63 cm/s LVOT Vmean:  44.500 cm/s LVOT VTI:    0.097 m  AORTA Ao Root diam: 3.50 cm Ao Asc diam:  3.10 cm MITRAL VALVE                 TRICUSPID VALVE MV Area (PHT): 2.32 cm      TR Peak grad:   36.5 mmHg MV Decel Time: 327 msec      TR Vmax:        302.00 cm/s MR Peak grad:    69.2 mmHg MR Mean grad:    40.0 mmHg   SHUNTS MR Vmax:         416.00 cm/s Systemic VTI:  0.10 m MR Vmean:        296.0 cm/s  Systemic Diam: 2.30 cm MR PISA:         1.57 cm MR PISA Eff ROA: 13 mm MR PISA Radius:  0.50 cm MV E velocity: 69.10 cm/s MV A velocity: 30.30 cm/s MV E/A ratio:  2.28 Jenkins Rouge MD Electronically signed by Jenkins Rouge MD Signature Date/Time: 07/01/2020/2:40:28 PM    Final    VAS [place] LOWER EXTREMITY VENOUS (DVT)  Result Date: 07/01/2020  Lower Venous DVT Study Indications: SOB, and elevated D-Dimer.  Risk Factors: No PE by CTA. Limitations: Shadowing from arterial plaque. Comparison Study: No prior study Performing Technologist: Sharion Dove RVS  Examination Guidelines: A complete evaluation includes B-mode imaging, spectral Doppler, color Doppler, and power Doppler as needed of all accessible portions of each vessel. Bilateral testing is considered an integral part of a complete examination. Limited examinations for reoccurring indications may be performed as noted. The reflux portion of the exam is performed with the patient in reverse Trendelenburg.  +---------+---------------+---------+-----------+----------+--------------+ RIGHT    CompressibilityPhasicitySpontaneityPropertiesThrombus Aging +---------+---------------+---------+-----------+----------+--------------+ CFV      Full           Yes      Yes                                 +---------+---------------+---------+-----------+----------+--------------+ SFJ      Full                                                         +---------+---------------+---------+-----------+----------+--------------+ FV Prox  Full                                                        +---------+---------------+---------+-----------+----------+--------------+  FV Mid   Full                                                        +---------+---------------+---------+-----------+----------+--------------+ FV DistalFull                                                        +---------+---------------+---------+-----------+----------+--------------+ PFV      Full                                                        +---------+---------------+---------+-----------+----------+--------------+ POP      Full           Yes      Yes                                 +---------+---------------+---------+-----------+----------+--------------+ PTV      Full                                                        +---------+---------------+---------+-----------+----------+--------------+ PERO     Full                                                        +---------+---------------+---------+-----------+----------+--------------+   +---------+---------------+---------+-----------+----------+--------------+ LEFT     CompressibilityPhasicitySpontaneityPropertiesThrombus Aging +---------+---------------+---------+-----------+----------+--------------+ CFV      Full           Yes      Yes                                 +---------+---------------+---------+-----------+----------+--------------+ SFJ      Full                                                        +---------+---------------+---------+-----------+----------+--------------+ FV Prox  Full                                                        +---------+---------------+---------+-----------+----------+--------------+ FV Mid   Full                                                         +---------+---------------+---------+-----------+----------+--------------+  FV DistalFull                                                        +---------+---------------+---------+-----------+----------+--------------+ PFV      Full                                                        +---------+---------------+---------+-----------+----------+--------------+ POP      Full           Yes      Yes                                 +---------+---------------+---------+-----------+----------+--------------+ PTV      Full                                                        +---------+---------------+---------+-----------+----------+--------------+ PERO     Full                                                        +---------+---------------+---------+-----------+----------+--------------+     Summary: BILATERAL: - No evidence of deep vein thrombosis seen in the lower extremities, bilaterally. -   *See table(s) above for measurements and observations. Electronically signed by Monica Martinez MD on 07/01/2020 at 3:25:33 PM.    Final      Labs:   Basic Metabolic Panel: Recent Labs  Lab 06/30/20 2342 06/30/20 2342 07/01/20 0524 07/02/20 0550  NA 138  --  136 140  K 3.6   < > 4.0 3.6  CL 107  --  105 101  CO2 21*  --  25 29  GLUCOSE 169*  --  118* 93  BUN 17  --  17 16  CREATININE 1.48*  --  1.42* 1.51*  CALCIUM 8.4*  --  9.1 9.3  MG  --   --  1.5* 1.6*  PHOS  --   --   --  3.2   < > = values in this interval not displayed.   GFR Estimated Creatinine Clearance: 38.4 mL/min (A) (by C-G formula based on SCr of 1.51 mg/dL (H)). Liver Function Tests: No results for input(s): AST, ALT, ALKPHOS, BILITOT, PROT, ALBUMIN in the last 168 hours. No results for input(s): LIPASE, AMYLASE in the last 168 hours. No results for input(s): AMMONIA in the last 168 hours. Coagulation profile No results for input(s): INR, PROTIME in the last 168 hours.  CBC: Recent Labs    Lab 06/30/20 2342 07/01/20 0524 07/02/20 0550  WBC 6.9 5.7 6.4  NEUTROABS 3.4  --   --   HGB 13.8 14.4 14.3  HCT 41.9 43.3 41.9  MCV 101.7* 97.3 95.7  PLT 104* 96* 105*   Cardiac Enzymes: No results for input(s): CKTOTAL, CKMB, CKMBINDEX, TROPONINI in  the last 168 hours. BNP: Invalid input(s): POCBNP CBG: No results for input(s): GLUCAP in the last 168 hours. D-Dimer Recent Labs    06/30/20 2342  DDIMER 10.31*   Hgb A1c No results for input(s): HGBA1C in the last 72 hours. Lipid Profile No results for input(s): CHOL, HDL, LDLCALC, TRIG, CHOLHDL, LDLDIRECT in the last 72 hours. Thyroid function studies No results for input(s): TSH, T4TOTAL, T3FREE, THYROIDAB in the last 72 hours.  Invalid input(s): FREET3 Anemia work up Recent Labs    07/01/20 0524  VITAMINB12 586  FOLATE 12.3   Microbiology Recent Results (from the past 240 hour(s))  Respiratory Panel by RT PCR (Flu A&B, Covid) - Nasopharyngeal Swab     Status: None   Collection Time: 06/30/20 11:44 PM   Specimen: Nasopharyngeal Swab  Result Value Ref Range Status   SARS Coronavirus 2 by RT PCR NEGATIVE NEGATIVE Final    Comment: (NOTE) SARS-CoV-2 target nucleic acids are NOT DETECTED.  The SARS-CoV-2 RNA is generally detectable in upper respiratoy specimens during the acute phase of infection. The lowest concentration of SARS-CoV-2 viral copies this assay can detect is 131 copies/mL. A negative result does not preclude SARS-Cov-2 infection and should not be used as the sole basis for treatment or other patient management decisions. A negative result may occur with  improper specimen collection/handling, submission of specimen other than nasopharyngeal swab, presence of viral mutation(s) within the areas targeted by this assay, and inadequate number of viral copies (<131 copies/mL). A negative result must be combined with clinical observations, patient history, and epidemiological information. The expected  result is Negative.  Fact Sheet for Patients:  PinkCheek.be  Fact Sheet for Healthcare Providers:  GravelBags.it  This test is no t yet approved or cleared by the [place] FDA and  has been authorized for detection and/or diagnosis of SARS-CoV-2 by FDA under an Emergency Use Authorization (EUA). This EUA will remain  in effect (meaning this test can be used) for the duration of the COVID-19 declaration under Section 564(b)(1) of the Act, 21 U.S.C. section 360bbb-3(b)(1), unless the authorization is terminated or revoked sooner.     Influenza A by PCR NEGATIVE NEGATIVE Final   Influenza B by PCR NEGATIVE NEGATIVE Final    Comment: (NOTE) The Xpert Xpress SARS-CoV-2/FLU/RSV assay is intended as an aid in  the diagnosis of influenza from Nasopharyngeal swab specimens and  should not be used as a sole basis for treatment. Nasal washings and  aspirates are unacceptable for Xpert Xpress SARS-CoV-2/FLU/RSV  testing.  Fact Sheet for Patients: PinkCheek.be  Fact Sheet for Healthcare Providers: GravelBags.it  This test is not yet approved or cleared by the [place] FDA and  has been authorized for detection and/or diagnosis of SARS-CoV-2 by  FDA under an Emergency Use Authorization (EUA). This EUA will remain  in effect (meaning this test can be used) for the duration of the  Covid-19 declaration under Section 564(b)(1) of the Act, 21  U.S.C. section 360bbb-3(b)(1), unless the authorization is  terminated or revoked. Performed at [facility], [place] [address] [place], [place] [address]   MRSA PCR Screening     Status: None   Collection Time: 07/02/20  2:01 AM   Specimen: Nasal Mucosa; Nasopharyngeal  Result Value Ref Range Status   MRSA by PCR NEGATIVE NEGATIVE Final    Comment:        The GeneXpert MRSA Assay (FDA approved for NASAL  specimens only), is one component of a  comprehensive MRSA colonization surveillance program. It is not intended to diagnose MRSA infection nor to guide or monitor treatment for MRSA infections. Performed at [facility], [place] [address] [place], [place] [address]      Signed: Terrilee Croak  [facility] 07/02/2020, 4:45 PM

## 2020-07-02 NOTE — TOC Transition Note (Signed)
Transition of Care [facility]) - CM/SW Discharge Note   Patient Details  Name: [name] MRN: [id number] Date of Birth: 1945-10-21  Transition of Care [facility]) CM/SW Contact:  Zenon Mayo, RN Phone Number: 07/02/2020, 4:54 PM   Clinical Narrative:    Patient is for dc today, NCM spoke with patient, he states he needs no HH services.  He has no other needs.    Final next level of care: Home/Self Care Barriers to Discharge: No Barriers Identified   Patient Goals and CMS Choice        Discharge Placement                       Discharge Plan and Services                                     Social Determinants of Health (SDOH) Interventions     Readmission Risk Interventions No flowsheet data found.

## 2020-07-02 NOTE — Progress Notes (Signed)
Heart Failure Stewardship Pharmacist Progress Note   PCP: Linus Mako, NP PCP-Cardiologist: No primary care provider on file.    HPI:  74 yo M with PMH of HTN, CKD, and CHF. He presented to the ED on 06/30/20 with progressively worsening shortness of breath. An ECHO was done and EF remains reduced at 20-25% (unchanged from last ECHO in July 2019).   Current HF Medications: Furosemide 40 mg IV BID Metoprolol XL 50 mg daily Entresto 97/103 mg BID  Prior to admission HF Medications: Furosemide 20 mg daily Metoprolol XL 50 mg daily Entresto 97/103 mg BID  Pertinent Lab Values:  Serum creatinine 1.51, BUN 16, Potassium 3.6, Sodium 140, BNP 3205.6, Magnesium 1.6   Vital Signs:  Weight: 137 lbs (admission weight: 150 lbs)  Blood pressure: 120-140/70s   Heart rate: 70s   ReDS reading: control reading in ReDS study  Outpatient Pharmacy:  Prior to admission outpatient pharmacy: [facility] Is the patient willing to use [place] pharmacy at discharge? Yes Is the patient willing to transition their outpatient pharmacy to utilize a [facility] outpatient pharmacy?   No - fills prescriptions for free through [facility]    Assessment: 1. Acute on chronic systolic CHF (EF 17-49%), due to NICM. NYHA class II symptoms. - Continue furosemide 40 mg IV BID - excellent urine output (net -3.5L) - Continue metoprolol XL 50 mg daily - Continue Entresto 97/103 mg BID - Consider adding Jardiance 10 mg daily (preferred SGLT2i on [place] formulary) for further HF medication optimization - Consider adding spironolactone prior to discharge for additional diuretic effect and HF benefits vs furosemide alone   Plan: 1) Medication changes recommended at this time: - Add Jardiance 10 mg daily  2)  Education  - To be completed prior to discharge  Kerby Nora, PharmD, BCPS Heart Failure Cytogeneticist Phone [phone]

## 2020-07-02 NOTE — Discharge Instructions (Signed)
Heart Failure, Self Care Heart failure is a serious condition. This sheet explains things you need to do to take care of yourself at home. To help you stay as healthy as possible, you may be asked to change your diet, take certain medicines, and make other changes in your life. Your doctor may also give you more specific instructions. If you have problems or questions, call your doctor. What are the risks? Having heart failure makes it more likely for you to have some problems. These problems can get worse if you do not take good care of yourself. Problems may include:  Blood clotting problems. This may cause a stroke.  Damage to the kidneys, liver, or lungs.  Abnormal heart rhythms. Supplies needed:  Scale for weighing yourself.  Blood pressure monitor.  Notebook.  Medicines. How to care for yourself when you have heart failure Medicines Take over-the-counter and prescription medicines only as told by your doctor. Take your medicines every day.  Do not stop taking your medicine unless your doctor tells you to do so.  Do not skip any medicines.  Get your prescriptions refilled before you run out of medicine. This is important. Eating and drinking   Eat heart-healthy foods. Talk with a diet specialist (dietitian) to create an eating plan.  Choose foods that: ? Have no trans fat. ? Are low in saturated fat and cholesterol.  Choose healthy foods, such as: ? Fresh or frozen fruits and vegetables. ? Fish. ? Low-fat (lean) meats. ? Legumes, such as beans, peas, and lentils. ? Fat-free or low-fat dairy products. ? Whole-grain foods. ? High-fiber foods.  Limit salt (sodium) if told by your doctor. Ask your diet specialist to tell you which seasonings are healthy for your heart.  Cook in healthy ways instead of frying. Healthy ways of cooking include roasting, grilling, broiling, baking, poaching, steaming, and stir-frying.  Limit how much fluid you drink, if told by your  doctor. Alcohol use  Do not drink alcohol if: ? Your doctor tells you not to drink. ? Your heart was damaged by alcohol, or you have very bad heart failure. ? You are pregnant, may be pregnant, or are planning to become pregnant.  If you drink alcohol: ? Limit how much you use to:  0-1 drink a day for women.  0-2 drinks a day for men. ? Be aware of how much alcohol is in your drink. In the [place] one drink equals one 12 oz bottle of beer (355 mL), one 5 oz glass of wine (148 mL), or one 1 oz glass of hard liquor (44 mL). Lifestyle   Do not use any products that contain nicotine or tobacco, such as cigarettes, e-cigarettes, and chewing tobacco. If you need help quitting, ask your doctor. ? Do not use nicotine gum or patches before talking to your doctor.  Do not use illegal drugs.  Lose weight if told by your doctor.  Do physical activity if told by your doctor. Talk to your doctor before you begin an exercise if: ? You are an older adult. ? You have very bad heart failure.  Learn to manage stress. If you need help, ask your doctor.  Get rehab (rehabilitation) to help you stay independent and to help with your quality of life.  Plan time to rest when you get tired. Check weight and blood pressure   Weigh yourself every day. This will help you to know if fluid is building up in your body. ? Weigh yourself every morning   after you pee (urinate) and before you eat breakfast. ? Wear the same amount of clothing each time. ? Write down your daily weight. Give your record to your doctor.  Check and write down your blood pressure as told by your doctor.  Check your pulse as told by your doctor. Dealing with very hot and very cold weather  If it is very hot: ? Avoid activities that take a lot of energy. ? Use air conditioning or fans, or find a cooler place. ? Avoid caffeine and alcohol. ? Wear clothing that is loose-fitting, lightweight, and light-colored.  If it is very  cold: ? Avoid activities that take a lot of energy. ? Layer your clothes. ? Wear mittens or gloves, a hat, and a scarf when you go outside. ? Avoid alcohol. Follow these instructions at home:  Stay up to date with shots (vaccines). Get pneumococcal and flu (influenza) shots.  Keep all follow-up visits as told by your doctor. This is important. Contact a doctor if:  You gain weight quickly.  You have increasing shortness of breath.  You cannot do your normal activities.  You get tired easily.  You cough a lot.  You don't feel like eating or feel like you may vomit (nauseous).  You become puffy (swell) in your hands, feet, ankles, or belly (abdomen).  You cannot sleep well because it is hard to breathe.  You feel like your heart is beating fast (palpitations).  You get dizzy when you stand up. Get help right away if:  You have trouble breathing.  You or someone else notices a change in your behavior, such as having trouble staying awake.  You have chest pain or discomfort.  You pass out (faint). These symptoms may be an emergency. Do not wait to see if the symptoms will go away. Get medical help right away. Call your local emergency services (911 in the [place] Do not drive yourself to the hospital. Summary  Heart failure is a serious condition. To care for yourself, you may have to change your diet, take medicines, and make other lifestyle changes.  Take your medicines every day. Do not stop taking them unless your doctor tells you to do so.  Eat heart-healthy foods, such as fresh or frozen fruits and vegetables, fish, lean meats, legumes, fat-free or low-fat dairy products, and whole-grain or high-fiber foods.  Ask your doctor if you can drink alcohol. You may have to stop alcohol use if you have very bad heart failure.  Contact your doctor if you gain weight quickly or feel that your heart is beating too fast. Get help right away if you pass out, or have chest pain  or trouble breathing. This information is not intended to replace advice given to you by your health care provider. Make sure you discuss any questions you have with your health care provider. Document Revised: 11/22/2018 Document Reviewed: 11/23/2018 Elsevier Patient Education  2020 [employer]  

## 2020-07-03 LAB — COPPER, SERUM: Copper: 179 ug/dL — ABNORMAL HIGH (ref 69–132)

## 2020-07-05 ENCOUNTER — Telehealth: Payer: Self-pay | Admitting: Cardiology

## 2020-07-05 NOTE — Telephone Encounter (Signed)
Patient would like to inform Dr. Percival Spanish that he was released from the hospital recently, because she has fluid in his lungs. He would like Dr. Percival Spanish to look at the test results done in the hospital.

## 2020-08-06 ENCOUNTER — Emergency Department (HOSPITAL_COMMUNITY): Payer: Medicare Other

## 2020-08-06 ENCOUNTER — Inpatient Hospital Stay (HOSPITAL_COMMUNITY)
Admission: EM | Admit: 2020-08-06 | Discharge: 2020-08-10 | DRG: 286 | Disposition: A | Discharge status: Home / Self Care | Payer: Medicare Other | Attending: Internal Medicine | Admitting: Internal Medicine

## 2020-08-06 ENCOUNTER — Other Ambulatory Visit: Payer: Self-pay

## 2020-08-06 ENCOUNTER — Encounter (HOSPITAL_COMMUNITY): Payer: Self-pay | Admitting: Emergency Medicine

## 2020-08-06 DIAGNOSIS — E7800 Pure hypercholesterolemia, unspecified: Secondary | ICD-10-CM | POA: Diagnosis present

## 2020-08-06 DIAGNOSIS — D696 Thrombocytopenia, unspecified: Secondary | ICD-10-CM | POA: Diagnosis present

## 2020-08-06 DIAGNOSIS — I5082 Biventricular heart failure: Secondary | ICD-10-CM | POA: Diagnosis present

## 2020-08-06 DIAGNOSIS — I5023 Acute on chronic systolic (congestive) heart failure: Secondary | ICD-10-CM | POA: Diagnosis not present

## 2020-08-06 DIAGNOSIS — I4729 Other ventricular tachycardia: Secondary | ICD-10-CM

## 2020-08-06 DIAGNOSIS — I5043 Acute on chronic combined systolic (congestive) and diastolic (congestive) heart failure: Secondary | ICD-10-CM | POA: Diagnosis not present

## 2020-08-06 DIAGNOSIS — Z947 Corneal transplant status: Secondary | ICD-10-CM

## 2020-08-06 DIAGNOSIS — J9601 Acute respiratory failure with hypoxia: Secondary | ICD-10-CM | POA: Diagnosis present

## 2020-08-06 DIAGNOSIS — R739 Hyperglycemia, unspecified: Secondary | ICD-10-CM | POA: Diagnosis present

## 2020-08-06 DIAGNOSIS — Z7982 Long term (current) use of aspirin: Secondary | ICD-10-CM

## 2020-08-06 DIAGNOSIS — R7303 Prediabetes: Secondary | ICD-10-CM | POA: Diagnosis present

## 2020-08-06 DIAGNOSIS — I739 Peripheral vascular disease, unspecified: Secondary | ICD-10-CM | POA: Diagnosis present

## 2020-08-06 DIAGNOSIS — Z20822 Contact with and (suspected) exposure to covid-19: Secondary | ICD-10-CM | POA: Diagnosis present

## 2020-08-06 DIAGNOSIS — R918 Other nonspecific abnormal finding of lung field: Secondary | ICD-10-CM | POA: Diagnosis present

## 2020-08-06 DIAGNOSIS — R778 Other specified abnormalities of plasma proteins: Secondary | ICD-10-CM

## 2020-08-06 DIAGNOSIS — I472 Ventricular tachycardia: Secondary | ICD-10-CM | POA: Diagnosis not present

## 2020-08-06 DIAGNOSIS — F17290 Nicotine dependence, other tobacco product, uncomplicated: Secondary | ICD-10-CM | POA: Diagnosis present

## 2020-08-06 DIAGNOSIS — I2510 Atherosclerotic heart disease of native coronary artery without angina pectoris: Secondary | ICD-10-CM | POA: Diagnosis present

## 2020-08-06 DIAGNOSIS — H409 Unspecified glaucoma: Secondary | ICD-10-CM | POA: Diagnosis present

## 2020-08-06 DIAGNOSIS — I420 Dilated cardiomyopathy: Secondary | ICD-10-CM | POA: Diagnosis present

## 2020-08-06 DIAGNOSIS — R7989 Other specified abnormal findings of blood chemistry: Secondary | ICD-10-CM

## 2020-08-06 DIAGNOSIS — N179 Acute kidney failure, unspecified: Secondary | ICD-10-CM | POA: Diagnosis present

## 2020-08-06 DIAGNOSIS — Z681 Body mass index (BMI) 19 or less, adult: Secondary | ICD-10-CM

## 2020-08-06 DIAGNOSIS — E872 Acidosis: Secondary | ICD-10-CM | POA: Diagnosis present

## 2020-08-06 DIAGNOSIS — I10 Essential (primary) hypertension: Secondary | ICD-10-CM | POA: Diagnosis present

## 2020-08-06 DIAGNOSIS — R079 Chest pain, unspecified: Secondary | ICD-10-CM | POA: Diagnosis not present

## 2020-08-06 DIAGNOSIS — I255 Ischemic cardiomyopathy: Secondary | ICD-10-CM | POA: Diagnosis present

## 2020-08-06 DIAGNOSIS — N1831 Chronic kidney disease, stage 3a: Secondary | ICD-10-CM | POA: Diagnosis not present

## 2020-08-06 DIAGNOSIS — I714 Abdominal aortic aneurysm, without rupture: Secondary | ICD-10-CM | POA: Diagnosis present

## 2020-08-06 DIAGNOSIS — Z79899 Other long term (current) drug therapy: Secondary | ICD-10-CM

## 2020-08-06 DIAGNOSIS — E785 Hyperlipidemia, unspecified: Secondary | ICD-10-CM | POA: Diagnosis present

## 2020-08-06 DIAGNOSIS — I4510 Unspecified right bundle-branch block: Secondary | ICD-10-CM | POA: Diagnosis present

## 2020-08-06 DIAGNOSIS — I248 Other forms of acute ischemic heart disease: Secondary | ICD-10-CM | POA: Diagnosis present

## 2020-08-06 DIAGNOSIS — I493 Ventricular premature depolarization: Secondary | ICD-10-CM | POA: Diagnosis not present

## 2020-08-06 DIAGNOSIS — J90 Pleural effusion, not elsewhere classified: Secondary | ICD-10-CM | POA: Diagnosis not present

## 2020-08-06 DIAGNOSIS — R0602 Shortness of breath: Secondary | ICD-10-CM | POA: Diagnosis not present

## 2020-08-06 DIAGNOSIS — R0789 Other chest pain: Secondary | ICD-10-CM | POA: Diagnosis not present

## 2020-08-06 DIAGNOSIS — I130 Hypertensive heart and chronic kidney disease with heart failure and stage 1 through stage 4 chronic kidney disease, or unspecified chronic kidney disease: Secondary | ICD-10-CM | POA: Diagnosis not present

## 2020-08-06 DIAGNOSIS — R636 Underweight: Secondary | ICD-10-CM | POA: Diagnosis present

## 2020-08-06 DIAGNOSIS — I517 Cardiomegaly: Secondary | ICD-10-CM | POA: Diagnosis not present

## 2020-08-06 DIAGNOSIS — H00012 Hordeolum externum right lower eyelid: Secondary | ICD-10-CM | POA: Diagnosis present

## 2020-08-06 DIAGNOSIS — I429 Cardiomyopathy, unspecified: Secondary | ICD-10-CM | POA: Diagnosis not present

## 2020-08-06 LAB — BASIC METABOLIC PANEL
Anion gap: 16 — ABNORMAL HIGH (ref 5–15)
BUN: 18 mg/dL (ref 8–23)
CO2: 19 mmol/L — ABNORMAL LOW (ref 22–32)
Calcium: 9.3 mg/dL (ref 8.9–10.3)
Chloride: 108 mmol/L (ref 98–111)
Creatinine, Ser: 1.37 mg/dL — ABNORMAL HIGH (ref 0.61–1.24)
GFR, Estimated: 54 mL/min — ABNORMAL LOW (ref >60)
Glucose, Bld: 172 mg/dL — ABNORMAL HIGH (ref 70–99)
Potassium: 4.3 mmol/L (ref 3.5–5.1)
Sodium: 143 mmol/L (ref 135–145)

## 2020-08-06 LAB — CBC
HCT: 39.7 % (ref 39.0–52.0)
HCT: 38.9 % — ABNORMAL LOW (ref 39.0–52.0)
Hemoglobin: 12.7 g/dL — ABNORMAL LOW (ref 13.0–17.0)
Hemoglobin: 13 g/dL (ref 13.0–17.0)
MCH: 32.1 pg (ref 26.0–34.0)
MCH: 33.6 pg (ref 26.0–34.0)
MCHC: 32 g/dL (ref 30.0–36.0)
MCHC: 33.4 g/dL (ref 30.0–36.0)
MCV: 100.3 fL — ABNORMAL HIGH (ref 80.0–100.0)
MCV: 100.5 fL — ABNORMAL HIGH (ref 80.0–100.0)
Platelets: 144 10*3/uL — ABNORMAL LOW (ref 150–400)
Platelets: 63 10*3/uL — ABNORMAL LOW (ref 150–400)
RBC: 3.96 MIL/uL — ABNORMAL LOW (ref 4.22–5.81)
RBC: 3.87 MIL/uL — ABNORMAL LOW (ref 4.22–5.81)
RDW: 15 % (ref 11.5–15.5)
RDW: 15.2 % (ref 11.5–15.5)
WBC: 7.3 10*3/uL (ref 4.0–10.5)
WBC: 6.5 10*3/uL (ref 4.0–10.5)
nRBC: 0 % (ref 0.0–0.2)
nRBC: 0 % (ref 0.0–0.2)

## 2020-08-06 LAB — CREATININE, SERUM
Creatinine, Ser: 1.42 mg/dL — ABNORMAL HIGH (ref 0.61–1.24)
GFR, Estimated: 52 mL/min — ABNORMAL LOW (ref >60)

## 2020-08-06 LAB — PROTIME-INR
INR: 1.3 — ABNORMAL HIGH (ref 0.8–1.2)
Prothrombin Time: 16 seconds — ABNORMAL HIGH (ref 11.4–15.2)

## 2020-08-06 LAB — MAGNESIUM: Magnesium: 1.6 mg/dL — ABNORMAL LOW (ref 1.7–2.4)

## 2020-08-06 LAB — LACTIC ACID, PLASMA
Lactic Acid, Venous: 2.9 mmol/L (ref 0.5–1.9)
Lactic Acid, Venous: 1.8 mmol/L (ref 0.5–1.9)

## 2020-08-06 LAB — TROPONIN I (HIGH SENSITIVITY)
Troponin I (High Sensitivity): 53 ng/L — ABNORMAL HIGH (ref <18)
Troponin I (High Sensitivity): 58 ng/L — ABNORMAL HIGH (ref <18)

## 2020-08-06 LAB — HEMOGLOBIN A1C
Hgb A1c MFr Bld: 6.1 % — ABNORMAL HIGH (ref 4.8–5.6)
Mean Plasma Glucose: 128.37 mg/dL

## 2020-08-06 LAB — RESP PANEL BY RT-PCR (FLU A&B, COVID) ARPGX2
Influenza A by PCR: NEGATIVE
Influenza B by PCR: NEGATIVE
SARS Coronavirus 2 by RT PCR: NEGATIVE

## 2020-08-06 LAB — TSH: TSH: 1.224 u[IU]/mL (ref 0.350–4.500)

## 2020-08-06 LAB — BRAIN NATRIURETIC PEPTIDE: B Natriuretic Peptide: >4500 pg/mL — ABNORMAL HIGH (ref 0.0–100.0)

## 2020-08-06 LAB — GLUCOSE, CAPILLARY: Glucose-Capillary: 132 mg/dL — ABNORMAL HIGH (ref 70–99)

## 2020-08-06 MED ORDER — ENOXAPARIN SODIUM 40 MG/0.4ML ~~LOC~~ SOLN
40.0000 mg | SUBCUTANEOUS | Status: DC
  Administered 2020-08-06 – 2020-08-07 (×2): 40 mg via SUBCUTANEOUS
  Filled 2020-08-06 (×2): qty 0.4

## 2020-08-06 MED ORDER — MAGNESIUM SULFATE 4 GM/100ML IV SOLN
4.0000 g | Freq: Once | INTRAVENOUS | Status: AC
  Administered 2020-08-06: 22:00:00 4 g via INTRAVENOUS
  Filled 2020-08-06 (×2): qty 100

## 2020-08-06 MED ORDER — INSULIN ASPART 100 UNIT/ML ~~LOC~~ SOLN: 0.0000 [IU] | Freq: Every day | SUBCUTANEOUS | Status: DC

## 2020-08-06 MED ORDER — FUROSEMIDE 10 MG/ML IJ SOLN
80.0000 mg | Freq: Two times a day (BID) | INTRAMUSCULAR | Status: DC
  Administered 2020-08-07: 09:00:00 80 mg via INTRAVENOUS
  Filled 2020-08-06: qty 8

## 2020-08-06 MED ORDER — SODIUM CHLORIDE 0.9% FLUSH
3.0000 mL | Freq: Two times a day (BID) | INTRAVENOUS | Status: DC
  Administered 2020-08-06 – 2020-08-07 (×3): 3 mL via INTRAVENOUS

## 2020-08-06 MED ORDER — ONDANSETRON HCL 4 MG/2ML IJ SOLN
4.0000 mg | Freq: Four times a day (QID) | INTRAMUSCULAR | Status: DC | PRN
  Filled 2020-08-06: qty 2

## 2020-08-06 MED ORDER — VITAMIN D 25 MCG (1000 UNIT) PO TABS
1000.0000 [IU] | ORAL_TABLET | Freq: Two times a day (BID) | ORAL | Status: DC
  Administered 2020-08-06 – 2020-08-10 (×8): 1000 [IU] via ORAL
  Filled 2020-08-06 (×8): qty 1

## 2020-08-06 MED ORDER — SODIUM CHLORIDE 0.9% FLUSH: 3.0000 mL | INTRAVENOUS | Status: DC | PRN

## 2020-08-06 MED ORDER — METOLAZONE 2.5 MG PO TABS
2.5000 mg | ORAL_TABLET | Freq: Once | ORAL | Status: AC
  Administered 2020-08-06: 18:00:00 2.5 mg via ORAL
  Filled 2020-08-06: qty 1

## 2020-08-06 MED ORDER — ACETAMINOPHEN 325 MG PO TABS: 650.0000 mg | ORAL_TABLET | ORAL | Status: DC | PRN

## 2020-08-06 MED ORDER — ASPIRIN EC 81 MG PO TBEC
81.0000 mg | DELAYED_RELEASE_TABLET | Freq: Every day | ORAL | Status: DC
  Administered 2020-08-07 – 2020-08-10 (×3): 81 mg via ORAL
  Filled 2020-08-06 (×4): qty 1

## 2020-08-06 MED ORDER — FUROSEMIDE 10 MG/ML IJ SOLN
20.0000 mg | Freq: Once | INTRAMUSCULAR | Status: AC
  Administered 2020-08-06: 11:00:00 20 mg via INTRAVENOUS
  Filled 2020-08-06: qty 2

## 2020-08-06 MED ORDER — SODIUM CHLORIDE 0.9 % IV SOLN: 250.0000 mL | INTRAVENOUS | Status: DC | PRN

## 2020-08-06 MED ORDER — ONDANSETRON HCL 4 MG/2ML IJ SOLN
4.0000 mg | Freq: Once | INTRAMUSCULAR | Status: AC
  Administered 2020-08-06: 4 mg via INTRAVENOUS
  Filled 2020-08-06: qty 2

## 2020-08-06 MED ORDER — SPIRONOLACTONE 12.5 MG HALF TABLET
12.5000 mg | ORAL_TABLET | Freq: Every day | ORAL | Status: DC
  Administered 2020-08-06 – 2020-08-07 (×2): 12.5 mg via ORAL
  Filled 2020-08-06 (×4): qty 1

## 2020-08-06 MED ORDER — FUROSEMIDE 10 MG/ML IJ SOLN
40.0000 mg | Freq: Once | INTRAMUSCULAR | Status: AC
  Administered 2020-08-06: 18:00:00 40 mg via INTRAVENOUS
  Filled 2020-08-06: qty 4

## 2020-08-06 MED ORDER — METOPROLOL SUCCINATE ER 50 MG PO TB24
50.0000 mg | ORAL_TABLET | Freq: Every day | ORAL | Status: DC
  Administered 2020-08-07 – 2020-08-10 (×4): 50 mg via ORAL
  Filled 2020-08-06 (×4): qty 1

## 2020-08-06 MED ORDER — FUROSEMIDE 10 MG/ML IJ SOLN
INTRAMUSCULAR | Status: AC
  Administered 2020-08-06: 13:00:00 40 mg via INTRAVENOUS
  Filled 2020-08-06: qty 4

## 2020-08-06 MED ORDER — SACUBITRIL-VALSARTAN 97-103 MG PO TABS
1.0000 | ORAL_TABLET | Freq: Two times a day (BID) | ORAL | Status: DC
  Administered 2020-08-06 – 2020-08-10 (×8): 1 via ORAL
  Filled 2020-08-06 (×10): qty 1

## 2020-08-06 MED ORDER — FUROSEMIDE 10 MG/ML IJ SOLN: 40.0000 mg | Freq: Two times a day (BID) | INTRAMUSCULAR | Status: DC

## 2020-08-06 MED ORDER — FUROSEMIDE 10 MG/ML IJ SOLN: 40.0000 mg | Freq: Once | INTRAMUSCULAR | Status: DC

## 2020-08-06 MED ORDER — INSULIN ASPART 100 UNIT/ML ~~LOC~~ SOLN: 0.0000 [IU] | Freq: Three times a day (TID) | SUBCUTANEOUS | Status: DC

## 2020-08-06 NOTE — Consult Note (Addendum)
Advanced Heart Failure Team Consult Note   Primary Physician: Linus Mako, NP PCP-Cardiologist:  Dr. Percival Spanish   Reason for Consultation: Acute on Chronic Combined Systolic and Diastolic Biventricular Heart Failure   HPI:    [name] is seen today for evaluation of acute on chronic combined systolic and diastolic heart failure, at the request of Dr. Reesa Chew, Internal Medicine.   74 y/o male, followed by Dr. Percival Spanish. Also gets medical care at the [facility] clinic in [place]. H/o chronic systolic heart failure w/ biventricular dysfunction. Notes indicate he has a NICM. No ischemic w/u has been conducted through [facility] (no cath or nuclear studies in EMR). Echos dating back to 2008 have shown chronic systolic heart failure, LVEF has been as low as 20%. Most recent echo 11/21 showed EF 20-25%, diffuse HK, G2DD, w/ moderately reduced RV systolic function and moderate MR. Other PMH includes HTN, Stage III CKD (baseline SCr ~1.5), PAD s/p bifem bypass and aorto-iliac aneursym repair, tobacco use, and abnormal chest CT 11/21 showing bilateral pulmonary nodules and mild mediastinal and hilar adenopathy. He reports he quit smoking cigarettes 15 years ago but still smoking cigars.   He was recently admitted 11/21 for a/c CHF and diuresed w/ IV Lasix. D/c summary notes he was to be discharged home on lasix 40 mg daily but not on home med list and he reports not taking a fluid pill.   PTA today was on limited medical therapy for systolic HF. On Entresto 97-103 bid and Toprol 50 mg daily.  No ICD.   He presents now to the ED w/ complaint of CP and dyspnea. Chronically NYHA Class II w/ recent progression to IIIb symptoms over the last 4 days. Reports full med compliance but admits to recent dietary indiscretion w/ sodium. His CP is atypical. Triggered by coughing (nonproductive). No fever or chills. No exertional component.   In ED COVID negative. BNP >4,500. CXR w/ cardiomegaly and pulmonary edema, Hs trop  53>>58. EKG shows SR w/ RBBB and PVCs. Tele w/ frequent PVCs/ NSVT. Scr 1.37. K 4.3. SBPs 140s/ DPBs 90s.   He has been started on IV Lasix 40 mg bid and being admitted by IM.     Most Recent Echo 11/21 1. Left ventricular ejection fraction, by estimation, is 20 to 25%. The left ventricle has severely decreased function. The left ventricle demonstrates global hypokinesis. The left ventricular internal cavity size was moderately dilated. Left ventricular diastolic parameters were normal. 2. Right ventricular systolic function is moderately reduced. The right ventricular size is moderately enlarged. There is moderately elevated pulmonary artery systolic pressure. 3. Left atrial size was mildly dilated. 4. Degree of functional MR worse compared to echo done July 2019. The mitral valve is normal in structure. Moderate mitral valve regurgitation. No evidence of mitral stenosis. 5. The aortic valve is normal in structure. Aortic valve regurgitation is mild. No aortic stenosis is present. 6. The inferior vena cava is dilated in size with <50% respiratory variability, suggesting right atrial pressure of 15 mmHg.  Review of Systems: [y] = yes, [ ]  = no   . General: Weight gain [ ] ; Weight loss [ ] ; Anorexia [ ] ; Fatigue [Y ]; Fever [ ] ; Chills [ ] ; Weakness [ ]   . Cardiac: Chest pain/pressure [Y ]; Resting SOB [Y ]; Exertional SOB [Y ]; Orthopnea [ ] ; Pedal Edema [ ] ; Palpitations [ ] ; Syncope [ ] ; Presyncope [ ] ; Paroxysmal nocturnal dyspnea[ ]   . Pulmonary: Cough [ Y]; Wheezing[ ] ;  Hemoptysis[ ] ; Sputum [ ] ; Snoring [ ]   . GI: Vomiting[ ] ; Dysphagia[ ] ; Melena[ ] ; Hematochezia [ ] ; Heartburn[ ] ; Abdominal pain [ ] ; Constipation [ ] ; Diarrhea [ ] ; BRBPR [ ]   . GU: Hematuria[ ] ; Dysuria [ ] ; Nocturia[ ]   . Vascular: Pain in legs with walking [ ] ; Pain in feet with lying flat [ ] ; Non-healing sores [ ] ; Stroke [ ] ; TIA [ ] ; Slurred speech [ ] ;  . Neuro: Headaches[ ] ; Vertigo[ ] ; Seizures[ ] ;  Paresthesias[ ] ;Blurred vision [Y ]; Diplopia [ ] ; Vision changes [ ]   . Ortho/Skin: Arthritis [ ] ; Joint pain [ ] ; Muscle pain [ ] ; Joint swelling [ ] ; Back Pain [ ] ; Rash [ ]   . Psych: Depression[ ] ; Anxiety[ ]   . Heme: Bleeding problems [ ] ; Clotting disorders [ ] ; Anemia [ ]   . Endocrine: Diabetes [ ] ; Thyroid dysfunction[ ]   Home Medications Prior to Admission medications   Medication Sig Start Date End Date Taking? Authorizing Provider  aspirin 81 MG tablet Take 81 mg by mouth daily.   Yes [provider]  cholecalciferol (VITAMIN D) 1000 UNITS tablet Take 1,000 Units by mouth 2 (two) times daily.    Yes [provider]  furosemide (LASIX) 20 MG tablet Take 20 mg by mouth daily.   Yes [provider]  metoprolol succinate (TOPROL-XL) 100 MG 24 hr tablet Take 50 mg by mouth daily. Take with or immediately following a meal.   Yes [provider]  sacubitril-valsartan (ENTRESTO) 97-103 MG Take 1 tablet by mouth 2 (two) times daily. 05/19/18  Yes Minus Breeding, MD    Past Medical History: Past Medical History:  Diagnosis Date  . ABDOMINAL AORTIC ANEURYSM   . CAD    Non obstructive (mild) cath 2007  . CHF    EF 15% in 2007, 25% currently  . GLAUCOMA   . HYPERTENSION   . TOBACCO ABUSE    Previous    Past Surgical History: Past Surgical History:  Procedure Laterality Date  . Clavical Repair    . CORNEAL TRANSPLANT     Right eye x 2  . EYE SURGERY     Glaucoma    Family History: No family history on file.  Social History: Social History   Socioeconomic History  . Marital status: Single    Spouse name: Not on file  . Number of children: Not on file  . Years of education: Not on file  . Highest education level: Not on file  Occupational History  . Not on file  Tobacco Use  . Smoking status: Former Smoker    Quit date: 03/08/2008    Years since quitting: 12.4  . Smokeless tobacco: Never Used  Substance and Sexual Activity  .  Alcohol use: No  . Drug use: No  . Sexual activity: Not on file  Other Topics Concern  . Not on file  Social History Narrative  . Not on file   Social Determinants of Health   Financial Resource Strain: Not on file  Food Insecurity: Not on file  Transportation Needs: Not on file  Physical Activity: Not on file  Stress: Not on file  Social Connections: Not on file    Allergies:  No Known Allergies  Objective:    Vital Signs:   Temp:  [97.6 F (36.4 C)] 97.6 F (36.4 C) (12/14 0653) Pulse Rate:  [75-93] 76 (12/14 1345) Resp:  [16-38] 19 (12/14 1330) BP: (107-149)/(78-106) 119/93 (12/14 1345) SpO2:  [  78 %-100 %] 92 % (12/14 1345) Weight:  [65 kg] 65 kg (12/14 0648)    Weight change: Filed Weights   08/06/20 0648  Weight: 65 kg    Intake/Output:   Intake/Output Summary (Last 24 hours) at 08/06/2020 1402 Last data filed at 08/06/2020 1330 Gross per 24 hour  Intake 3 ml  Output --  Net 3 ml      Physical Exam    General:  Thin elderly AAM No resp difficulty HEENT: + rt cataract otherwise normal Neck: supple. JVP elevated to jaw . Carotids 2+ bilat; no bruits. No lymphadenopathy or thyromegaly appreciated. Cor: PMI nondisplaced. Regular rate & rhythm. No rubs, gallops or murmurs. Lungs: decreased BS at the bases bilaterally, no wheezing  Abdomen: soft, nontender, nondistended. No hepatosplenomegaly. No bruits or masses. Good bowel sounds. Extremities: no cyanosis, clubbing, rash, edema Neuro: alert & orientedx3, cranial nerves grossly intact. moves all 4 extremities w/o difficulty. Affect pleasant   Telemetry   NSR w/ frequent PVCs/ NSVT   EKG    NSR w/ RBBB and PVCs   Labs   Basic Metabolic Panel: Recent Labs  Lab 08/06/20 0655  NA 143  K 4.3  CL 108  CO2 19*  GLUCOSE 172*  BUN 18  CREATININE 1.37*  CALCIUM 9.3    Liver Function Tests: No results for input(s): AST, ALT, ALKPHOS, BILITOT, PROT, ALBUMIN in the last 168 hours. No results  for input(s): LIPASE, AMYLASE in the last 168 hours. No results for input(s): AMMONIA in the last 168 hours.  CBC: Recent Labs  Lab 08/06/20 0655  WBC 7.3  HGB 12.7*  HCT 39.7  MCV 100.3*  PLT 144*    Cardiac Enzymes: No results for input(s): CKTOTAL, CKMB, CKMBINDEX, TROPONINI in the last 168 hours.  BNP: BNP (last 3 results) Recent Labs    06/30/20 2354 08/06/20 0941  BNP 3,205.6* >4,500.0*    ProBNP (last 3 results) No results for input(s): PROBNP in the last 8760 hours.   CBG: No results for input(s): GLUCAP in the last 168 hours.  Coagulation Studies: Recent Labs    08/06/20 0655  LABPROT 16.0*  INR 1.3*     Imaging   DG Chest 2 View  Result Date: 08/06/2020 CLINICAL DATA:  Shortness of breath. EXAM: CHEST - 2 VIEW COMPARISON:  Chest radiograph and CTA 07/01/2020 FINDINGS: The cardiac silhouette remains mildly enlarged. Pulmonary vascular congestion and bilateral interstitial opacities are similar to the prior study. There may be trace pleural effusions. No pneumothorax is identified. An abdominal aortic stent graft is partially visualized. No acute osseous abnormality is seen. IMPRESSION: Cardiomegaly with bilateral lung opacities compatible with edema. Electronically Signed   By: Logan Bores M.D.   On: 08/06/2020 07:10      Medications:     Current Medications: . [START ON 08/07/2020] aspirin EC  81 mg Oral Daily  . cholecalciferol  1,000 Units Oral BID  . enoxaparin (LOVENOX) injection  40 mg Subcutaneous Q24H  . furosemide  40 mg Intravenous Q12H  . [START ON 08/07/2020] metoprolol succinate  50 mg Oral Daily  . sacubitril-valsartan  1 tablet Oral BID  . sodium chloride flush  3 mL Intravenous Q12H     Infusions: . sodium chloride       Assessment/Plan   1. 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 Reported as NICM (notes indicate  nonobstructive CAD on  cath in 2007, study report not on file) - Most recent echo 11/21 LVEF 20-25%, G2DD, diffuse HK, moderately reduced RV - This is 2nd admit for a/c CHF in 6 weeks. Now back w/ NYHA Class IIIb symptoms - BNP >4,500. CXR w/ pulmonary edema. SCr 1.4 c/w baseline - Diurese w/ IV Lasix. Increase to 80 mg BID - Add 2.5 of metolazone x 1  - Needs R/LHC after diuresis  - Consider cMRI (? Sarcoid, recent CT w/ pulmonary nodules and mild mediastinal and hilar adenopathy) - Needs optimization of meds - Continue Entresto 97-103 - Continue Toprol XL 50 mg daily  - Will try adding SGLT2i, Spiro +/- digoxin  - May need eventual ICD  - Check TSH - Monitor PVC burden on tele (may be contributing to CMP)  - Plan outpatient sleep study  - Consider CardioMEMS Implant    2. Chest Pain w/ Mildly Elevated Troponin  - CP atypical and not c/w ischemic CP  - Hs trop flat and low level, 53>>58, likely demand ischemia from a/c CHF - however multiple risk factors for IHD + frequent PVCs, will need LHC to r/o CAD   3. Stage IIIa CKD - Baseline SCr ~1.4-1.5 - 1.42 on admit - monitor w/ diuresis  - plan to add SGLT2i soon   4. PVCs  -Frequent on tele, in setting of LVEF <35% - Continue Toprol 50 mg daily - May need AAD therapy to reduce burden   - Check TSH - Keep K > 4.0 and Mg >2.0 - Eventual Cath to r/o ischemia  - Outpatient sleep study to r/o OSA - W/ chronically low EF, will need EP evaluation for potential ICD   5. HTN:  - moderately elevated, [id number] systolic - diurese w/ IV Lasix - Continue Entresto + Toprol - Add Spiro 12.5 mg daily   6. PAD - s/p bifem bypass and aorto-iliac aneursym repair at [facility]  - denies claudication  - continue ASA  - Not on statin. Check FLP in AM   7. Pulmonary Nodules - bilateral pulmonary nodules noted on chest CT 11/21 measuring up to approximately 7 mm - Non-contrast chest CT at 3-6 months is recommended - smoking cessation  advised    Length of Stay: 0  Brittainy Simmons, PA-C  08/06/2020, 2:02 PM  Advanced Heart Failure Team Pager [phone] (M-F; 7a - 4p)  Please contact [facility] for night-coverage after hours (4p -7a ) and weekends on [email or web address]  Patient seen and examined with the above-signed Advanced Practice Provider and/or Housestaff. I personally reviewed laboratory data, imaging studies and relevant notes. I independently examined the patient and formulated the important aspects of the plan. I have edited the note to reflect any of my changes or salient points. I have personally discussed the plan with the patient and/or family.  74 y/o male with HTN, PAD. CKD 3a and chronic systolic HF due to presumed NICM EF 25%.   Admitted last month with ADHF and diuresed. Now readmitted with recurrent HF. Has not responded well to IV lasix 40 mg.   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   General:  Elderly  No resp difficulty HEENT: normal Neck: supple. JVP to jaw Carotids 2+ bilat; no bruits. No lymphadenopathy or thryomegaly appreciated. Cor: PMI nondisplaced. Regular rate & rhythm. No rubs, gallops or murmurs. Distant HS Lungs: + wheezing Abdomen: soft, nontender, nondistended. No hepatosplenomegaly. No bruits or masses. Good bowel sounds. Extremities: no cyanosis, clubbing, rash,  edema Neuro: alert & orientedx3, cranial nerves grossly intact. moves all 4 extremities w/o difficulty. Affect pleasant  He is significantly volume overloaded. Will diurese. Titrate HF meds. Will need R?L cath and cMRI.   Glori Bickers, MD  6:29 PM

## 2020-08-06 NOTE — H&P (Signed)
History and Physical    [name] ZOX:[id number] DOB: 11/03/1945 DOA: 08/06/2020  PCP: Linus Mako, NP   Patient coming from: Home  I have personally briefly reviewed patient's old medical records in [place]  Chief Complaint: Worsening shortness of breath and chest pain  HPI: [name] is a 74 y.o. male with medical history significant of HFrEF(EF 20 to 25%), dilated cardiomyopathy, CKD stage IIIa, PAD and hypertension came to ED with worsening shortness of breath and left-sided chest pain which increased with coughing and deep breathing started on Friday with some increase in cough but no congestion. Denies any fever or chills. Recent admission with exacerbation of HFrEF where he was diuresed with IV Lasix, not sure whether he was taking it at home or not. Per patient he does follow-up with his cardiologist but not at heart failure clinic. Most of his care is done at [place]. Patient with history of orthopnea but no PND. No nausea, vomiting or diarrhea. No urinary symptoms. No recent change in appetite or bowel habits.  ED Course: Hemodynamically stable, initial hypoxia requiring 2 L of oxygen, does not use oxygen at baseline, labs positive for thrombocytopenia with platelet of 63, creatinine of 1.42 with baseline of 1.4-1.5, CO2 of 19, chest x-ray with concern of pulmonary edema. Admitted for acute exacerbation of his HFrEF. Heart failure team was consulted.  Review of Systems: As per HPI otherwise 10 point review of systems negative.   Past Medical History:  Diagnosis Date  . ABDOMINAL AORTIC ANEURYSM   . CAD    Non obstructive (mild) cath 2007  . CHF    EF 15% in 2007, 25% currently  . GLAUCOMA   . HYPERTENSION   . TOBACCO ABUSE    Previous    Past Surgical History:  Procedure Laterality Date  . Clavical Repair    . CORNEAL TRANSPLANT     Right eye x 2  . EYE SURGERY     Glaucoma     reports that he quit smoking about 12 years ago. He has never used  smokeless tobacco. He reports that he does not drink alcohol and does not use drugs.  No Known Allergies  No family history on file.  Prior to Admission medications   Medication Sig Start Date End Date Taking? Authorizing Provider  aspirin 81 MG tablet Take 81 mg by mouth daily.   Yes [provider]  cholecalciferol (VITAMIN D) 1000 UNITS tablet Take 1,000 Units by mouth 2 (two) times daily.    Yes [provider]  furosemide (LASIX) 20 MG tablet Take 20 mg by mouth daily.   Yes [provider]  metoprolol succinate (TOPROL-XL) 100 MG 24 hr tablet Take 50 mg by mouth daily. Take with or immediately following a meal.   Yes [provider]  sacubitril-valsartan (ENTRESTO) 97-103 MG Take 1 tablet by mouth 2 (two) times daily. 05/19/18  Yes Minus Breeding, MD    Physical Exam: Vitals:   08/06/20 1519 08/06/20 1541 08/06/20 1558 08/06/20 1948  BP: (!) 155/99 (!) 142/88 (!) 131/96 131/76  Pulse: 88 80 86 87  Resp: (!) 24 20 18 18   Temp: 98.5 F (36.9 C) (!) 97.3 F (36.3 C) 97.9 F (36.6 C) 97.7 F (36.5 C)  TempSrc: Oral  Oral Oral  SpO2: 96% 97% 100% 98%  Weight:      Height:        General: Vital signs reviewed.  Patient is well-developed and well-nourished, in no  acute distress and cooperative with exam.  Head: Normocephalic and atraumatic. Eyes: EOMI, conjunctivae normal, no scleral icterus.  ENMT: Mucous membranes are moist.  Neck: Supple, trachea midline, normal ROM,  JVD +ve. Cardiovascular: RRR, S1 normal, S2 normal, no murmurs, gallops, or rubs. Pulmonary/Chest: Bilateral basal crackles. Abdominal: Soft, non-tender, non-distended, BS +,  Extremities: No lower extremity edema bilaterally,  pulses symmetric and intact bilaterally. No cyanosis or clubbing. Neurological: A&O x3, Strength is normal and symmetric bilaterally, cranial nerve II-XII are grossly intact, no focal motor deficit, sensory intact to light touch bilaterally.  Skin:  Warm, dry and intact. No rashes or erythema. Psychiatric: Normal mood and affect. speech and behavior is normal. Cognition and memory are normal.   Labs on Admission: I have personally reviewed following labs and imaging studies  CBC: Recent Labs  Lab 08/06/20 0655 08/06/20 1317  WBC 7.3 6.5  HGB 12.7* 13.0  HCT 39.7 38.9*  MCV 100.3* 100.5*  PLT 144* 63*   Basic Metabolic Panel: Recent Labs  Lab 08/06/20 0655 08/06/20 1317 08/06/20 1630  NA 143  --   --   K 4.3  --   --   CL 108  --   --   CO2 19*  --   --   GLUCOSE 172*  --   --   BUN 18  --   --   CREATININE 1.37* 1.42*  --   CALCIUM 9.3  --   --   MG  --   --  1.6*   GFR: Estimated Creatinine Clearance: 42 mL/min (A) (by C-G formula based on SCr of 1.42 mg/dL (H)). Liver Function Tests: No results for input(s): AST, ALT, ALKPHOS, BILITOT, PROT, ALBUMIN in the last 168 hours. No results for input(s): LIPASE, AMYLASE in the last 168 hours. No results for input(s): AMMONIA in the last 168 hours. Coagulation Profile: Recent Labs  Lab 08/06/20 0655  INR 1.3*   Cardiac Enzymes: No results for input(s): CKTOTAL, CKMB, CKMBINDEX, TROPONINI in the last 168 hours. BNP (last 3 results) No results for input(s): PROBNP in the last 8760 hours. HbA1C: Recent Labs    08/06/20 1317  HGBA1C 6.1*   CBG: No results for input(s): GLUCAP in the last 168 hours. Lipid Profile: No results for input(s): CHOL, HDL, LDLCALC, TRIG, CHOLHDL, LDLDIRECT in the last 72 hours. Thyroid Function Tests: Recent Labs    08/06/20 1630  TSH 1.224   Anemia Panel: No results for input(s): VITAMINB12, FOLATE, FERRITIN, TIBC, IRON, RETICCTPCT in the last 72 hours. Urine analysis: No results found for: COLORURINE, APPEARANCEUR, LABSPEC, [place], GLUCOSEU, HGBUR, BILIRUBINUR, KETONESUR, PROTEINUR, UROBILINOGEN, NITRITE, LEUKOCYTESUR  Radiological Exams on Admission: DG Chest 2 View  Result Date: 08/06/2020 CLINICAL DATA:  Shortness of  breath. EXAM: CHEST - 2 VIEW COMPARISON:  Chest radiograph and CTA 07/01/2020 FINDINGS: The cardiac silhouette remains mildly enlarged. Pulmonary vascular congestion and bilateral interstitial opacities are similar to the prior study. There may be trace pleural effusions. No pneumothorax is identified. An abdominal aortic stent graft is partially visualized. No acute osseous abnormality is seen. IMPRESSION: Cardiomegaly with bilateral lung opacities compatible with edema. Electronically Signed   By: Logan Bores M.D.   On: 08/06/2020 07:10    EKG: Independently reviewed. Sinus rhythm with right bundle branch block and multiple PVCs.  Assessment/Plan Active Problems:   Acute on chronic HFrEF (heart failure with reduced ejection fraction) (HCC)   Acute on chronic HFrEF. Patient with dilated cardiomyopathy and EF of 20 to 25%.  Recent admission with similar symptoms and he was discharged on Lasix 40 mg daily, not sure whether he was taking it or not. BNP markedly elevated above 4500. Heart failure team was consulted-they are recommending right and left cardiac catheterization after diuresis. -IV Lasix 80 mg twice daily. -Metolazone was added by heart failure team. -Aldactone was added by heart failure team. -Continue with Entresto. -Continue with metoprolol. -Strict intake and output -Daily weight and BMP.  PAD.  -Continue with aspirin -Patient was not on statin. -Check lipid profile tomorrow morning.  CKD stage IIIa. Baseline creatinine around 1.4-1.5, current at 1.37 -Continue to monitor while he is being diuresed. -Avoid nephrotoxins.  Chest pain/elevated troponin. Patient has atypical chest pain which increased with coughing and deep breathing. Troponin mildly elevated with a flat curve most likely secondary to demand. -Continue to monitor.  Anion gap metabolic acidosis. Bicarb of 19 with anion gap of 16. Patient with no diagnosis of diabetes. Some hyperglycemia and A1c of 6.1. Will  check lactic acid.  Hyperglycemia. No prior diagnosis of diabetes, A1c was 6.1 which makes him prediabetic. -Monitor CBG. -SSI as needed  Hypomagnesemia. Replete magnesium and keep it above 2 to prevent cardiac arrhythmias. -Continue to monitor   DVT prophylaxis: Lovenox Code Status: Full code Family Communication: Discussed with patient Disposition Plan: Home Consults called: Cardiology Admission status: Inpatient   Lorella Nimrod MD [facility]  If 7PM-7AM, please contact night-coverage [email or web address]  08/06/2020, 7:53 PM   This record has been created using Systems analyst. Errors have been sought and corrected,but may not always be located. Such creation errors do not reflect on the standard of care.

## 2020-08-06 NOTE — Progress Notes (Signed)
CRITICAL VALUE ALERT  Critical Value:  Lactic acid 2.9  Date & Time Notied:  08/06/20 2105  Provider Notified: Marlowe Sax  Orders Received/Actions taken: tbd

## 2020-08-06 NOTE — ED Triage Notes (Signed)
Patient reports increasing left lateral chest pain with SOB , cough , chest congestion onset last week , no emesis or diaphoresis .

## 2020-08-06 NOTE — ED Provider Notes (Signed)
[facility] EMERGENCY DEPARTMENT Provider Note  Failure CSN: [id number] Arrival date & time: 08/06/20  0712     History Chief Complaint  Patient presents with  . Chest Pain    SOB    [name] is a 75 y.o. male with history of CHF with ejection fraction of 20-25% who presents with 5 days of worsening shortness of breath.  Patient states he is not on oxygen at home, however was placed on 2 L of oxygen by nasal cannula upon presentation to the emergency department due to increased work of breathing.  Oxygen saturations remain > 95%.   Patient is concerned as he was recently admitted in November of this year for CHF exacerbation, but prior to that was not admitted for exacerbation for several years.   He endorses pain in his chest when he coughs, however not at rest.  Endorses dry cough.  Denies palpitations, abdominal pain, nausea, vomiting, diarrhea, dysuria.  Denies fevers and chills at home, however states that when his shortness of breath is severe he will occasionally break out into a "cold sweat" across his forehead.  Patient has been vaccinated as COVID-19.  He is not on any oxygen at home.  He follows with [facility], appointment scheduled for January 27.  I have personally reviewed this patient's medical records.  He has history of hypertension, chronic systolic heart failure, lipidemia, abdominal aortic aneurysm without rupture, PVD, CKD age 2 they, tobacco use.  Patient was admitted 07/01/2019 for Acute h on chronic congestive heart failure with hypoxia. CT PE study at that time was negative.  HPI  HPI: A 74 year old patient with a history of peripheral artery disease, hypertension and hypercholesterolemia presents for evaluation of chest pain. Initial onset of pain was approximately 1-3 hours ago. The patient's chest pain is described as heaviness/pressure/tightness and is not worse with exertion. The patient's chest pain is not middle- or  left-sided, is not well-localized, is not sharp and does not radiate to the arms/jaw/neck. The patient does not complain of nausea and denies diaphoresis. The patient has no history of stroke, has not smoked in the past 90 days, denies any history of treated diabetes, has no relevant family history of coronary artery disease (first degree relative at less than age 20) and does not have an elevated BMI (>=30).   Past Medical History:  Diagnosis Date  . ABDOMINAL AORTIC ANEURYSM   . CAD    Non obstructive (mild) cath 2007  . CHF    EF 15% in 2007, 25% currently  . GLAUCOMA   . HYPERTENSION   . TOBACCO ABUSE    Previous    Patient Active Problem List   Diagnosis Date Noted  . Acute on chronic HFrEF (heart failure with reduced ejection fraction) ([place]) 08/06/2020  . Acute on chronic combined systolic and diastolic CHF (congestive heart failure) ([place]) 07/01/2020  . Acute on chronic respiratory failure with hypoxia ([place]) 07/01/2020  . Chronic kidney disease, stage 2a ([place]) 07/01/2020  . Thrombocytopenia ([place]) 07/01/2020  . Positive D dimer 07/01/2020  . Acute hypoxemic respiratory failure ([place])   . PVD (peripheral vascular disease) ([place]) 08/29/2019  . Educated about COVID-19 virus infection 08/29/2019  . Bradycardia 09/05/2018  . Dyslipidemia 09/05/2018  . AAA (abdominal aortic aneurysm) without rupture ([place]) 09/05/2018  . Heart block 03/10/2018  . Cardiomyopathy ([place]) 10/29/2017  . TOBACCO ABUSE 11/30/2008  . CHRONIC SYSTOLIC HEART FAILURE 19/75/8832  . ABDOMINAL AORTIC ANEURYSM 11/30/2008  . GLAUCOMA 11/29/2008  .  Essential hypertension 11/29/2008  . Coronary atherosclerosis 11/29/2008  . VENTRICULAR TACHYCARDIA 11/29/2008  . CHF 11/29/2008    Past Surgical History:  Procedure Laterality Date  . Clavical Repair    . CORNEAL TRANSPLANT     Right eye x 2  . EYE SURGERY     Glaucoma       No family history on file.  Social History   Tobacco Use  . Smoking status:  Former Smoker    Quit date: 03/08/2008    Years since quitting: 12.4  . Smokeless tobacco: Never Used  Substance Use Topics  . Alcohol use: No  . Drug use: No    Home Medications Prior to Admission medications   Medication Sig Start Date End Date Taking? Authorizing Provider  aspirin 81 MG tablet Take 81 mg by mouth daily.   Yes [provider]  cholecalciferol (VITAMIN D) 1000 UNITS tablet Take 1,000 Units by mouth 2 (two) times daily.    Yes [provider]  furosemide (LASIX) 20 MG tablet Take 20 mg by mouth daily.   Yes [provider]  metoprolol succinate (TOPROL-XL) 100 MG 24 hr tablet Take 50 mg by mouth daily. Take with or immediately following a meal.   Yes [provider]  sacubitril-valsartan (ENTRESTO) 97-103 MG Take 1 tablet by mouth 2 (two) times daily. 05/19/18  Yes Minus Breeding, MD    Allergies    Patient has no known allergies.  Review of Systems   Review of Systems  Constitutional: Positive for activity change, diaphoresis and fatigue. Negative for appetite change, chills and fever.  HENT: Negative.   Eyes: Negative.        Blind in right eye at baseline  Respiratory: Positive for cough, chest tightness, shortness of breath and wheezing.   Cardiovascular: Negative for chest pain, palpitations and leg swelling.  Gastrointestinal: Negative.  Negative for abdominal pain, nausea and vomiting.  Genitourinary: Negative.   Musculoskeletal: Negative.   Skin: Negative.   Neurological: Negative.  Negative for dizziness, speech difficulty, weakness and light-headedness.  Psychiatric/Behavioral: Negative.     Physical Exam Updated Vital Signs BP (!) 146/91 (BP Location: Right Arm)   Pulse 76   Temp 97.6 F (36.4 C) (Oral)   Resp 16   Ht 5\' 11"  (1.803 m)   Wt 65 kg   SpO2 98%   BMI 19.99 kg/m   Physical Exam Vitals and nursing note reviewed.  Constitutional:      Appearance: He is underweight.  HENT:     Head:  Normocephalic and atraumatic.     Nose: Nose normal.     Mouth/Throat:     Mouth: Mucous membranes are moist.     Pharynx: Oropharynx is clear. Uvula midline. No oropharyngeal exudate or posterior oropharyngeal erythema.     Tonsils: No tonsillar exudate.  Eyes:     General:        Right eye: No discharge.        Left eye: No discharge.     Conjunctiva/sclera: Conjunctivae normal.     Pupils:     Left eye: Pupil is round, reactive and not sluggish.     Comments: Left pupil is round and reactive to light, right iris and pupil obscured with scarring (patient with history of 5 failed corneal transplant)  Neck:     Trachea: Trachea normal. No tracheal deviation.  Cardiovascular:     Rate and Rhythm: Normal rate. Rhythm irregular.     Pulses:  Radial pulses are 2+ on the right side and 2+ on the left side.       Dorsalis pedis pulses are 1+ on the right side and 1+ on the left side.     Heart sounds: Heart sounds are distant. No murmur heard.   Pulmonary:     Effort: Tachypnea and accessory muscle usage present. No respiratory distress.     Breath sounds: Examination of the right-middle field reveals rales. Examination of the left-middle field reveals rales. Examination of the right-lower field reveals rales. Examination of the left-lower field reveals rales. Rales present.     Comments: Patient speaking in short, choppy sentences due to shortness of breath. On supplemental oxygen, 2 L via [place] with sats > 95%. Chest:     Chest wall: No deformity, swelling, tenderness, crepitus or edema.  Abdominal:     General: Bowel sounds are normal. There is no distension.     Palpations: Abdomen is soft.     Tenderness: There is no abdominal tenderness.  Musculoskeletal:        General: No deformity.     Cervical back: Full passive range of motion without pain, normal range of motion and neck supple. No edema or rigidity. No pain with movement or spinous process tenderness.     Right lower  leg: No edema.     Left lower leg: No edema.  Lymphadenopathy:     Cervical: No cervical adenopathy.  Skin:    General: Skin is warm and dry.     Capillary Refill: Capillary refill takes less than 2 seconds.       Neurological:     General: No focal deficit present.     Mental Status: He is alert and oriented to person, place, and time. Mental status is at baseline.  Psychiatric:        Mood and Affect: Mood normal.     ED Results / Procedures / Treatments   Labs (all labs ordered are listed, but only abnormal results are displayed) Labs Reviewed  BASIC METABOLIC PANEL - Abnormal; Notable for the following components:      Result Value   CO2 19 (*)    Glucose, Bld 172 (*)    Creatinine, Ser 1.37 (*)    GFR, Estimated 54 (*)    Anion gap 16 (*)    All other components within normal limits  CBC - Abnormal; Notable for the following components:   RBC 3.96 (*)    Hemoglobin 12.7 (*)    MCV 100.3 (*)    Platelets 144 (*)    All other components within normal limits  PROTIME-INR - Abnormal; Notable for the following components:   Prothrombin Time 16.0 (*)    INR 1.3 (*)    All other components within normal limits  BRAIN NATRIURETIC PEPTIDE - Abnormal; Notable for the following components:   B Natriuretic Peptide >4,500.0 (*)    All other components within normal limits  TROPONIN I (HIGH SENSITIVITY) - Abnormal; Notable for the following components:   Troponin I (High Sensitivity) 53 (*)    All other components within normal limits  TROPONIN I (HIGH SENSITIVITY) - Abnormal; Notable for the following components:   Troponin I (High Sensitivity) 58 (*)    All other components within normal limits  RESP PANEL BY RT-PCR (FLU A&B, COVID) ARPGX2  CBC  CREATININE, SERUM    EKG EKG Interpretation  Date/Time:  Tuesday August 06 2020 09:33:19 EST Ventricular Rate:  82 PR Interval:  QRS Duration: 143 QT Interval:  430 QTC Calculation: 503 R Axis:   -85 Text  Interpretation: Sinus rhythm Multiple ventricular premature complexes Prolonged PR interval Probable left atrial enlargement IVCD, consider atypical RBBB Inferior infarct, old Anterolateral infarct, age indeterminate NSR, PVCs, possible RBBB, unchanged from previous Confirmed by Lavenia Atlas [phone]) on 08/06/2020 9:43:02 AM   Radiology DG Chest 2 View  Result Date: 08/06/2020 CLINICAL DATA:  Shortness of breath. EXAM: CHEST - 2 VIEW COMPARISON:  Chest radiograph and CTA 07/01/2020 FINDINGS: The cardiac silhouette remains mildly enlarged. Pulmonary vascular congestion and bilateral interstitial opacities are similar to the prior study. There may be trace pleural effusions. No pneumothorax is identified. An abdominal aortic stent graft is partially visualized. No acute osseous abnormality is seen. IMPRESSION: Cardiomegaly with bilateral lung opacities compatible with edema. Electronically Signed   By: Logan Bores M.D.   On: 08/06/2020 07:10    Procedures Procedures (including critical care time)  Medications Ordered in ED Medications  sodium chloride flush (NS) 0.9 % injection 3 mL (has no administration in time range)  sodium chloride flush (NS) 0.9 % injection 3 mL (has no administration in time range)  0.9 %  sodium chloride infusion (has no administration in time range)  acetaminophen (TYLENOL) tablet 650 mg (has no administration in time range)  ondansetron (ZOFRAN) injection 4 mg (has no administration in time range)  enoxaparin (LOVENOX) injection 40 mg (has no administration in time range)  furosemide (LASIX) injection 40 mg (40 mg Intravenous Given 08/06/20 1328)  aspirin EC tablet 81 mg (has no administration in time range)  cholecalciferol (VITAMIN D3) tablet 1,000 Units (has no administration in time range)  metoprolol succinate (TOPROL-XL) 24 hr tablet 50 mg (has no administration in time range)  sacubitril-valsartan (ENTRESTO) 97-103 mg per tablet (has no administration in  time range)  furosemide (LASIX) injection 20 mg (20 mg Intravenous Given 08/06/20 1044)  ondansetron (ZOFRAN) injection 4 mg (4 mg Intravenous Given 08/06/20 1044)    ED Course  I have reviewed the triage vital signs and the nursing notes.  Pertinent labs & imaging results that were available during my care of the patient were reviewed by me and considered in my medical decision making (see chart for details).    MDM Rules/Calculators/A&P HEAR Score: 77                       74 year old male with history of CHF and recent hospital admission due to exacerbation, who presents with 5 days of worsening shortness of breath.  Patient placed on 2 L supplemental oxygen via [place] upon arrival due to increased work of breathing.  Maintaining saturations > 95%.  Patient is vaccinated as COVID-19.  Differential diagnosis for this patient symptoms include but are not limited to pulmonary edema secondary to CHF exacerbation, pleural effusion, pneumonia, PE, ACS, arrhythmia, anemia.   Patient hypertensive on intake to 138/97, tachypneic to 28 breaths/min.  Patient was not hypoxic, however he was placed on 2 L supplemental oxygen by nasal cannula, due to increased work of breathing.  At the time of my exam, the patient is tachypneic, he is speaking in short choppy sentences due to dyspnea.  There are rales in the mid lung fields as well as the bases bilaterally.  Cardiac exam with irregular rhythm, normal heart rate.  Abdominal exam benign.  EKG with normal sinus rhythm, multiple PVCs, possible right bundle branch block, however unchanged from previous.  Chest x-ray with  cardiomegaly and bilateral lung opacities compatible with edema.  CBC showed mild anemia of 12.7, previously 14.3 one month ago.  CMP with elevated creatinine 1.37, improved from prior which was 1.51 one month ago.  Troponin 53, at patient baseline.  BNP >4,500.   Will proceed with diuresis while in the ED, I feel patient would benefit  from admission to the hospital time.  Based on unchanged EKG, troponin at baseline, and obvious evidence of CHF exacerbation with elevated BNP and pleural chest x-ray, do not feel there is an ACS component to this patient's presentation.  Feel her symptoms are secondary to severe CHF exacerbation.  Consult placed to Hospitalist, Dr.Amin, who is agreeable to admitting the patient to her service.  I appreciate her collaboration care of this patient.   Tyde voiced understanding of his medical evaluation and treatment plan.  Each of his questions were answered to his expressed satisfaction.  He is amenable to the plan to be admitted to the hospital this time.  Final Clinical Impression(s) / ED Diagnoses Final diagnoses:  None    Rx / DC Orders ED Discharge Orders    None       Emeline Darling, PA-C 08/06/20 1337    Lorelle Gibbs, DO 08/12/20 1859

## 2020-08-06 NOTE — ED Notes (Signed)
Got patient into a gown on the monitor patient is resting with call bell in reach got patient a warm blanket  °

## 2020-08-07 DIAGNOSIS — I10 Essential (primary) hypertension: Secondary | ICD-10-CM

## 2020-08-07 DIAGNOSIS — R0789 Other chest pain: Secondary | ICD-10-CM

## 2020-08-07 DIAGNOSIS — R778 Other specified abnormalities of plasma proteins: Secondary | ICD-10-CM

## 2020-08-07 DIAGNOSIS — I472 Ventricular tachycardia: Secondary | ICD-10-CM

## 2020-08-07 LAB — MAGNESIUM: Magnesium: 2.5 mg/dL — ABNORMAL HIGH (ref 1.7–2.4)

## 2020-08-07 LAB — BASIC METABOLIC PANEL
Anion gap: 11 (ref 5–15)
BUN: 26 mg/dL — ABNORMAL HIGH (ref 8–23)
CO2: 24 mmol/L (ref 22–32)
Calcium: 9.3 mg/dL (ref 8.9–10.3)
Chloride: 106 mmol/L (ref 98–111)
Creatinine, Ser: 1.67 mg/dL — ABNORMAL HIGH (ref 0.61–1.24)
GFR, Estimated: 43 mL/min — ABNORMAL LOW (ref >60)
Glucose, Bld: 113 mg/dL — ABNORMAL HIGH (ref 70–99)
Potassium: 3.9 mmol/L (ref 3.5–5.1)
Sodium: 141 mmol/L (ref 135–145)

## 2020-08-07 LAB — LIPID PANEL
Cholesterol: 106 mg/dL (ref 0–200)
HDL: 26 mg/dL — ABNORMAL LOW (ref >40)
LDL Cholesterol: 66 mg/dL (ref 0–99)
Total CHOL/HDL Ratio: 4.1 RATIO
Triglycerides: 68 mg/dL (ref <150)
VLDL: 14 mg/dL (ref 0–40)

## 2020-08-07 LAB — GLUCOSE, CAPILLARY: Glucose-Capillary: 98 mg/dL (ref 70–99)

## 2020-08-07 MED ORDER — SODIUM CHLORIDE 0.9 % IV SOLN: 250.0000 mL | INTRAVENOUS | Status: DC | PRN

## 2020-08-07 MED ORDER — ASPIRIN 81 MG PO CHEW
81.0000 mg | CHEWABLE_TABLET | ORAL | Status: AC
  Administered 2020-08-08: 06:00:00 81 mg via ORAL
  Filled 2020-08-07: qty 1

## 2020-08-07 MED ORDER — POTASSIUM CHLORIDE CRYS ER 20 MEQ PO TBCR: 20.0000 meq | EXTENDED_RELEASE_TABLET | Freq: Two times a day (BID) | ORAL | Status: DC

## 2020-08-07 MED ORDER — SODIUM CHLORIDE 0.9% FLUSH
3.0000 mL | Freq: Two times a day (BID) | INTRAVENOUS | Status: DC
  Administered 2020-08-07 – 2020-08-09 (×6): 3 mL via INTRAVENOUS

## 2020-08-07 MED ORDER — ISOSORBIDE MONONITRATE ER 30 MG PO TB24
15.0000 mg | ORAL_TABLET | Freq: Every day | ORAL | Status: DC
  Administered 2020-08-07 – 2020-08-10 (×4): 15 mg via ORAL
  Filled 2020-08-07 (×4): qty 1

## 2020-08-07 MED ORDER — SODIUM CHLORIDE 0.9% FLUSH: 3.0000 mL | INTRAVENOUS | Status: DC | PRN

## 2020-08-07 MED ORDER — SODIUM CHLORIDE 0.9 % IV SOLN: INTRAVENOUS | Status: DC

## 2020-08-07 MED ORDER — FUROSEMIDE 10 MG/ML IJ SOLN: 80.0000 mg | Freq: Two times a day (BID) | INTRAMUSCULAR | Status: DC

## 2020-08-07 MED ORDER — HYDRALAZINE HCL 25 MG PO TABS
12.5000 mg | ORAL_TABLET | Freq: Three times a day (TID) | ORAL | Status: DC
  Administered 2020-08-07 – 2020-08-09 (×8): 12.5 mg via ORAL
  Filled 2020-08-07 (×8): qty 1

## 2020-08-07 NOTE — Progress Notes (Addendum)
[facility] PROGRESS NOTE    Progress Note  [name]  KNL:[id number] DOB: 1946-05-12 DOA: 08/06/2020 PCP: Linus Mako, NP     Brief Narrative:   [name] is an 74 y.o. male past medical history significant of ischemic cardiomyopathy with an EF of 25%, chronic kidney disease stage IIIa, PAD essential hypertension comes in with worsening shortness of breath and left-sided chest pain  Assessment/Plan:   Acute on chronic HFrEF (heart failure with reduced ejection fraction) ([place]): With an EF of 20%, due to nonischemic cardiomyopathy cath in 2007 showed nonobstructive disease. BNP greater than 4500, chest x-ray showed pulmonary edema serum creatinine 1.4 Cardiology was consulted he was started on IV Lasix and 2.5 metolazone x1. We will eventually need a right heart and left heart cardiac cath. Is currently on Entresto, Toprol, cardiology recommended try to add [place]. TSH 1.2.  Hemoglobin A1c of 6.1. Sleep study as an outpatient He is negative about 1700 cc increase in his creatinine, he still appears fluid overloaded on physical exam. Continue current dose of Lasix.  Chest pain with mild elevation of troponins: Chest pain is atypical and unlikely ischemic in the setting of chronic kidney disease stage III.  Troponins have remained flat. Due to multiple risk factor and frequent PVCs will need her left heart cath to rule out CAD.  Stage IIIa chronic kidney disease: With a baseline creatinine 1.4-1.5 Continue to monitor with diuresis.  Patient potassium is 3.9 will replete orally.  Monomorphic V. tach : Multiple episodes of what appears to be monomorphic V. tach on telemetry, he is currently on metoprolol. Further management per cardiology.  Essential hypertension: Currently on Entresto and Toprol Aldactone was added. Has been some improvement, will see if he continues to improve with IV diuresis.   DVT prophylaxis: lovenox Family  Communication:None Status is: Inpatient  Remains inpatient appropriate because:Hemodynamically unstable   Dispo: The patient is from: Home              Anticipated d/c is to: Home              Anticipated d/c date is: > 3 days              Patient currently is not medically stable to d/c.        Code Status:     Code Status Orders  (From admission, onward)         Start     Ordered   08/06/20 1319  Full code  Continuous        08/06/20 1321        Code Status History    Date Active Date Inactive Code Status Order ID Comments User Context   07/01/2020 0211 07/02/2020 2338 Full Code [id number]  Vianne Bulls, MD ED   Advance Care Planning Activity        IV Access:    Peripheral IV   Procedures and diagnostic studies:   DG Chest 2 View  Result Date: 08/06/2020 CLINICAL DATA:  Shortness of breath. EXAM: CHEST - 2 VIEW COMPARISON:  Chest radiograph and CTA 07/01/2020 FINDINGS: The cardiac silhouette remains mildly enlarged. Pulmonary vascular congestion and bilateral interstitial opacities are similar to the prior study. There may be trace pleural effusions. No pneumothorax is identified. An abdominal aortic stent graft is partially visualized. No acute osseous abnormality is seen. IMPRESSION: Cardiomegaly with bilateral lung opacities compatible with edema. Electronically Signed   By: Logan Bores M.D.   On: 08/06/2020 07:10  Medical Consultants:    None.  Anti-Infectives:   none  Subjective:    [name] relates his pain is improved.  Objective:    Vitals:   08/07/20 0013 08/07/20 0537 08/07/20 0721 08/07/20 0946  BP: 114/73 130/87 137/81 (!) 144/79  Pulse: 71 81 81 82  Resp: 20 18 18 16   Temp: 97.6 F (36.4 C) 98.4 F (36.9 C) 97.7 F (36.5 C) 98 F (36.7 C)  TempSrc: Oral Oral Oral Oral  SpO2: 93% 97% 98% 95%  Weight:  65 kg    Height:       SpO2: 95 % O2 Flow Rate (L/min): 1 L/min FiO2 (%): 28 %   Intake/Output Summary  (Last 24 hours) at 08/07/2020 1004 Last data filed at 08/07/2020 0639 Gross per 24 hour  Intake 346 ml  Output 2025 ml  Net -1679 ml   Filed Weights   08/06/20 0648 08/07/20 0537  Weight: 65 kg 65 kg    Exam: General exam: In no acute distress. Respiratory system: Good air movement and clear to auscultation. Cardiovascular system: S1 & S2 heard, RRR.  JVD to the earlobe Gastrointestinal system: Abdomen is nondistended, soft and nontender.  Extremities: No pedal edema. Skin: No rashes, lesions or ulcers Psychiatry: Judgement and insight appear normal. Mood & affect appropriate.   Data Reviewed:    Labs: Basic Metabolic Panel: Recent Labs  Lab 08/06/20 0655 08/06/20 1317 08/06/20 1630 08/07/20 0354  NA 143  --   --  141  K 4.3  --   --  3.9  CL 108  --   --  106  CO2 19*  --   --  24  GLUCOSE 172*  --   --  113*  BUN 18  --   --  26*  CREATININE 1.37* 1.42*  --  1.67*  CALCIUM 9.3  --   --  9.3  MG  --   --  1.6* 2.5*   GFR Estimated Creatinine Clearance: 35.7 mL/min (A) (by C-G formula based on SCr of 1.67 mg/dL (H)). Liver Function Tests: No results for input(s): AST, ALT, ALKPHOS, BILITOT, PROT, ALBUMIN in the last 168 hours. No results for input(s): LIPASE, AMYLASE in the last 168 hours. No results for input(s): AMMONIA in the last 168 hours. Coagulation profile Recent Labs  Lab 08/06/20 0655  INR 1.3*   COVID-19 Labs  No results for input(s): DDIMER, FERRITIN, LDH, CRP in the last 72 hours.  Lab Results  Component Value Date   SARSCOV2NAA NEGATIVE 08/06/2020   [place] NEGATIVE 06/30/2020    CBC: Recent Labs  Lab 08/06/20 0655 08/06/20 1317  WBC 7.3 6.5  HGB 12.7* 13.0  HCT 39.7 38.9*  MCV 100.3* 100.5*  PLT 144* 63*   Cardiac Enzymes: No results for input(s): CKTOTAL, CKMB, CKMBINDEX, TROPONINI in the last 168 hours. BNP (last 3 results) No results for input(s): PROBNP in the last 8760 hours. CBG: Recent Labs  Lab 08/06/20 2107  08/07/20 0633  GLUCAP 132* 98   D-Dimer: No results for input(s): DDIMER in the last 72 hours. Hgb A1c: Recent Labs    08/06/20 1317  HGBA1C 6.1*   Lipid Profile: Recent Labs    08/07/20 0352  CHOL 106  HDL 26*  LDLCALC 66  TRIG 68  CHOLHDL 4.1   Thyroid function studies: Recent Labs    08/06/20 1630  TSH 1.224   Anemia work up: No results for input(s): VITAMINB12, FOLATE, FERRITIN, TIBC, IRON, RETICCTPCT in the  last 72 hours. Sepsis Labs: Recent Labs  Lab 08/06/20 0655 08/06/20 1317 08/06/20 2026 08/06/20 2236  WBC 7.3 6.5  --   --   LATICACIDVEN  --   --  2.9* 1.8   Microbiology Recent Results (from the past 240 hour(s))  Resp Panel by RT-PCR (Flu A&B, Covid) Nasopharyngeal Swab     Status: None   Collection Time: 08/06/20  9:56 AM   Specimen: Nasopharyngeal Swab; Nasopharyngeal(NP) swabs in vial transport medium  Result Value Ref Range Status   SARS Coronavirus 2 by RT PCR NEGATIVE NEGATIVE Final    Comment: (NOTE) SARS-CoV-2 target nucleic acids are NOT DETECTED.  The SARS-CoV-2 RNA is generally detectable in upper respiratory specimens during the acute phase of infection. The lowest concentration of SARS-CoV-2 viral copies this assay can detect is 138 copies/mL. A negative result does not preclude SARS-Cov-2 infection and should not be used as the sole basis for treatment or other patient management decisions. A negative result may occur with  improper specimen collection/handling, submission of specimen other than nasopharyngeal swab, presence of viral mutation(s) within the areas targeted by this assay, and inadequate number of viral copies(<138 copies/mL). A negative result must be combined with clinical observations, patient history, and epidemiological information. The expected result is Negative.  Fact Sheet for Patients:  EntrepreneurPulse.com.au  Fact Sheet for Healthcare Providers:   IncredibleEmployment.be  This test is no t yet approved or cleared by the [place] FDA and  has been authorized for detection and/or diagnosis of SARS-CoV-2 by FDA under an Emergency Use Authorization (EUA). This EUA will remain  in effect (meaning this test can be used) for the duration of the COVID-19 declaration under Section 564(b)(1) of the Act, 21 U.S.C.section 360bbb-3(b)(1), unless the authorization is terminated  or revoked sooner.       Influenza A by PCR NEGATIVE NEGATIVE Final   Influenza B by PCR NEGATIVE NEGATIVE Final    Comment: (NOTE) The Xpert Xpress SARS-CoV-2/FLU/RSV plus assay is intended as an aid in the diagnosis of influenza from Nasopharyngeal swab specimens and should not be used as a sole basis for treatment. Nasal washings and aspirates are unacceptable for Xpert Xpress SARS-CoV-2/FLU/RSV testing.  Fact Sheet for Patients: EntrepreneurPulse.com.au  Fact Sheet for Healthcare Providers: IncredibleEmployment.be  This test is not yet approved or cleared by the [place] FDA and has been authorized for detection and/or diagnosis of SARS-CoV-2 by FDA under an Emergency Use Authorization (EUA). This EUA will remain in effect (meaning this test can be used) for the duration of the COVID-19 declaration under Section 564(b)(1) of the Act, 21 U.S.C. section 360bbb-3(b)(1), unless the authorization is terminated or revoked.  Performed at [facility], [place] [address] [place], [place] [address]      Medications:   . aspirin EC  81 mg Oral Daily  . cholecalciferol  1,000 Units Oral BID  . enoxaparin (LOVENOX) injection  40 mg Subcutaneous Q24H  . furosemide  80 mg Intravenous BID  . insulin aspart  0-5 Units Subcutaneous QHS  . insulin aspart  0-9 Units Subcutaneous TID WC  . metoprolol succinate  50 mg Oral Daily  . sacubitril-valsartan  1 tablet Oral BID  . sodium chloride flush  3  mL Intravenous Q12H  . spironolactone  12.5 mg Oral Daily   Continuous Infusions: . sodium chloride        LOS: 1 day   Charlynne Cousins  [facility]  08/07/2020, 10:04 AM

## 2020-08-07 NOTE — Evaluation (Signed)
Occupational Therapy Evaluation and Discharge Patient Details Name: [name] MRN: [id number] DOB: [birth year] Today's Date: 08/07/2020    History of Present Illness [name] is an 74 y.o. male past medical history significant of ischemic cardiomyopathy with an EF of 25%, chronic kidney disease stage IIIa, PAD essential hypertension comes in with worsening shortness of breath and left-sided chest pain. Admitted with acute on chronic HFrEF.   Clinical Impression   Pt is functioning independently in ADL and ADL transfers. Educated in energy conservation, pt receptive. No further OT needs.     Follow Up Recommendations  No OT follow up    Equipment Recommendations  None recommended by OT    Recommendations for Other Services       Precautions / Restrictions Precautions Precautions: None Restrictions Weight Bearing Restrictions: No      Mobility Bed Mobility Pt in chair.                  Transfers Overall transfer level: Independent Equipment used: None                  Balance Overall balance assessment: No apparent balance deficits (not formally assessed)                                         ADL either performed or assessed with clinical judgement   ADL Overall ADL's : Independent                                       General ADL Comments: Educated pt in energy conservation strategies and reinforced with written handout.     Vision Baseline Vision/History: Wears glasses Wears Glasses: Reading only Patient Visual Report: No change from baseline       Perception     Praxis      Pertinent Vitals/Pain Pain Assessment: No/denies pain     Hand Dominance Right   Extremity/Trunk Assessment Upper Extremity Assessment Upper Extremity Assessment: Overall WFL for tasks assessed   Lower Extremity Assessment Lower Extremity Assessment: Overall WFL for tasks assessed   Cervical / Trunk  Assessment Cervical / Trunk Assessment: Normal   Communication Communication Communication: No difficulties   Cognition Arousal/Alertness: Awake/alert Behavior During Therapy: WFL for tasks assessed/performed Overall Cognitive Status: Within Functional Limits for tasks assessed                                     General Comments       Exercises     Shoulder Instructions      Home Living Family/patient expects to be discharged to:: Private residence Living Arrangements: Alone Available Help at Discharge: Family;Available PRN/intermittently Type of Home: House Home Access: Stairs to enter [employer] of Steps: 2   Home Layout: One level     Bathroom Shower/Tub: Teacher, early years/pre: Standard     Home Equipment: None          Prior Functioning/Environment Level of Independence: Independent        Comments: Works part time in Nurse, 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        OT Problem List:        OT Treatment/Interventions:      OT Goals(Current goals can  be found in the care plan section) Acute Rehab OT Goals Patient Stated Goal: return to work  OT Frequency:     Barriers to D/C:            Co-evaluation              AM-PAC OT "6 Clicks" Daily Activity     Outcome Measure Help from another person eating meals?: None Help from another person taking care of personal grooming?: None Help from another person toileting, which includes using toliet, bedpan, or urinal?: None Help from another person bathing (including washing, rinsing, drying)?: None Help from another person to put on and taking off regular upper body clothing?: None Help from another person to put on and taking off regular lower body clothing?: None 6 Click Score: 24   End of Session    Activity Tolerance: Patient tolerated treatment well Patient left: in chair;with call bell/phone within reach  OT Visit Diagnosis: Other (comment) (decreased activity  tolerance)                Time: 6606-0045 OT Time Calculation (min): 12 min Charges:  OT General Charges $OT Visit: 1 Visit OT Evaluation $OT Eval Low Complexity: 1 Low  Nestor Lewandowsky, OTR/L Acute Rehabilitation Services Pager: [phone] Office: [phone]  Malka So 08/07/2020, 1:53 PM

## 2020-08-07 NOTE — Evaluation (Signed)
Physical Therapy Evaluation and Discharge Patient Details Name: [name] MRN: [id number] DOB: January 23, 1946 Today's Date: 08/07/2020   History of Present Illness  [name] is an 74 y.o. male past medical history significant of ischemic cardiomyopathy with an EF of 25%, chronic kidney disease stage IIIa, PAD essential hypertension comes in with worsening shortness of breath and left-sided chest pain. Admitted with acute on chronic HFrEF.  Clinical Impression  Patient evaluated by Physical Therapy with no further acute PT needs identified. Prior to admission, pt lives alone and is fiercely independent; he still works in [employer]. Pt reports feeling fairly close to his functional baseline. Ambulating hallway distances with no assistive device and negotiated 3 steps without difficulty. HR peak 105 bpm, SpO2 96% on RA. Pt denied dyspnea on exertion. All education has been completed and the patient has no further questions. No follow-up Physical Therapy or equipment needs. PT is signing off. Thank you for this referral.     Follow Up Recommendations No PT follow up    Equipment Recommendations  None recommended by PT    Recommendations for Other Services       Precautions / Restrictions Precautions Precautions: None Restrictions Weight Bearing Restrictions: No      Mobility  Bed Mobility Overal bed mobility: Independent                  Transfers Overall transfer level: Independent Equipment used: None                Ambulation/Gait Ambulation/Gait assistance: Independent Gait Distance (Feet): 350 Feet Assistive device: None Gait Pattern/deviations: WFL(Within Functional Limits)     General Gait Details: Good speed, posture, no evidence of gross instability  Stairs Stairs: Yes Stairs assistance: Modified independent (Device/Increase time) Stair Management: One rail Right Number of Stairs: 3 General stair comments: step over step  pattern  Wheelchair Mobility    Modified Rankin (Stroke Patients Only)       Balance Overall balance assessment: No apparent balance deficits (not formally assessed)                                           Pertinent Vitals/Pain Pain Assessment: No/denies pain    Home Living Family/patient expects to be discharged to:: Private residence Living Arrangements: Alone Available Help at Discharge: Family;Available PRN/intermittently Type of Home: House Home Access: Stairs to enter   [employer] of Steps: 2 Home Layout: One level Home Equipment: None      Prior Function Level of Independence: Independent         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        Extremity/Trunk Assessment   Upper Extremity Assessment Upper Extremity Assessment: Overall WFL for tasks assessed    Lower Extremity Assessment Lower Extremity Assessment: Overall WFL for tasks assessed    Cervical / Trunk Assessment Cervical / Trunk Assessment: Normal  Communication   Communication: No difficulties  Cognition Arousal/Alertness: Awake/alert Behavior During Therapy: WFL for tasks assessed/performed Overall Cognitive Status: Within Functional Limits for tasks assessed                                        General Comments      Exercises     Assessment/Plan  PT Assessment Patent does not need any further PT services  PT Problem List         PT Treatment Interventions      PT Goals (Current goals can be found in the Care Plan section)  Acute Rehab PT Goals Patient Stated Goal: return to work PT Goal Formulation: All assessment and education complete, DC therapy    Frequency     Barriers to discharge        Co-evaluation               AM-PAC PT "6 Clicks" Mobility  Outcome Measure Help needed turning from your back to your side while in a flat bed without using bedrails?: None Help  needed moving from lying on your back to sitting on the side of a flat bed without using bedrails?: None Help needed moving to and from a bed to a chair (including a wheelchair)?: None Help needed standing up from a chair using your arms (e.g., wheelchair or bedside chair)?: None Help needed to walk in hospital room?: None Help needed climbing 3-5 steps with a railing? : None 6 Click Score: 24    End of Session   Activity Tolerance: Patient tolerated treatment well Patient left: in chair;with call bell/phone within reach Nurse Communication: Mobility status PT Visit Diagnosis: Difficulty in walking, not elsewhere classified (R26.2)    Time: 2778-2423 PT Time Calculation (min) (ACUTE ONLY): 20 min   Charges:   PT Evaluation $PT Eval Low Complexity: [place], PT, DPT Acute Rehabilitation Services Pager [phone] Office [phone]   Deno Etienne 08/07/2020, 12:27 PM

## 2020-08-07 NOTE — Progress Notes (Signed)
Pt ambulated earlier with PT and sat in recliner for a while. Now just lied down to rest. Declines walking and education. SaO2 96 RA lying. Has HF booklet. Will f/u at another time. Encouraged pt to ambulate independently. [place], ACSM 2:47 PM 08/07/2020

## 2020-08-07 NOTE — Progress Notes (Addendum)
Advanced Heart Failure Rounding Note  PCP-Cardiologist: No primary care provider on file.   Subjective:    Yesterday diuresed with IV lasix.  Negative 1.6 liters. Creatinine trending up 1.4>1.7.   Denies SOB. Feeling better.    Objective:   Weight Range: 65 kg Body mass index is 20 kg/m.   Vital Signs:   Temp:  [97.3 F (36.3 C)-98.5 F (36.9 C)] 97.7 F (36.5 C) (12/15 1205) Pulse Rate:  [62-88] 81 (12/15 1205) Resp:  [14-26] 20 (12/15 1205) BP: (107-155)/(66-103) 132/96 (12/15 1205) SpO2:  [91 %-100 %] 98 % (12/15 1205) FiO2 (%):  [28 %] 28 % (12/14 1317) Weight:  [65 kg] 65 kg (12/15 0537) Last BM Date: 08/05/20  Weight change: Filed Weights   08/06/20 0648 08/07/20 0537  Weight: 65 kg 65 kg    Intake/Output:   Intake/Output Summary (Last 24 hours) at 08/07/2020 1236 Last data filed at 08/07/2020 1049 Gross per 24 hour  Intake 466 ml  Output 2425 ml  Net -1959 ml      Physical Exam    General:  Sitting in the chair. No resp difficulty HEENT: Normal Neck: Supple. JVP 5-6. Carotids 2+ bilat; no bruits. No lymphadenopathy or thyromegaly appreciated. Cor: PMI nondisplaced. Regular rate & rhythm. No rubs, gallops or murmurs. Lungs: Clear Abdomen: Soft, nontender, nondistended. No hepatosplenomegaly. No bruits or masses. Good bowel sounds. Extremities: No cyanosis, clubbing, rash, edema Neuro: Alert & orientedx3, cranial nerves grossly intact. moves all 4 extremities w/o difficulty. Affect pleasant   Telemetry   SR with PVCs 80s RBBB  EKG    N/A  Labs    CBC Recent Labs    08/06/20 0655 08/06/20 1317  WBC 7.3 6.5  HGB 12.7* 13.0  HCT 39.7 38.9*  MCV 100.3* 100.5*  PLT 144* 63*   Basic Metabolic Panel Recent Labs    08/06/20 0655 08/06/20 1317 08/06/20 1630 08/07/20 0354  NA 143  --   --  141  K 4.3  --   --  3.9  CL 108  --   --  106  CO2 19*  --   --  24  GLUCOSE 172*  --   --  113*  BUN 18  --   --  26*  CREATININE  1.37* 1.42*  --  1.67*  CALCIUM 9.3  --   --  9.3  MG  --   --  1.6* 2.5*   Liver Function Tests No results for input(s): AST, ALT, ALKPHOS, BILITOT, PROT, ALBUMIN in the last 72 hours. No results for input(s): LIPASE, AMYLASE in the last 72 hours. Cardiac Enzymes No results for input(s): CKTOTAL, CKMB, CKMBINDEX, TROPONINI in the last 72 hours.  BNP: BNP (last 3 results) Recent Labs    06/30/20 2354 08/06/20 0941  BNP 3,205.6* >4,500.0*    ProBNP (last 3 results) No results for input(s): PROBNP in the last 8760 hours.   D-Dimer No results for input(s): DDIMER in the last 72 hours. Hemoglobin A1C Recent Labs    08/06/20 1317  HGBA1C 6.1*   Fasting Lipid Panel Recent Labs    08/07/20 0352  CHOL 106  HDL 26*  LDLCALC 66  TRIG 68  CHOLHDL 4.1   Thyroid Function Tests Recent Labs    08/06/20 1630  TSH 1.224    Other results:   Imaging     No results found.   Medications:     Scheduled Medications: . aspirin EC  81 mg Oral  Daily  . cholecalciferol  1,000 Units Oral BID  . enoxaparin (LOVENOX) injection  40 mg Subcutaneous Q24H  . furosemide  80 mg Intravenous BID  . metoprolol succinate  50 mg Oral Daily  . sacubitril-valsartan  1 tablet Oral BID  . sodium chloride flush  3 mL Intravenous Q12H  . spironolactone  12.5 mg Oral Daily     Infusions: . sodium chloride       PRN Medications:  sodium chloride, acetaminophen, ondansetron (ZOFRAN) IV, sodium chloride flush    Patient Profile  *74 y/o male with HTN, PAD. CKD 3a and chronic systolic HF due to presumed NICM EF 25%.   Admitted with ADHF.   Assessment/Plan    1. 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 Reported as NICM (notes indicate nonobstructive CAD on cath in 2007, study report not on file) - Most recent echo 11/21 LVEF 20-25%, G2DD, diffuse HK, moderately reduced  RV - This is 2nd admit for a/c CHF in 6 weeks.  - BNP >4,500. CXR w/ pulmonary edema.  -Will need cMRI.  - Volume status stable. Stop IV lasix. Set up for cath tomorrow.  - Continue Entresto 97-103 - Continue Toprol XL 50 mg daily  - Hold off on SGLT2i, Spiro +/- digoxin  - Add hydralazine 12.5 mg three times a day + 15 mg imdur daily.  - May need eventual ICD  - Monitor PVC burden on tele (may be contributing to CMP)  - Plan outpatient sleep study  - Consider CardioMEMS Implant   2. Chest Pain w/ Mildly Elevated Troponin  - CP atypical and not c/w ischemic CP  - Hs trop flat and low level, 53>>58, likely demand ischemia from a/c CHF  3. Stage IIIa CKD - Baseline SCr ~1.4-1.5 - 1.42 on admit-->1.7 - Consider SGT2i at d/c    4. PVCs  -Frequent on tele, in setting of LVEF <35% - Continue Toprol 50 mg daily - May need AAD therapy to reduce burden   - Check TSH - Keep K > 4.0 and Mg >2.0 - Eventual Cath to r/o ischemia  - Outpatient sleep study to r/o OSA - W/ chronically low EF, will need EP evaluation for potential ICD   5. HTN:  - Continue Entresto + Toprol -  Add hydralazine 12.5 mg three times a day + 15 mg imdur.   6. PAD - s/p bifem bypass and aorto-iliac aneursym repair at [name]  - denies claudication  - continue ASA  - Not on statin. Check FLP in AM   7. Pulmonary Nodules - bilateral pulmonary nodules noted on chest CT 11/21 measuring up to approximately 7 mm - Non-contrast chest CT at 3-6 months is recommended - smoking cessation advised   Will need CMRI. Possible RHC/LHC.   Length of Stay: 1  Amy Clegg, NP  08/07/2020, 12:36 PM  Advanced Heart Failure Team Pager [phone] (M-F; 7a - 4p)  Please contact [facility] for night-coverage after hours (4p -7a ) and weekends on [email or web address]  Patient seen and examined with the above-signed Advanced Practice Provider and/or Housestaff. I personally reviewed laboratory data, imaging studies and  relevant notes. I independently examined the patient and formulated the important aspects of the plan. I have edited the note to reflect any of my changes or salient points. I have personally discussed the plan with the patient and/or family.  Diuresed well. Says breathing  is better. No orthopnea or PND. Denies CP.   General:  Elderly male lying flat in bed No resp difficulty HEENT: normal x for opacified R lens Neck: supple. JVP 10. Carotids 2+ bilat; no bruits. No lymphadenopathy or thryomegaly appreciated. Cor: PMI nondisplaced. Regular rate & rhythm. No rubs, gallops or murmurs. Lungs: clear Abdomen: soft, nontender, nondistended. No hepatosplenomegaly. No bruits or masses. Good bowel sounds. Extremities: no cyanosis, clubbing, rash, edema Neuro: alert & orientedx3, cranial nerves grossly intact. moves all 4 extremities w/o difficulty. Affect pleasant  He has diuresed well and breathing better but neck veins still up. Creatinine starting to bump. Will hold diuretics. Plan R/L cath tomorrow at !2 noon. Discussed with him and his daughter in detail.   Glori Bickers, MD  7:24 PM

## 2020-08-08 ENCOUNTER — Encounter (HOSPITAL_COMMUNITY)
Admission: EM | Disposition: A | Discharge status: Home / Self Care | Payer: Self-pay | Source: Home / Self Care | Attending: Internal Medicine

## 2020-08-08 ENCOUNTER — Encounter (HOSPITAL_COMMUNITY): Payer: Self-pay | Admitting: Internal Medicine

## 2020-08-08 DIAGNOSIS — N1831 Chronic kidney disease, stage 3a: Secondary | ICD-10-CM

## 2020-08-08 DIAGNOSIS — I2510 Atherosclerotic heart disease of native coronary artery without angina pectoris: Secondary | ICD-10-CM

## 2020-08-08 DIAGNOSIS — I493 Ventricular premature depolarization: Secondary | ICD-10-CM

## 2020-08-08 HISTORY — PX: RIGHT/LEFT HEART CATH AND CORONARY ANGIOGRAPHY: CATH118266

## 2020-08-08 LAB — POCT I-STAT EG7
Acid-Base Excess: 1 mmol/L (ref 0.0–2.0)
Acid-Base Excess: 3 mmol/L — ABNORMAL HIGH (ref 0.0–2.0)
Bicarbonate: 25.2 mmol/L (ref 20.0–28.0)
Bicarbonate: 27.1 mmol/L (ref 20.0–28.0)
Calcium, Ion: 1.03 mmol/L — ABNORMAL LOW (ref 1.15–1.40)
Calcium, Ion: 1.25 mmol/L (ref 1.15–1.40)
HCT: 35 % — ABNORMAL LOW (ref 39.0–52.0)
HCT: 38 % — ABNORMAL LOW (ref 39.0–52.0)
Hemoglobin: 11.9 g/dL — ABNORMAL LOW (ref 13.0–17.0)
Hemoglobin: 12.9 g/dL — ABNORMAL LOW (ref 13.0–17.0)
O2 Saturation: 57 %
O2 Saturation: 57 %
Potassium: 2.8 mmol/L — ABNORMAL LOW (ref 3.5–5.1)
Potassium: 3.3 mmol/L — ABNORMAL LOW (ref 3.5–5.1)
Sodium: 140 mmol/L (ref 135–145)
Sodium: 144 mmol/L (ref 135–145)
TCO2: 26 mmol/L (ref 22–32)
TCO2: 28 mmol/L (ref 22–32)
pCO2, Ven: 38.1 mmHg — ABNORMAL LOW (ref 44.0–60.0)
pCO2, Ven: 40.9 mmHg — ABNORMAL LOW (ref 44.0–60.0)
pH, Ven: 7.428 (ref 7.250–7.430)
pH, Ven: 7.43 (ref 7.250–7.430)
pO2, Ven: 29 mmHg — CL (ref 32.0–45.0)
pO2, Ven: 29 mmHg — CL (ref 32.0–45.0)

## 2020-08-08 LAB — BASIC METABOLIC PANEL
Anion gap: 11 (ref 5–15)
Anion gap: 11 (ref 5–15)
BUN: 31 mg/dL — ABNORMAL HIGH (ref 8–23)
BUN: 31 mg/dL — ABNORMAL HIGH (ref 8–23)
CO2: 26 mmol/L (ref 22–32)
CO2: 25 mmol/L (ref 22–32)
Calcium: 9 mg/dL (ref 8.9–10.3)
Calcium: 8.8 mg/dL — ABNORMAL LOW (ref 8.9–10.3)
Chloride: 99 mmol/L (ref 98–111)
Chloride: 101 mmol/L (ref 98–111)
Creatinine, Ser: 1.62 mg/dL — ABNORMAL HIGH (ref 0.61–1.24)
Creatinine, Ser: 1.64 mg/dL — ABNORMAL HIGH (ref 0.61–1.24)
GFR, Estimated: 44 mL/min — ABNORMAL LOW (ref >60)
GFR, Estimated: 44 mL/min — ABNORMAL LOW (ref >60)
Glucose, Bld: 98 mg/dL (ref 70–99)
Glucose, Bld: 114 mg/dL — ABNORMAL HIGH (ref 70–99)
Potassium: 3.6 mmol/L (ref 3.5–5.1)
Potassium: 3.8 mmol/L (ref 3.5–5.1)
Sodium: 136 mmol/L (ref 135–145)
Sodium: 137 mmol/L (ref 135–145)

## 2020-08-08 LAB — POCT I-STAT 7, (LYTES, BLD GAS, ICA,H+H)
Acid-Base Excess: 2 mmol/L (ref 0.0–2.0)
Bicarbonate: 25.5 mmol/L (ref 20.0–28.0)
Calcium, Ion: 1.22 mmol/L (ref 1.15–1.40)
HCT: 38 % — ABNORMAL LOW (ref 39.0–52.0)
Hemoglobin: 12.9 g/dL — ABNORMAL LOW (ref 13.0–17.0)
O2 Saturation: 98 %
Potassium: 3.4 mmol/L — ABNORMAL LOW (ref 3.5–5.1)
Sodium: 140 mmol/L (ref 135–145)
TCO2: 26 mmol/L (ref 22–32)
pCO2 arterial: 33.5 mmHg (ref 32.0–48.0)
pH, Arterial: 7.489 — ABNORMAL HIGH (ref 7.350–7.450)
pO2, Arterial: 97 mmHg (ref 83.0–108.0)

## 2020-08-08 LAB — CBC
HCT: 35.1 % — ABNORMAL LOW (ref 39.0–52.0)
Hemoglobin: 12.3 g/dL — ABNORMAL LOW (ref 13.0–17.0)
MCH: 33.2 pg (ref 26.0–34.0)
MCHC: 35 g/dL (ref 30.0–36.0)
MCV: 94.9 fL (ref 80.0–100.0)
Platelets: 153 10*3/uL (ref 150–400)
RBC: 3.7 MIL/uL — ABNORMAL LOW (ref 4.22–5.81)
RDW: 14.9 % (ref 11.5–15.5)
WBC: 8.3 10*3/uL (ref 4.0–10.5)
nRBC: 0 % (ref 0.0–0.2)

## 2020-08-08 SURGERY — RIGHT/LEFT HEART CATH AND CORONARY ANGIOGRAPHY
Anesthesia: LOCAL

## 2020-08-08 MED ORDER — SPIRONOLACTONE 25 MG PO TABS
25.0000 mg | ORAL_TABLET | Freq: Every day | ORAL | Status: DC
  Administered 2020-08-09 – 2020-08-10 (×2): 25 mg via ORAL
  Filled 2020-08-08 (×2): qty 1

## 2020-08-08 MED ORDER — ACETAMINOPHEN 325 MG PO TABS: 650.0000 mg | ORAL_TABLET | ORAL | Status: DC | PRN

## 2020-08-08 MED ORDER — VERAPAMIL HCL 2.5 MG/ML IV SOLN
INTRAVENOUS | Status: DC | PRN
  Administered 2020-08-08: 12:00:00 10 mL via INTRA_ARTERIAL

## 2020-08-08 MED ORDER — HEPARIN (PORCINE) IN NACL 1000-0.9 UT/500ML-% IV SOLN
INTRAVENOUS | Status: AC
  Filled 2020-08-08: qty 1000

## 2020-08-08 MED ORDER — ENOXAPARIN SODIUM 40 MG/0.4ML ~~LOC~~ SOLN
40.0000 mg | SUBCUTANEOUS | Status: DC
  Administered 2020-08-09 – 2020-08-10 (×2): 40 mg via SUBCUTANEOUS
  Filled 2020-08-08 (×2): qty 0.4

## 2020-08-08 MED ORDER — LIDOCAINE HCL (PF) 1 % IJ SOLN
INTRAMUSCULAR | Status: DC | PRN
  Administered 2020-08-08 (×2): 2 mL

## 2020-08-08 MED ORDER — ONDANSETRON HCL 4 MG/2ML IJ SOLN: 4.0000 mg | Freq: Four times a day (QID) | INTRAMUSCULAR | Status: DC | PRN

## 2020-08-08 MED ORDER — SODIUM CHLORIDE 0.9% FLUSH: 3.0000 mL | INTRAVENOUS | Status: DC | PRN

## 2020-08-08 MED ORDER — HYDRALAZINE HCL 20 MG/ML IJ SOLN: 10.0000 mg | INTRAMUSCULAR | Status: AC | PRN

## 2020-08-08 MED ORDER — MIDAZOLAM HCL 2 MG/2ML IJ SOLN
INTRAMUSCULAR | Status: DC | PRN
  Administered 2020-08-08: 1 mg via INTRAVENOUS

## 2020-08-08 MED ORDER — SODIUM CHLORIDE 0.9% FLUSH
3.0000 mL | Freq: Two times a day (BID) | INTRAVENOUS | Status: DC
  Administered 2020-08-08 – 2020-08-09 (×3): 3 mL via INTRAVENOUS

## 2020-08-08 MED ORDER — SODIUM CHLORIDE 0.9 % IV SOLN: INTRAVENOUS | Status: AC

## 2020-08-08 MED ORDER — LIDOCAINE HCL (PF) 1 % IJ SOLN
INTRAMUSCULAR | Status: AC
  Filled 2020-08-08: qty 30

## 2020-08-08 MED ORDER — HEPARIN SODIUM (PORCINE) 1000 UNIT/ML IJ SOLN
INTRAMUSCULAR | Status: DC | PRN
  Administered 2020-08-08: 3000 [IU] via INTRAVENOUS

## 2020-08-08 MED ORDER — HEPARIN (PORCINE) IN NACL 1000-0.9 UT/500ML-% IV SOLN
INTRAVENOUS | Status: DC | PRN
  Administered 2020-08-08 (×2): 500 mL

## 2020-08-08 MED ORDER — FENTANYL CITRATE (PF) 100 MCG/2ML IJ SOLN
INTRAMUSCULAR | Status: DC | PRN
  Administered 2020-08-08: 25 ug via INTRAVENOUS

## 2020-08-08 MED ORDER — SPIRONOLACTONE 12.5 MG HALF TABLET
12.5000 mg | ORAL_TABLET | Freq: Once | ORAL | Status: AC
  Administered 2020-08-08: 15:00:00 12.5 mg via ORAL
  Filled 2020-08-08: qty 1

## 2020-08-08 MED ORDER — VERAPAMIL HCL 2.5 MG/ML IV SOLN
INTRAVENOUS | Status: AC
  Filled 2020-08-08: qty 2

## 2020-08-08 MED ORDER — HEPARIN SODIUM (PORCINE) 1000 UNIT/ML IJ SOLN
INTRAMUSCULAR | Status: AC
  Filled 2020-08-08: qty 1

## 2020-08-08 MED ORDER — FENTANYL CITRATE (PF) 100 MCG/2ML IJ SOLN
INTRAMUSCULAR | Status: AC
  Filled 2020-08-08: qty 2

## 2020-08-08 MED ORDER — SODIUM CHLORIDE 0.9 % IV SOLN: 250.0000 mL | INTRAVENOUS | Status: DC | PRN

## 2020-08-08 MED ORDER — LABETALOL HCL 5 MG/ML IV SOLN: 10.0000 mg | INTRAVENOUS | Status: AC | PRN

## 2020-08-08 MED ORDER — IOHEXOL 350 MG/ML SOLN
INTRAVENOUS | Status: DC | PRN
  Administered 2020-08-08: 12:00:00 35 mL

## 2020-08-08 MED ORDER — MIDAZOLAM HCL 2 MG/2ML IJ SOLN
INTRAMUSCULAR | Status: AC
  Filled 2020-08-08: qty 2

## 2020-08-08 SURGICAL SUPPLY — 12 items
CATH 5FR JL3.5 JR4 ANG PIG MP (CATHETERS) ×1 IMPLANT
CATH BALLN WEDGE 5F 110CM (CATHETERS) ×1 IMPLANT
CATH INFINITI 5FR JL4 (CATHETERS) ×1 IMPLANT
DEVICE RAD COMP TR BAND LRG (VASCULAR PRODUCTS) ×1 IMPLANT
GLIDESHEATH SLEND SS 6F .021 (SHEATH) ×1 IMPLANT
GUIDEWIRE .025 260CM (WIRE) ×1 IMPLANT
GUIDEWIRE INQWIRE 1.5J.035X260 (WIRE) IMPLANT
INQWIRE 1.5J .035X260CM (WIRE) ×2
KIT HEART LEFT (KITS) ×1 IMPLANT
PACK CARDIAC CATHETERIZATION (CUSTOM PROCEDURE TRAY) ×2 IMPLANT
SHEATH GLIDE SLENDER 4/5FR (SHEATH) ×1 IMPLANT
TRANSDUCER W/STOPCOCK (MISCELLANEOUS) ×2 IMPLANT

## 2020-08-08 NOTE — Progress Notes (Signed)
[facility] PROGRESS NOTE    Progress Note  [name]  WUX:[id number] DOB: 1946/04/16 DOA: 08/06/2020 PCP: Linus Mako, NP     Brief Narrative:   [name] is an 74 y.o. male past medical history significant of ischemic cardiomyopathy with an EF of 25%, chronic kidney disease stage IIIa, PAD essential hypertension comes in with worsening shortness of breath and left-sided chest pain  Assessment/Plan:   Acute on chronic HFrEF (heart failure with reduced ejection fraction) ([place]): With an EF of 20%, due to nonischemic cardiomyopathy cath in 2007 showed nonobstructive disease. BNP greater than 4500, chest x-ray showed pulmonary edema serum creatinine 1.4 Given his heart failure team was consulted no of Lasix due to increasing renal function. Left and right heart cath scheduled for 08/08/2020 that showed moderate nonobstructive CAD, severe nonischemic cardiomyopathy with an EF around 15%. Cardiac MRI  cardiac MRI recommended by cardiology. Is currently on Entresto, Toprol, cardiology recommended try to add Farxiga. -1700 cc.  Chest pain with mild elevation of troponins: Chest pain is atypical and unlikely ischemic in the setting of chronic kidney disease stage III.  Troponins have remained flat.  Stage IIIa chronic kidney disease: With a baseline creatinine 1.4-1.5 Mild increase in his creatinine, holding diuretics for today. Recheck basic metabolic panel in the morning.  PVCs/possibly monomorphic V. tach : Multiple episodes of what appears to be monomorphic V. tach on telemetry, he is currently on metoprolol. To consult EP for further evaluation  Essential hypertension: Currently on Entresto Toprol and Aldactone.. Blood pressure is improved.   DVT prophylaxis: lovenox Family Communication:None Status is: Inpatient  Remains inpatient appropriate because:Hemodynamically unstable   Dispo: The patient is from: Home              Anticipated d/c is to:  Home              Anticipated d/c date is: > 3 days              Patient currently is not medically stable to d/c.        Code Status:     Code Status Orders  (From admission, onward)         Start     Ordered   08/06/20 1319  Full code  Continuous        08/06/20 1321        Code Status History    Date Active Date Inactive Code Status Order ID Comments User Context   07/01/2020 0211 07/02/2020 2338 Full Code [id number]  Vianne Bulls, MD ED   Advance Care Planning Activity        IV Access:    Peripheral IV   Procedures and diagnostic studies:   CARDIAC CATHETERIZATION  Result Date: 08/08/2020  Prox RCA to Mid RCA lesion is 50% stenosed.  Ost RCA to Prox RCA lesion is 30% stenosed.  Dist RCA lesion is 30% stenosed.  Prox Cx lesion is 30% stenosed.  Mid Cx to Dist Cx lesion is 30% stenosed.  Prox LAD lesion is 30% stenosed.  Mid LAD lesion is 40% stenosed.  2nd Diag-1 lesion is 50% stenosed.  2nd Diag-2 lesion is 70% stenosed.  Findings: Ao = 110/67 (85) LV = 109/20 RA = 8 RV = 49/14 PA = 51/25 (37) PCW = 25 (v = 31) Fick cardiac output/index = 3.6/1.9 PVR = 3.4 WU SVR = 1736 FA sat = 98% PA sat = 57%. 57% Assessment: 1. Moderate non-obstructive CAD 2. Severe  NICM EF 15% 3. Elevated filling pressures with low output Plan/Discussion: He has severe LV dysfunction due to NICM. Will get cMRI to further evaluate. If renal function stable post cat will resume diuresis as tolerated. May need milrinone support to diurese further. Glori Bickers, MD 12:30 PM     Medical Consultants:    None.  Anti-Infectives:   none  Subjective:    [name] no further chest pain.  Objective:    Vitals:   08/08/20 1252 08/08/20 1300 08/08/20 1315 08/08/20 1335  BP: 121/83 117/81 106/75 107/83  Pulse: (!) 59 62 60 63  Resp: 16     Temp: (!) 97.5 F (36.4 C)     TempSrc: Oral     SpO2: 98% 100% 96%   Weight:      Height:       SpO2: 96 % O2 Flow Rate  (L/min): 2 L/min FiO2 (%): 28 %   Intake/Output Summary (Last 24 hours) at 08/08/2020 1518 Last data filed at 08/08/2020 1107 Gross per 24 hour  Intake 240 ml  Output 450 ml  Net -210 ml   Filed Weights   08/06/20 0648 08/07/20 0537 08/08/20 0511  Weight: 65 kg 65 kg 63.5 kg    Exam: General exam: In no acute distress. Respiratory system: Good air movement and clear to auscultation. Cardiovascular system: S1 & S2 heard, RRR.  Positive JVD. Gastrointestinal system: Abdomen is nondistended, soft and nontender.  Extremities: No pedal edema. Skin: No rashes, lesions or ulcers   Data Reviewed:    Labs: Basic Metabolic Panel: Recent Labs  Lab 08/06/20 0655 08/06/20 1317 08/06/20 1630 08/07/20 0354 08/08/20 0526  NA 143  --   --  141 136  K 4.3  --   --  3.9 3.6  CL 108  --   --  106 99  CO2 19*  --   --  24 26  GLUCOSE 172*  --   --  113* 98  BUN 18  --   --  26* 31*  CREATININE 1.37* 1.42*  --  1.67* 1.62*  CALCIUM 9.3  --   --  9.3 9.0  MG  --   --  1.6* 2.5*  --    GFR Estimated Creatinine Clearance: 35.9 mL/min (A) (by C-G formula based on SCr of 1.62 mg/dL (H)). Liver Function Tests: No results for input(s): AST, ALT, ALKPHOS, BILITOT, PROT, ALBUMIN in the last 168 hours. No results for input(s): LIPASE, AMYLASE in the last 168 hours. No results for input(s): AMMONIA in the last 168 hours. Coagulation profile Recent Labs  Lab 08/06/20 0655  INR 1.3*   COVID-19 Labs  No results for input(s): DDIMER, FERRITIN, LDH, CRP in the last 72 hours.  Lab Results  Component Value Date   SARSCOV2NAA NEGATIVE 08/06/2020   [place] NEGATIVE 06/30/2020    CBC: Recent Labs  Lab 08/06/20 0655 08/06/20 1317 08/08/20 0526  WBC 7.3 6.5 8.3  HGB 12.7* 13.0 12.3*  HCT 39.7 38.9* 35.1*  MCV 100.3* 100.5* 94.9  PLT 144* 63* 153   Cardiac Enzymes: No results for input(s): CKTOTAL, CKMB, CKMBINDEX, TROPONINI in the last 168 hours. BNP (last 3 results) No  results for input(s): PROBNP in the last 8760 hours. CBG: Recent Labs  Lab 08/06/20 2107 08/07/20 0633  GLUCAP 132* 98   D-Dimer: No results for input(s): DDIMER in the last 72 hours. Hgb A1c: Recent Labs    08/06/20 1317  HGBA1C 6.1*   Lipid Profile:  Recent Labs    08/07/20 0352  CHOL 106  HDL 26*  LDLCALC 66  TRIG 68  CHOLHDL 4.1   Thyroid function studies: Recent Labs    08/06/20 1630  TSH 1.224   Anemia work up: No results for input(s): VITAMINB12, FOLATE, FERRITIN, TIBC, IRON, RETICCTPCT in the last 72 hours. Sepsis Labs: Recent Labs  Lab 08/06/20 0655 08/06/20 1317 08/06/20 2026 08/06/20 2236 08/08/20 0526  WBC 7.3 6.5  --   --  8.3  LATICACIDVEN  --   --  2.9* 1.8  --    Microbiology Recent Results (from the past 240 hour(s))  Resp Panel by RT-PCR (Flu A&B, Covid) Nasopharyngeal Swab     Status: None   Collection Time: 08/06/20  9:56 AM   Specimen: Nasopharyngeal Swab; Nasopharyngeal(NP) swabs in vial transport medium  Result Value Ref Range Status   SARS Coronavirus 2 by RT PCR NEGATIVE NEGATIVE Final    Comment: (NOTE) SARS-CoV-2 target nucleic acids are NOT DETECTED.  The SARS-CoV-2 RNA is generally detectable in upper respiratory specimens during the acute phase of infection. The lowest concentration of SARS-CoV-2 viral copies this assay can detect is 138 copies/mL. A negative result does not preclude SARS-Cov-2 infection and should not be used as the sole basis for treatment or other patient management decisions. A negative result may occur with  improper specimen collection/handling, submission of specimen other than nasopharyngeal swab, presence of viral mutation(s) within the areas targeted by this assay, and inadequate number of viral copies(<138 copies/mL). A negative result must be combined with clinical observations, patient history, and epidemiological information. The expected result is Negative.  Fact Sheet for Patients:   EntrepreneurPulse.com.au  Fact Sheet for Healthcare Providers:  IncredibleEmployment.be  This test is no t yet approved or cleared by the [place] FDA and  has been authorized for detection and/or diagnosis of SARS-CoV-2 by FDA under an Emergency Use Authorization (EUA). This EUA will remain  in effect (meaning this test can be used) for the duration of the COVID-19 declaration under Section 564(b)(1) of the Act, 21 U.S.C.section 360bbb-3(b)(1), unless the authorization is terminated  or revoked sooner.       Influenza A by PCR NEGATIVE NEGATIVE Final   Influenza B by PCR NEGATIVE NEGATIVE Final    Comment: (NOTE) The Xpert Xpress SARS-CoV-2/FLU/RSV plus assay is intended as an aid in the diagnosis of influenza from Nasopharyngeal swab specimens and should not be used as a sole basis for treatment. Nasal washings and aspirates are unacceptable for Xpert Xpress SARS-CoV-2/FLU/RSV testing.  Fact Sheet for Patients: EntrepreneurPulse.com.au  Fact Sheet for Healthcare Providers: IncredibleEmployment.be  This test is not yet approved or cleared by the [place] FDA and has been authorized for detection and/or diagnosis of SARS-CoV-2 by FDA under an Emergency Use Authorization (EUA). This EUA will remain in effect (meaning this test can be used) for the duration of the COVID-19 declaration under Section 564(b)(1) of the Act, 21 U.S.C. section 360bbb-3(b)(1), unless the authorization is terminated or revoked.  Performed at [facility], [place] [address] [place], [place] [address]      Medications:   . aspirin EC  81 mg Oral Daily  . cholecalciferol  1,000 Units Oral BID  . [START ON 08/09/2020] enoxaparin (LOVENOX) injection  40 mg Subcutaneous Q24H  . hydrALAZINE  12.5 mg Oral Q8H  . isosorbide mononitrate  15 mg Oral Daily  . metoprolol succinate  50 mg Oral Daily  .  sacubitril-valsartan  1 tablet Oral BID  . sodium chloride flush  3 mL Intravenous Q12H  . sodium chloride flush  3 mL Intravenous Q12H  . spironolactone  12.5 mg Oral Once  . [START ON 08/09/2020] spironolactone  25 mg Oral Daily   Continuous Infusions: . sodium chloride 50 mL/hr at 08/08/20 1455  . sodium chloride        LOS: 2 days   Charlynne Cousins  [facility]  08/08/2020, 3:18 PM

## 2020-08-08 NOTE — H&P (View-Only) (Signed)
Advanced Heart Failure Rounding Note  PCP-Cardiologist: No primary care provider on file.   Subjective:    Breathing improved. No supp O2 requirements. Off diuretics.  Wt down 3 lb. Scr stable 1.67>>1.62. BUN up 26>>31.   No CP. < 10 PVCs/hr on tele.  On for [facility] today.   No complaints currently.    Objective:   Weight Range: 63.5 kg Body mass index is 19.54 kg/m.   Vital Signs:   Temp:  [97.5 F (36.4 C)-98.4 F (36.9 C)] 97.7 F (36.5 C) (12/16 0932) Pulse Rate:  [59-81] 65 (12/16 0932) Resp:  [16-20] 16 (12/16 0932) BP: (111-147)/(76-127) 113/76 (12/16 0932) SpO2:  [97 %-99 %] 97 % (12/16 0932) Weight:  [63.5 kg] 63.5 kg (12/16 0511) Last BM Date: 08/07/20  Weight change: Filed Weights   08/06/20 0648 08/07/20 0537 08/08/20 0511  Weight: 65 kg 65 kg 63.5 kg    Intake/Output:   Intake/Output Summary (Last 24 hours) at 08/08/2020 1022 Last data filed at 08/08/2020 0749 Gross per 24 hour  Intake 460 ml  Output 800 ml  Net -340 ml      Physical Exam    General:  Thin but well appearing AAM. No respiratory difficulty HEENT: rt eye cataract otherwise normal Neck: supple. JVD ~ 8 cm. Carotids 2+ bilat; no bruits. No lymphadenopathy or thyromegaly appreciated. Cor: PMI nondisplaced. Regular rate & rhythm. No rubs, gallops or murmurs. Lungs: clear Abdomen: soft, nontender, nondistended. No hepatosplenomegaly. No bruits or masses. Good bowel sounds. Extremities: no cyanosis, clubbing, rash, edema Neuro: alert & oriented x 3, cranial nerves grossly intact. moves all 4 extremities w/o difficulty. Affect pleasant.    Telemetry   NSR w/ occasional PVCs <10/hr   EKG    N/A  Labs    CBC Recent Labs    08/06/20 1317 08/08/20 0526  WBC 6.5 8.3  HGB 13.0 12.3*  HCT 38.9* 35.1*  MCV 100.5* 94.9  PLT 63* 935   Basic Metabolic Panel Recent Labs    08/06/20 1630 08/07/20 0354 08/08/20 0526  NA  --  141 136  K  --  3.9 3.6  CL  --  106 99   CO2  --  24 26  GLUCOSE  --  113* 98  BUN  --  26* 31*  CREATININE  --  1.67* 1.62*  CALCIUM  --  9.3 9.0  MG 1.6* 2.5*  --    Liver Function Tests No results for input(s): AST, ALT, ALKPHOS, BILITOT, PROT, ALBUMIN in the last 72 hours. No results for input(s): LIPASE, AMYLASE in the last 72 hours. Cardiac Enzymes No results for input(s): CKTOTAL, CKMB, CKMBINDEX, TROPONINI in the last 72 hours.  BNP: BNP (last 3 results) Recent Labs    06/30/20 2354 08/06/20 0941  BNP 3,205.6* >4,500.0*    ProBNP (last 3 results) No results for input(s): PROBNP in the last 8760 hours.   D-Dimer No results for input(s): DDIMER in the last 72 hours. Hemoglobin A1C Recent Labs    08/06/20 1317  HGBA1C 6.1*   Fasting Lipid Panel Recent Labs    08/07/20 0352  CHOL 106  HDL 26*  LDLCALC 66  TRIG 68  CHOLHDL 4.1   Thyroid Function Tests Recent Labs    08/06/20 1630  TSH 1.224    Other results:   Imaging    No results found.   Medications:     Scheduled Medications: . aspirin EC  81 mg Oral Daily  .  cholecalciferol  1,000 Units Oral BID  . enoxaparin (LOVENOX) injection  40 mg Subcutaneous Q24H  . hydrALAZINE  12.5 mg Oral Q8H  . isosorbide mononitrate  15 mg Oral Daily  . metoprolol succinate  50 mg Oral Daily  . sacubitril-valsartan  1 tablet Oral BID  . sodium chloride flush  3 mL Intravenous Q12H  . spironolactone  12.5 mg Oral Daily    Infusions: . sodium chloride    . sodium chloride 10 mL/hr at 08/08/20 0747    PRN Medications: sodium chloride, acetaminophen, ondansetron (ZOFRAN) IV, sodium chloride flush    Patient Profile  74 y/o male with HTN, PAD. CKD 3a and chronic systolic HF due to presumed NICM EF 25%.   Admitted with ADHF.   Assessment/Plan    1. 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 Reported as NICM (notes indicate  nonobstructive CAD on cath in 2007, study report not on file) - Most recent echo 11/21 LVEF 20-25%, G2DD, diffuse HK, moderately reduced RV - This is 2nd admit for a/c CHF in 6 weeks.  - BNP >4,500. CXR w/ pulmonary edema.  - Volume status/ dyspnea improved w/ IV Lasix. Now off diuretics w/ AKI - Plan R/LHC today  - cMRI if no significant CAD  - Continue Entresto 97-103 - Continue Toprol XL 50 mg daily  - Continue hydralazine 12.5 mg three times a day + 15 mg imdur daily.  - Increase Spiro to 25 mg daily  - Plan SGLT2i at d/c  - May need eventual ICD  - Plan outpatient sleep study  - Consider CardioMEMS Implant   2. Chest Pain w/ Mildly Elevated Troponin  - CP atypical and not c/w ischemic CP  - Hs trop flat and low level, 53>>58, likely demand ischemia from a/c CHF - Plan R/LHC today   3. Stage IIIa CKD - Baseline SCr ~1.4-1.5 - 1.42 on admit-->1.6 today - Consider SGT2i at d/c    4. PVCs - < 10/hr on tele  - Continue Toprol 50 mg daily -  TSH nl  - Keep K > 4.0 and Mg >2.0 - Eventual Cath to r/o ischemia  - Outpatient sleep study to r/o OSA - W/ chronically low EF, will need EP evaluation for potential ICD   5. HTN:  - stable on current regimen - see meds above   6. PAD - s/p bifem bypass and aorto-iliac aneursym repair at [facility]  - denies claudication  - continue ASA  - Not on statin. LDL at goal at 66 mg/dL    7. Pulmonary Nodules - bilateral pulmonary nodules noted on chest CT 11/21 measuring up to approximately 7 mm - Non-contrast chest CT at 3-6 months is recommended - smoking cessation advised    Length of Stay: 2  Brittainy Simmons, PA-C  08/08/2020, 10:22 AM  Advanced Heart Failure Team Pager [phone] (M-F; 7a - 4p)  Please contact [facility] for night-coverage after hours (4p -7a ) and weekends on [email or web address]  Patient seen and examined with the above-signed Advanced Practice Provider and/or Housestaff. I personally reviewed  laboratory data, imaging studies and relevant notes. I independently examined the patient and formulated the important aspects of the plan. I have edited the note to reflect any of my changes or salient points. I have personally discussed the plan with the patient and/or family.  Denies orthopnea or PND. Still dyspneic on exertion. No  CP.   General:  Lying in bed  No resp difficulty HEENT: normal + R eye cataract Neck: supple. JVP 8-9 Carotids 2+ bilat; no bruits. No lymphadenopathy or thryomegaly appreciated. Cor: PMI nondisplaced. Regular rate & rhythm. No rubs, gallops or murmurs. Lungs: clear Abdomen: soft, nontender, nondistended. No hepatosplenomegaly. No bruits or masses. Good bowel sounds. Extremities: no cyanosis, clubbing, rash, edema Neuro: alert & orientedx3, cranial nerves grossly intact. moves all 4 extremities w/o difficulty. Affect pleasant  Feeling better. Volume status improved but neck veins still up a bit. Will plan R/L cath today to further evaluate. Procedure discussed in detail.   Glori Bickers, MD  11:27 AM

## 2020-08-08 NOTE — Progress Notes (Addendum)
Advanced Heart Failure Rounding Note  PCP-Cardiologist: No primary care provider on file.   Subjective:    Breathing improved. No supp O2 requirements. Off diuretics.  Wt down 3 lb. Scr stable 1.67>>1.62. BUN up 26>>31.   No CP. < 10 PVCs/hr on tele.  On for [facility] today.   No complaints currently.    Objective:   Weight Range: 63.5 kg Body mass index is 19.54 kg/m.   Vital Signs:   Temp:  [97.5 F (36.4 C)-98.4 F (36.9 C)] 97.7 F (36.5 C) (12/16 0932) Pulse Rate:  [59-81] 65 (12/16 0932) Resp:  [16-20] 16 (12/16 0932) BP: (111-147)/(76-127) 113/76 (12/16 0932) SpO2:  [97 %-99 %] 97 % (12/16 0932) Weight:  [63.5 kg] 63.5 kg (12/16 0511) Last BM Date: 08/07/20  Weight change: Filed Weights   08/06/20 0648 08/07/20 0537 08/08/20 0511  Weight: 65 kg 65 kg 63.5 kg    Intake/Output:   Intake/Output Summary (Last 24 hours) at 08/08/2020 1022 Last data filed at 08/08/2020 0749 Gross per 24 hour  Intake 460 ml  Output 800 ml  Net -340 ml      Physical Exam    General:  Thin but well appearing AAM. No respiratory difficulty HEENT: rt eye cataract otherwise normal Neck: supple. JVD ~ 8 cm. Carotids 2+ bilat; no bruits. No lymphadenopathy or thyromegaly appreciated. Cor: PMI nondisplaced. Regular rate & rhythm. No rubs, gallops or murmurs. Lungs: clear Abdomen: soft, nontender, nondistended. No hepatosplenomegaly. No bruits or masses. Good bowel sounds. Extremities: no cyanosis, clubbing, rash, edema Neuro: alert & oriented x 3, cranial nerves grossly intact. moves all 4 extremities w/o difficulty. Affect pleasant.    Telemetry   NSR w/ occasional PVCs <10/hr   EKG    N/A  Labs    CBC Recent Labs    08/06/20 1317 08/08/20 0526  WBC 6.5 8.3  HGB 13.0 12.3*  HCT 38.9* 35.1*  MCV 100.5* 94.9  PLT 63* 563   Basic Metabolic Panel Recent Labs    08/06/20 1630 08/07/20 0354 08/08/20 0526  NA  --  141 136  K  --  3.9 3.6  CL  --  106 99   CO2  --  24 26  GLUCOSE  --  113* 98  BUN  --  26* 31*  CREATININE  --  1.67* 1.62*  CALCIUM  --  9.3 9.0  MG 1.6* 2.5*  --    Liver Function Tests No results for input(s): AST, ALT, ALKPHOS, BILITOT, PROT, ALBUMIN in the last 72 hours. No results for input(s): LIPASE, AMYLASE in the last 72 hours. Cardiac Enzymes No results for input(s): CKTOTAL, CKMB, CKMBINDEX, TROPONINI in the last 72 hours.  BNP: BNP (last 3 results) Recent Labs    06/30/20 2354 08/06/20 0941  BNP 3,205.6* >4,500.0*    ProBNP (last 3 results) No results for input(s): PROBNP in the last 8760 hours.   D-Dimer No results for input(s): DDIMER in the last 72 hours. Hemoglobin A1C Recent Labs    08/06/20 1317  HGBA1C 6.1*   Fasting Lipid Panel Recent Labs    08/07/20 0352  CHOL 106  HDL 26*  LDLCALC 66  TRIG 68  CHOLHDL 4.1   Thyroid Function Tests Recent Labs    08/06/20 1630  TSH 1.224    Other results:   Imaging    No results found.   Medications:     Scheduled Medications: . aspirin EC  81 mg Oral Daily  .  cholecalciferol  1,000 Units Oral BID  . enoxaparin (LOVENOX) injection  40 mg Subcutaneous Q24H  . hydrALAZINE  12.5 mg Oral Q8H  . isosorbide mononitrate  15 mg Oral Daily  . metoprolol succinate  50 mg Oral Daily  . sacubitril-valsartan  1 tablet Oral BID  . sodium chloride flush  3 mL Intravenous Q12H  . spironolactone  12.5 mg Oral Daily    Infusions: . sodium chloride    . sodium chloride 10 mL/hr at 08/08/20 0747    PRN Medications: sodium chloride, acetaminophen, ondansetron (ZOFRAN) IV, sodium chloride flush    Patient Profile  74 y/o male with HTN, PAD. CKD 3a and chronic systolic HF due to presumed NICM EF 25%.   Admitted with ADHF.   Assessment/Plan    1. 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 Reported as NICM (notes indicate  nonobstructive CAD on cath in 2007, study report not on file) - Most recent echo 11/21 LVEF 20-25%, G2DD, diffuse HK, moderately reduced RV - This is 2nd admit for a/c CHF in 6 weeks.  - BNP >4,500. CXR w/ pulmonary edema.  - Volume status/ dyspnea improved w/ IV Lasix. Now off diuretics w/ AKI - Plan R/LHC today  - cMRI if no significant CAD  - Continue Entresto 97-103 - Continue Toprol XL 50 mg daily  - Continue hydralazine 12.5 mg three times a day + 15 mg imdur daily.  - Increase Spiro to 25 mg daily  - Plan SGLT2i at d/c  - May need eventual ICD  - Plan outpatient sleep study  - Consider CardioMEMS Implant   2. Chest Pain w/ Mildly Elevated Troponin  - CP atypical and not c/w ischemic CP  - Hs trop flat and low level, 53>>58, likely demand ischemia from a/c CHF - Plan R/LHC today   3. Stage IIIa CKD - Baseline SCr ~1.4-1.5 - 1.42 on admit-->1.6 today - Consider SGT2i at d/c    4. PVCs - < 10/hr on tele  - Continue Toprol 50 mg daily -  TSH nl  - Keep K > 4.0 and Mg >2.0 - Eventual Cath to r/o ischemia  - Outpatient sleep study to r/o OSA - W/ chronically low EF, will need EP evaluation for potential ICD   5. HTN:  - stable on current regimen - see meds above   6. PAD - s/p bifem bypass and aorto-iliac aneursym repair at [facility]  - denies claudication  - continue ASA  - Not on statin. LDL at goal at 66 mg/dL    7. Pulmonary Nodules - bilateral pulmonary nodules noted on chest CT 11/21 measuring up to approximately 7 mm - Non-contrast chest CT at 3-6 months is recommended - smoking cessation advised    Length of Stay: 2  Brittainy Simmons, PA-C  08/08/2020, 10:22 AM  Advanced Heart Failure Team Pager [phone] (M-F; 7a - 4p)  Please contact [facility] for night-coverage after hours (4p -7a ) and weekends on [email or web address]  Patient seen and examined with the above-signed Advanced Practice Provider and/or Housestaff. I personally reviewed  laboratory data, imaging studies and relevant notes. I independently examined the patient and formulated the important aspects of the plan. I have edited the note to reflect any of my changes or salient points. I have personally discussed the plan with the patient and/or family.  Denies orthopnea or PND. Still dyspneic on exertion. No  CP.   General:  Lying in bed  No resp difficulty HEENT: normal + R eye cataract Neck: supple. JVP 8-9 Carotids 2+ bilat; no bruits. No lymphadenopathy or thryomegaly appreciated. Cor: PMI nondisplaced. Regular rate & rhythm. No rubs, gallops or murmurs. Lungs: clear Abdomen: soft, nontender, nondistended. No hepatosplenomegaly. No bruits or masses. Good bowel sounds. Extremities: no cyanosis, clubbing, rash, edema Neuro: alert & orientedx3, cranial nerves grossly intact. moves all 4 extremities w/o difficulty. Affect pleasant  Feeling better. Volume status improved but neck veins still up a bit. Will plan R/L cath today to further evaluate. Procedure discussed in detail.   Glori Bickers, MD  11:27 AM

## 2020-08-08 NOTE — Interval H&P Note (Signed)
History and Physical Interval Note:  08/08/2020 11:29 AM  [name]  has presented today for surgery, with the diagnosis of hf.  The various methods of treatment have been discussed with the patient and family. After consideration of risks, benefits and other options for treatment, the patient has consented to  Procedure(s): RIGHT/LEFT HEART CATH AND CORONARY ANGIOGRAPHY (N/A) and possible coronary angioplasty as a surgical intervention.  The patient's history has been reviewed, patient examined, no change in status, stable for surgery.  I have reviewed the patient's chart and labs.  Questions were answered to the patient's satisfaction.     Ginia Rudell

## 2020-08-08 NOTE — Progress Notes (Signed)
MRI called unable to do cardiac MRI until tomorrow, also pt had 10 beats of V.Tach and run of svt patient asymptomatic, cardiology NP notified see lab order. Will continue to monitor the patient.

## 2020-08-09 ENCOUNTER — Inpatient Hospital Stay (HOSPITAL_COMMUNITY): Payer: Medicare Other

## 2020-08-09 DIAGNOSIS — I429 Cardiomyopathy, unspecified: Secondary | ICD-10-CM

## 2020-08-09 LAB — MAGNESIUM: Magnesium: 1.7 mg/dL (ref 1.7–2.4)

## 2020-08-09 LAB — BASIC METABOLIC PANEL
Anion gap: 10 (ref 5–15)
BUN: 28 mg/dL — ABNORMAL HIGH (ref 8–23)
CO2: 23 mmol/L (ref 22–32)
Calcium: 8.6 mg/dL — ABNORMAL LOW (ref 8.9–10.3)
Chloride: 102 mmol/L (ref 98–111)
Creatinine, Ser: 1.45 mg/dL — ABNORMAL HIGH (ref 0.61–1.24)
GFR, Estimated: 51 mL/min — ABNORMAL LOW (ref >60)
Glucose, Bld: 98 mg/dL (ref 70–99)
Potassium: 3.5 mmol/L (ref 3.5–5.1)
Sodium: 135 mmol/L (ref 135–145)

## 2020-08-09 MED ORDER — MAGNESIUM SULFATE 50 % IJ SOLN
3.0000 g | Freq: Once | INTRAVENOUS | Status: AC
  Administered 2020-08-09: 16:00:00 3 g via INTRAVENOUS
  Filled 2020-08-09: qty 6

## 2020-08-09 MED ORDER — ATORVASTATIN CALCIUM 40 MG PO TABS
40.0000 mg | ORAL_TABLET | Freq: Every day | ORAL | Status: DC
Start: 2020-08-09 — End: 2020-08-10
  Administered 2020-08-09 – 2020-08-10 (×2): 40 mg via ORAL
  Filled 2020-08-09 (×2): qty 1

## 2020-08-09 MED ORDER — HYDROCODONE-ACETAMINOPHEN 5-325 MG PO TABS
1.0000 | ORAL_TABLET | ORAL | Status: DC | PRN
  Administered 2020-08-09: 1 via ORAL
  Administered 2020-08-09: 2 via ORAL
  Filled 2020-08-09: qty 1
  Filled 2020-08-09: qty 2

## 2020-08-09 MED ORDER — POTASSIUM CHLORIDE CRYS ER 20 MEQ PO TBCR
60.0000 meq | EXTENDED_RELEASE_TABLET | Freq: Once | ORAL | Status: AC
  Administered 2020-08-09: 16:00:00 60 meq via ORAL
  Filled 2020-08-09: qty 3

## 2020-08-09 MED ORDER — GADOBUTROL 1 MMOL/ML IV SOLN
6.0000 mL | Freq: Once | INTRAVENOUS | Status: AC | PRN
  Administered 2020-08-09: 6 mL via INTRAVENOUS

## 2020-08-09 MED ORDER — DAPAGLIFLOZIN PROPANEDIOL 10 MG PO TABS
10.0000 mg | ORAL_TABLET | Freq: Every day | ORAL | Status: DC
  Administered 2020-08-09 – 2020-08-10 (×2): 10 mg via ORAL
  Filled 2020-08-09 (×2): qty 1

## 2020-08-09 NOTE — Progress Notes (Addendum)
[facility] PROGRESS NOTE    Progress Note  [name]  HYQ:[id number] DOB: 04-26-1946 DOA: 08/06/2020 PCP: Linus Mako, NP     Brief Narrative:   [name] is an 74 y.o. male past medical history significant of ischemic cardiomyopathy with an EF of 25%, chronic kidney disease stage IIIa, PAD essential hypertension comes in with worsening shortness of breath and left-sided chest pain Previous 2D echo showed an EF of 20% Cardiac cath in 2007 showed nonobstructive disease. Chest x-ray on admission showed pulmonary edema with a BNP of greater than 4500.   Assessment/Plan:   Acute respiratory failure with hypoxia 2/2 Acute on chronic HFrEF (heart failure with reduced ejection fraction) ([place]): Left and right heart cath scheduled for 08/08/2020 that showed moderate nonobstructive CAD, severe nonischemic cardiomyopathy with an EF around 15%. Cardiology recommended cardiac MRI. Currently on Entresto, Toprol, Imdur and Aldactone. Advanced heart failure team is on board, further management per them. May need milrinone for diuresis.  Chest pain with mild elevation of troponins: Chest pain is atypical and unlikely ischemic in the setting of chronic kidney disease stage III.  Troponins have remained flat.  Stage IIIa chronic kidney disease: With a baseline creatinine 1.4-1.5 Lasix held continue Aldactone his creatinine is improving.  PVCs/possibly monomorphic V. tach : Multiple episodes of what appears to be monomorphic V. tach on telemetry, he is currently on metoprolol. We will discuss with cards if EP needs to get involved.  Essential hypertension: Currently on Entresto Toprol and Aldactone.. Blood pressure is improved.   DVT prophylaxis: lovenox Family Communication:None Status is: Inpatient  Remains inpatient appropriate because:Hemodynamically unstable   Dispo: The patient is from: Home              Anticipated d/c is to: Home              Anticipated  d/c date is: > 3 days              Patient currently is not medically stable to d/c.  Code Status:     Code Status Orders  (From admission, onward)         Start     Ordered   08/06/20 1319  Full code  Continuous        08/06/20 1321        Code Status History    Date Active Date Inactive Code Status Order ID Comments User Context   07/01/2020 0211 07/02/2020 2338 Full Code [id number]  Vianne Bulls, MD ED   Advance Care Planning Activity        IV Access:    Peripheral IV   Procedures and diagnostic studies:   CARDIAC CATHETERIZATION  Result Date: 08/08/2020  Prox RCA to Mid RCA lesion is 50% stenosed.  Ost RCA to Prox RCA lesion is 30% stenosed.  Dist RCA lesion is 30% stenosed.  Prox Cx lesion is 30% stenosed.  Mid Cx to Dist Cx lesion is 30% stenosed.  Prox LAD lesion is 30% stenosed.  Mid LAD lesion is 40% stenosed.  2nd Diag-1 lesion is 50% stenosed.  2nd Diag-2 lesion is 70% stenosed.  Findings: Ao = 110/67 (85) LV = 109/20 RA = 8 RV = 49/14 PA = 51/25 (37) PCW = 25 (v = 31) Fick cardiac output/index = 3.6/1.9 PVR = 3.4 WU SVR = 1736 FA sat = 98% PA sat = 57%. 57% Assessment: 1. Moderate non-obstructive CAD 2. Severe NICM EF 15% 3. Elevated filling pressures with low  output Plan/Discussion: He has severe LV dysfunction due to NICM. Will get cMRI to further evaluate. If renal function stable post cat will resume diuresis as tolerated. May need milrinone support to diurese further. Glori Bickers, MD 12:30 PM     Medical Consultants:    None.  Anti-Infectives:   none  Subjective:    [name] a lot better not short of breath denies chest pain.  Objective:    Vitals:   08/08/20 1908 08/08/20 1953 08/08/20 2141 08/09/20 0507  BP: 102/79 111/65 111/78 117/82  Pulse: 61 66  63  Resp: 14 18  20   Temp: 97.9 F (36.6 C) 97.6 F (36.4 C)  98.2 F (36.8 C)  TempSrc: Oral Oral  Oral  SpO2:  100%  93%  Weight:    63.7 kg  Height:        SpO2: 93 % O2 Flow Rate (L/min): 2 L/min FiO2 (%): 28 %   Intake/Output Summary (Last 24 hours) at 08/09/2020 0857 Last data filed at 08/09/2020 0829 Gross per 24 hour  Intake 1322.86 ml  Output 725 ml  Net 597.86 ml   Filed Weights   08/07/20 0537 08/08/20 0511 08/09/20 0507  Weight: 65 kg 63.5 kg 63.7 kg    Exam: General exam: In no acute distress. Respiratory system: Good air movement and clear to auscultation. Cardiovascular system: S1 & S2 heard, RRR.  Positive JVD. Gastrointestinal system: Abdomen is nondistended, soft and nontender.  Extremities: No pedal edema. Skin: No rashes, lesions or ulcers   Data Reviewed:    Labs: Basic Metabolic Panel: Recent Labs  Lab 08/06/20 0655 08/06/20 1317 08/06/20 1630 08/07/20 0354 08/08/20 0526 08/08/20 1156 08/08/20 1201 08/08/20 1904 08/09/20 0318  NA 143  --   --  141 136 140 144  140 137 135  K 4.3  --   --  3.9 3.6 3.4* 2.8*  3.3* 3.8 3.5  CL 108  --   --  106 99  --   --  101 102  CO2 19*  --   --  24 26  --   --  25 23  GLUCOSE 172*  --   --  113* 98  --   --  114* 98  BUN 18  --   --  26* 31*  --   --  31* 28*  CREATININE 1.37* 1.42*  --  1.67* 1.62*  --   --  1.64* 1.45*  CALCIUM 9.3  --   --  9.3 9.0  --   --  8.8* 8.6*  MG  --   --  1.6* 2.5*  --   --   --   --  1.7   GFR Estimated Creatinine Clearance: 40.3 mL/min (A) (by C-G formula based on SCr of 1.45 mg/dL (H)). Liver Function Tests: No results for input(s): AST, ALT, ALKPHOS, BILITOT, PROT, ALBUMIN in the last 168 hours. No results for input(s): LIPASE, AMYLASE in the last 168 hours. No results for input(s): AMMONIA in the last 168 hours. Coagulation profile Recent Labs  Lab 08/06/20 0655  INR 1.3*   COVID-19 Labs  No results for input(s): DDIMER, FERRITIN, LDH, CRP in the last 72 hours.  Lab Results  Component Value Date   SARSCOV2NAA NEGATIVE 08/06/2020   [place] NEGATIVE 06/30/2020    CBC: Recent Labs  Lab  08/06/20 0655 08/06/20 1317 08/08/20 0526 08/08/20 1156 08/08/20 1201  WBC 7.3 6.5 8.3  --   --   HGB 12.7*  13.0 12.3* 12.9* 11.9*  12.9*  HCT 39.7 38.9* 35.1* 38.0* 35.0*  38.0*  MCV 100.3* 100.5* 94.9  --   --   PLT 144* 63* 153  --   --    Cardiac Enzymes: No results for input(s): CKTOTAL, CKMB, CKMBINDEX, TROPONINI in the last 168 hours. BNP (last 3 results) No results for input(s): PROBNP in the last 8760 hours. CBG: Recent Labs  Lab 08/06/20 2107 08/07/20 0633  GLUCAP 132* 98   D-Dimer: No results for input(s): DDIMER in the last 72 hours. Hgb A1c: Recent Labs    08/06/20 1317  HGBA1C 6.1*   Lipid Profile: Recent Labs    08/07/20 0352  CHOL 106  HDL 26*  LDLCALC 66  TRIG 68  CHOLHDL 4.1   Thyroid function studies: Recent Labs    08/06/20 1630  TSH 1.224   Anemia work up: No results for input(s): VITAMINB12, FOLATE, FERRITIN, TIBC, IRON, RETICCTPCT in the last 72 hours. Sepsis Labs: Recent Labs  Lab 08/06/20 0655 08/06/20 1317 08/06/20 2026 08/06/20 2236 08/08/20 0526  WBC 7.3 6.5  --   --  8.3  LATICACIDVEN  --   --  2.9* 1.8  --    Microbiology Recent Results (from the past 240 hour(s))  Resp Panel by RT-PCR (Flu A&B, Covid) Nasopharyngeal Swab     Status: None   Collection Time: 08/06/20  9:56 AM   Specimen: Nasopharyngeal Swab; Nasopharyngeal(NP) swabs in vial transport medium  Result Value Ref Range Status   SARS Coronavirus 2 by RT PCR NEGATIVE NEGATIVE Final    Comment: (NOTE) SARS-CoV-2 target nucleic acids are NOT DETECTED.  The SARS-CoV-2 RNA is generally detectable in upper respiratory specimens during the acute phase of infection. The lowest concentration of SARS-CoV-2 viral copies this assay can detect is 138 copies/mL. A negative result does not preclude SARS-Cov-2 infection and should not be used as the sole basis for treatment or other patient management decisions. A negative result may occur with  improper specimen  collection/handling, submission of specimen other than nasopharyngeal swab, presence of viral mutation(s) within the areas targeted by this assay, and inadequate number of viral copies(<138 copies/mL). A negative result must be combined with clinical observations, patient history, and epidemiological information. The expected result is Negative.  Fact Sheet for Patients:  EntrepreneurPulse.com.au  Fact Sheet for Healthcare Providers:  IncredibleEmployment.be  This test is no t yet approved or cleared by the [place] FDA and  has been authorized for detection and/or diagnosis of SARS-CoV-2 by FDA under an Emergency Use Authorization (EUA). This EUA will remain  in effect (meaning this test can be used) for the duration of the COVID-19 declaration under Section 564(b)(1) of the Act, 21 U.S.C.section 360bbb-3(b)(1), unless the authorization is terminated  or revoked sooner.       Influenza A by PCR NEGATIVE NEGATIVE Final   Influenza B by PCR NEGATIVE NEGATIVE Final    Comment: (NOTE) The Xpert Xpress SARS-CoV-2/FLU/RSV plus assay is intended as an aid in the diagnosis of influenza from Nasopharyngeal swab specimens and should not be used as a sole basis for treatment. Nasal washings and aspirates are unacceptable for Xpert Xpress SARS-CoV-2/FLU/RSV testing.  Fact Sheet for Patients: EntrepreneurPulse.com.au  Fact Sheet for Healthcare Providers: IncredibleEmployment.be  This test is not yet approved or cleared by the [place] FDA and has been authorized for detection and/or diagnosis of SARS-CoV-2 by FDA under an Emergency Use Authorization (EUA). This EUA will remain in effect (meaning this  test can be used) for the duration of the COVID-19 declaration under Section 564(b)(1) of the Act, 21 U.S.C. section 360bbb-3(b)(1), unless the authorization is terminated or revoked.  Performed at [facility], [place] [address] [place], [place] [address]      Medications:   . aspirin EC  81 mg Oral Daily  . cholecalciferol  1,000 Units Oral BID  . enoxaparin (LOVENOX) injection  40 mg Subcutaneous Q24H  . hydrALAZINE  12.5 mg Oral Q8H  . isosorbide mononitrate  15 mg Oral Daily  . metoprolol succinate  50 mg Oral Daily  . sacubitril-valsartan  1 tablet Oral BID  . sodium chloride flush  3 mL Intravenous Q12H  . sodium chloride flush  3 mL Intravenous Q12H  . spironolactone  25 mg Oral Daily   Continuous Infusions: . sodium chloride        LOS: 3 days   Charlynne Cousins  [facility]  08/09/2020, 8:57 AM

## 2020-08-09 NOTE — Progress Notes (Addendum)
Notified by CCMD that pt had 9 beat run of VTach. Pt asymptomatic. MD notified. Will continue to monitor the pt.

## 2020-08-09 NOTE — Progress Notes (Signed)
Patient is still in cardiac MRI Battery charged waited in Transportation to return to 3e unit

## 2020-08-09 NOTE — Plan of Care (Signed)
  Problem: Activity: Goal: Risk for activity intolerance will decrease Outcome: Progressing   Problem: Safety: Goal: Ability to remain free from injury will improve Outcome: Progressing   Problem: Activity: Goal: Capacity to carry out activities will improve Outcome: Progressing

## 2020-08-09 NOTE — Progress Notes (Addendum)
Advanced Heart Failure Rounding Note  PCP-Cardiologist: No primary care provider on file.   Subjective:    NICM. R/LHC yesterday w/ moderate non-obstructive CAD and elevated filling pressure and low output. CI 1.9  Overall feels better after diuresis. No further dyspnea. No gross volume overload on exam. ReDs Clip 30%. Scr trending down, 1.64>>1.45. SBPs 110.   Getting cMRI today.   Only complaint today is LBP. He fell off a later a few weeks ago. Primary team has ordered PRN pain meds.   Also complains of stye rt lower eyelid.   Objective:   Weight Range: 63.7 kg Body mass index is 19.6 kg/m.   Vital Signs:   Temp:  [97.5 F (36.4 C)-98.2 F (36.8 C)] 97.5 F (36.4 C) (12/17 1145) Pulse Rate:  [56-66] 61 (12/17 1145) Resp:  [14-20] 18 (12/17 1145) BP: (102-121)/(65-83) 106/76 (12/17 1145) SpO2:  [93 %-100 %] 94 % (12/17 1145) Weight:  [63.7 kg] 63.7 kg (12/17 0507) Last BM Date: 08/08/20  Weight change: Filed Weights   08/07/20 0537 08/08/20 0511 08/09/20 0507  Weight: 65 kg 63.5 kg 63.7 kg    Intake/Output:   Intake/Output Summary (Last 24 hours) at 08/09/2020 1209 Last data filed at 08/09/2020 0829 Gross per 24 hour  Intake 1322.86 ml  Output 575 ml  Net 747.86 ml      Physical Exam    PHYSICAL EXAM: General:  Well appearing, sitting up in chair. No respiratory difficulty HEENT: normal Neck: supple. no JVD. Carotids 2+ bilat; no bruits. No lymphadenopathy or thyromegaly appreciated. Cor: PMI nondisplaced. Regular rate & rhythm. No rubs, gallops or murmurs. Lungs: clear Abdomen: soft, nontender, nondistended. No hepatosplenomegaly. No bruits or masses. Good bowel sounds. Extremities: no cyanosis, clubbing, rash, edema Neuro: alert & oriented x 3, cranial nerves grossly intact. moves all 4 extremities w/o difficulty. Affect pleasant.    Telemetry   NSR w/ brief NSVT 5 beats   EKG    N/A  Labs    CBC Recent Labs    08/06/20 1317  08/08/20 0526 08/08/20 1156 08/08/20 1201  WBC 6.5 8.3  --   --   HGB 13.0 12.3* 12.9* 11.9*  12.9*  HCT 38.9* 35.1* 38.0* 35.0*  38.0*  MCV 100.5* 94.9  --   --   PLT 63* 153  --   --    Basic Metabolic Panel Recent Labs    08/07/20 0354 08/08/20 0526 08/08/20 1904 08/09/20 0318  NA 141   < > 137 135  K 3.9   < > 3.8 3.5  CL 106   < > 101 102  CO2 24   < > 25 23  GLUCOSE 113*   < > 114* 98  BUN 26*   < > 31* 28*  CREATININE 1.67*   < > 1.64* 1.45*  CALCIUM 9.3   < > 8.8* 8.6*  MG 2.5*  --   --  1.7   < > = values in this interval not displayed.   Liver Function Tests No results for input(s): AST, ALT, ALKPHOS, BILITOT, PROT, ALBUMIN in the last 72 hours. No results for input(s): LIPASE, AMYLASE in the last 72 hours. Cardiac Enzymes No results for input(s): CKTOTAL, CKMB, CKMBINDEX, TROPONINI in the last 72 hours.  BNP: BNP (last 3 results) Recent Labs    06/30/20 2354 08/06/20 0941  BNP 3,205.6* >4,500.0*    ProBNP (last 3 results) No results for input(s): PROBNP in the last 8760 hours.  D-Dimer No results for input(s): DDIMER in the last 72 hours. Hemoglobin A1C Recent Labs    08/06/20 1317  HGBA1C 6.1*   Fasting Lipid Panel Recent Labs    08/07/20 0352  CHOL 106  HDL 26*  LDLCALC 66  TRIG 68  CHOLHDL 4.1   Thyroid Function Tests Recent Labs    08/06/20 1630  TSH 1.224    Other results:   Imaging    No results found.   Medications:     Scheduled Medications: . aspirin EC  81 mg Oral Daily  . cholecalciferol  1,000 Units Oral BID  . enoxaparin (LOVENOX) injection  40 mg Subcutaneous Q24H  . hydrALAZINE  12.5 mg Oral Q8H  . isosorbide mononitrate  15 mg Oral Daily  . metoprolol succinate  50 mg Oral Daily  . potassium chloride  60 mEq Oral Once  . sacubitril-valsartan  1 tablet Oral BID  . sodium chloride flush  3 mL Intravenous Q12H  . sodium chloride flush  3 mL Intravenous Q12H  . spironolactone  25 mg Oral Daily     Infusions: . sodium chloride    . magnesium sulfate bolus IVPB      PRN Medications: sodium chloride, acetaminophen, HYDROcodone-acetaminophen, ondansetron (ZOFRAN) IV, sodium chloride flush    Patient Profile  74 y/o male with HTN, PAD. CKD 3a and chronic systolic HF due to presumed NICM EF 25%.   Admitted with ADHF.   Assessment/Plan    1. 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 Reported as NICM (notes indicate nonobstructive CAD on cath in 2007, study report not on file) - Most recent echo 11/21 LVEF 20-25%, G2DD, diffuse HK, moderately reduced RV - This is 2nd admit for a/c CHF in 6 weeks.  - BNP >4,500 on admit. CXR w/ pulmonary edema.  - NICM. R/LHC w/ mod no-obs CAD, elevated filling pressures and CI 1.9  - Volume status/ dyspnea improved w/ IV Lasix. Now off diuretics w/ AKI (improving) - ReDs Clip 30% today  - Continue Entresto 97-103 - Continue Toprol XL 50 mg daily  - Continue hydralazine 12.5 mg three times a day + 15 mg imdur daily.  - Continue Spiro 25 mg daily  - Add SGLT2i, Farxiga 10 mg daily (hgb A1c 6.1) - Plan cRMI today  - May need eventual ICD  - Plan outpatient sleep study  - Consider CardioMEMS Implant   2. Chest Pain w/ Mildly Elevated Troponin  - CP atypical and not c/w ischemic CP  - Hs trop flat and low level, 53>>58, likely demand ischemia from a/c CHF - LHC this admit w/ mod non-obs CAD - Continue ASA +  blocker - LDL ok  At 66 but will add statin for plaque stabilization, atorvastatin 40   3. Stage IIIa CKD - Baseline SCr ~1.4-1.5 - 1.42 on admit-->1.6-->1.45 - Add SGLTi  - Follow BMP   4. PVCs - < 10/hr on tele  - cath w/ non obs CAD  - Continue Toprol 50 mg daily -  TSH nl  - Keep K > 4.0 and Mg >2.0 - Outpatient sleep study to r/o OSA - W/ chronically low EF, will need EP evaluation for potential ICD   5. HTN:   - stable on current regimen - see meds above   6. PAD - s/p bifem bypass and aort[facility]iliac aneursym repair at [name]  - denies claudication  -  continue ASA  - add statin, atorvastatin 40   7. Pulmonary Nodules - bilateral pulmonary nodules noted on chest CT 11/21 measuring up to approximately 7 mm - Non-contrast chest CT at 3-6 months is recommended - smoking cessation advised   8. Rt Eyelid Stye - warm compresses    Length of Stay: 3  Brittainy Simmons, PA-C  08/09/2020, 12:09 PM  Advanced Heart Failure Team Pager [phone] (M-F; 7a - 4p)  Please contact [facility] for night-coverage after hours (4p -7a ) and weekends on [email or web address]  Patient seen and examined with the above-signed Advanced Practice Provider and/or Housestaff. I personally reviewed laboratory data, imaging studies and relevant notes. I independently examined the patient and formulated the important aspects of the plan. I have edited the note to reflect any of my changes or salient points. I have personally discussed the plan with the patient and/or family.  Cath results reviewed with him and his family. Feels ok. Lying flat. No CP, orthopnea or PND. REDS 30% (normal lung water)  General:  Elderly male. No resp difficulty HEENT: normal R eye cataracts Neck: supple. JVP 7-8  Carotids 2+ bilat; no bruits. No lymphadenopathy or thryomegaly appreciated. Cor: PMI nondisplaced. Regular rate & rhythm. No rubs, gallops or murmurs. Lungs: clear Abdomen: soft, nontender, nondistended. No hepatosplenomegaly. No bruits or masses. Good bowel sounds. Extremities: no cyanosis, clubbing, rash, edema Neuro: alert & orientedx3, cranial nerves grossly intact. moves all 4 extremities w/o difficulty. Affect pleasant  He has severe HF due to NICM. Volume status ok. Agree with med changes as above. Await results of cMRI. Hopefully home in am.   Glori Bickers, MD  4:19 PM

## 2020-08-10 LAB — BASIC METABOLIC PANEL
Anion gap: 9 (ref 5–15)
BUN: 26 mg/dL — ABNORMAL HIGH (ref 8–23)
CO2: 26 mmol/L (ref 22–32)
Calcium: 8.7 mg/dL — ABNORMAL LOW (ref 8.9–10.3)
Chloride: 104 mmol/L (ref 98–111)
Creatinine, Ser: 1.47 mg/dL — ABNORMAL HIGH (ref 0.61–1.24)
GFR, Estimated: 50 mL/min — ABNORMAL LOW (ref >60)
Glucose, Bld: 104 mg/dL — ABNORMAL HIGH (ref 70–99)
Potassium: 4 mmol/L (ref 3.5–5.1)
Sodium: 139 mmol/L (ref 135–145)

## 2020-08-10 LAB — MAGNESIUM: Magnesium: 2.3 mg/dL (ref 1.7–2.4)

## 2020-08-10 MED ORDER — POTASSIUM CHLORIDE ER 10 MEQ PO TBCR: 20.0000 meq | EXTENDED_RELEASE_TABLET | Freq: Every day | ORAL | 0 refills | Status: DC

## 2020-08-10 MED ORDER — ATORVASTATIN CALCIUM 40 MG PO TABS: 40.0000 mg | ORAL_TABLET | Freq: Every day | ORAL | 0 refills | Status: DC

## 2020-08-10 MED ORDER — HYDRALAZINE HCL 25 MG PO TABS: 12.5000 mg | ORAL_TABLET | Freq: Three times a day (TID) | ORAL | 0 refills | Status: DC

## 2020-08-10 MED ORDER — SPIRONOLACTONE 25 MG PO TABS: 25.0000 mg | ORAL_TABLET | Freq: Every day | ORAL | 3 refills | Status: DC

## 2020-08-10 MED ORDER — FUROSEMIDE 40 MG PO TABS: 40.0000 mg | ORAL_TABLET | Freq: Every day | ORAL | 11 refills | Status: DC

## 2020-08-10 MED ORDER — DAPAGLIFLOZIN PROPANEDIOL 10 MG PO TABS: 10.0000 mg | ORAL_TABLET | Freq: Every day | ORAL | 0 refills | Status: DC

## 2020-08-10 MED ORDER — ISOSORBIDE MONONITRATE ER 30 MG PO TB24: 15.0000 mg | ORAL_TABLET | Freq: Every day | ORAL | 0 refills | Status: DC

## 2020-08-10 MED ORDER — INFLUENZA VAC A&B SA ADJ QUAD 0.5 ML IM PRSY
0.5000 mL | PREFILLED_SYRINGE | INTRAMUSCULAR | Status: DC
  Filled 2020-08-10: qty 0.5

## 2020-08-10 NOTE — Progress Notes (Addendum)
Pt. Had 9 beat run of V-Tach. No distress noted. Pt. Resting in bed. Will continue to monitor.

## 2020-08-10 NOTE — Discharge Summary (Signed)
Physician Discharge Summary  [name] XBD:[id number] DOB: 22-Dec-1945 DOA: 08/06/2020  PCP: Linus Mako, NP  Admit date: 08/06/2020 Discharge date: 08/10/2020  Admitted From: Home Disposition:  Home  Recommendations for Outpatient Follow-up:  1. Follow up with with the advanced heart failure clinic 1-2 weeks follow-up on cardiac MRI 2. Please obtain BMP/CBC in one week. 3. PCP to repeat CT scan in 3 to 6 months to evaluate bilateral pulmonary nodules.   Home Health:No Equipment/Devices:None  Discharge Condition:Stable CODE STATUS:Full Diet recommendation: Heart Healthy   Brief/Interim Summary: 74 y.o. male past medical history significant of ischemic cardiomyopathy with an EF of 25%, chronic kidney disease stage IIIa, PAD essential hypertension comes in with worsening shortness of breath and left-sided chest pain Previous 2D echo showed an EF of 20% Cardiac cath in 2007 showed nonobstructive disease. Chest x-ray on admission showed pulmonary edema with a BNP of greater than 4500  Discharge Diagnoses:  Active Problems:   Essential hypertension   Monomorphic ventricular tachycardia (HCC)   Chronic kidney disease, stage 3a (HCC)   Acute on chronic HFrEF (heart failure with reduced ejection fraction) (HCC)   Atypical chest pain   Elevated troponin Acute respiratory failure with hypoxia secondary to acute on chronic reduced ejection fraction heart failure: He was started on IV diuresis, the advanced heart failure team was consulted they recommended a left and right heart cath that was done on 07/29/2020 that showed moderate nonobstructive CAD, severe ischemic cardiomyopathy with an EF of 15% and elevated filling pressure with a cardiac index of 1.9. Cardiac MRI was ordered which is pending at the time of this dictation. He was diuresed until euvolemic, his medications were titrated to go home on [place], Imdur, Aldactone and [place].  Atypical chest pain: 1 elevated  troponins basically remained flat in the setting of chronic renal disease and heart failure likely demand ischemia.  Stage IIIa chronic kidney disease: His creatinine remained at baseline he will go home on Aldactone and Lasix his creatinine has remained stable.  PVCs: He was started on metoprolol which she will continue as an outpatient. Cardiology to determine as an outpatient if EP needs to get involved.  Essential hypertension: Continue Entresto, Toprol, Aldactone, hydralazine, Imdur and Aldactone.  Bilateral pulmonary nodules: Seen on CT on 07/14/2020, has been stable since 2007, repeat a CT scan as an outpatient in 3 to 6 months.   Discharge Instructions  Discharge Instructions    Diet - low sodium heart healthy   Complete by: As directed    Increase activity slowly   Complete by: As directed      Allergies as of 08/10/2020   No Known Allergies     Medication List    TAKE these medications   aspirin 81 MG tablet Take 81 mg by mouth daily.   atorvastatin 40 MG tablet Commonly known as: LIPITOR Take 1 tablet (40 mg total) by mouth daily. Start taking on: August 11, 2020   cholecalciferol 1000 units tablet Commonly known as: VITAMIN D Take 1,000 Units by mouth 2 (two) times daily.   dapagliflozin propanediol 10 MG Tabs tablet Commonly known as: FARXIGA Take 1 tablet (10 mg total) by mouth daily. Start taking on: August 11, 2020   furosemide 20 MG tablet Commonly known as: LASIX Take 20 mg by mouth daily.   hydrALAZINE 25 MG tablet Commonly known as: APRESOLINE Take 0.5 tablets (12.5 mg total) by mouth every 8 (eight) hours.   isosorbide mononitrate 30 MG 24 hr tablet  Commonly known as: IMDUR Take 0.5 tablets (15 mg total) by mouth daily. Start taking on: August 11, 2020   metoprolol succinate 100 MG 24 hr tablet Commonly known as: TOPROL-XL Take 50 mg by mouth daily. Take with or immediately following a meal.   sacubitril-valsartan 97-103  MG Commonly known as: ENTRESTO Take 1 tablet by mouth 2 (two) times daily.   spironolactone 25 MG tablet Commonly known as: ALDACTONE Take 1 tablet (25 mg total) by mouth daily. Start taking on: August 11, 2020       Follow-up Information    Raji, Sharlynn Oliphant, NP.   Specialty: Nurse Practitioner Why: Please follow up in a week Contact information: [place] [id number] [place] Follow up on 09/02/2020.   Specialty: Cardiology Why: 3:30 PM [facility] at [facility], [place] Code 3007 Contact information: [address] [id number] [place] [phone]             No Known Allergies  Consultations:  Advanced heart failure team.   Procedures/Studies: DG Chest 2 View  Result Date: 08/06/2020 CLINICAL DATA:  Shortness of breath. EXAM: CHEST - 2 VIEW COMPARISON:  Chest radiograph and CTA 07/01/2020 FINDINGS: The cardiac silhouette remains mildly enlarged. Pulmonary vascular congestion and bilateral interstitial opacities are similar to the prior study. There may be trace pleural effusions. No pneumothorax is identified. An abdominal aortic stent graft is partially visualized. No acute osseous abnormality is seen. IMPRESSION: Cardiomegaly with bilateral lung opacities compatible with edema. Electronically Signed   By: Logan Bores M.D.   On: 08/06/2020 07:10   CARDIAC CATHETERIZATION  Result Date: 08/08/2020  Prox RCA to Mid RCA lesion is 50% stenosed.  Ost RCA to Prox RCA lesion is 30% stenosed.  Dist RCA lesion is 30% stenosed.  Prox Cx lesion is 30% stenosed.  Mid Cx to Dist Cx lesion is 30% stenosed.  Prox LAD lesion is 30% stenosed.  Mid LAD lesion is 40% stenosed.  2nd Diag-1 lesion is 50% stenosed.  2nd Diag-2 lesion is 70% stenosed.  Findings: Ao = 110/67 (85) LV = 109/20 RA = 8 RV = 49/14 PA  = 51/25 (37) PCW = 25 (v = 31) Fick cardiac output/index = 3.6/1.9 PVR = 3.4 WU SVR = 1736 FA sat = 98% PA sat = 57%. 57% Assessment: 1. Moderate non-obstructive CAD 2. Severe NICM EF 15% 3. Elevated filling pressures with low output Plan/Discussion: He has severe LV dysfunction due to NICM. Will get cMRI to further evaluate. If renal function stable post cat will resume diuresis as tolerated. May need milrinone support to diurese further. Glori Bickers, MD 12:30 PM    Subjective: No new complaints feels great.  Discharge Exam: Vitals:   08/09/20 2225 08/10/20 0711  BP: 106/68 115/83  Pulse: (!) 59 (!) 58  Resp: 20 20  Temp: 98.7 F (37.1 C) 97.7 F (36.5 C)  SpO2: 98% 100%   Vitals:   08/09/20 1145 08/09/20 2225 08/10/20 0032 08/10/20 0711  BP: 106/76 106/68  115/83  Pulse: 61 (!) 59  (!) 58  Resp: 18 20  20   Temp: (!) 97.5 F (36.4 C) 98.7 F (37.1 C)  97.7 F (36.5 C)  TempSrc: Oral Oral  Oral  SpO2: 94% 98%  100%  Weight:   63.1 kg   Height:  General: Pt is alert, awake, not in acute distress Cardiovascular: RRR, S1/S2 +, no rubs, no gallops Respiratory: CTA bilaterally, no wheezing, no rhonchi Abdominal: Soft, NT, ND, bowel sounds + Extremities: no edema, no cyanosis    The results of significant diagnostics from this hospitalization (including imaging, microbiology, ancillary and laboratory) are listed below for reference.     Microbiology: Recent Results (from the past 240 hour(s))  Resp Panel by RT-PCR (Flu A&B, Covid) Nasopharyngeal Swab     Status: None   Collection Time: 08/06/20  9:56 AM   Specimen: Nasopharyngeal Swab; Nasopharyngeal(NP) swabs in vial transport medium  Result Value Ref Range Status   SARS Coronavirus 2 by RT PCR NEGATIVE NEGATIVE Final    Comment: (NOTE) SARS-CoV-2 target nucleic acids are NOT DETECTED.  The SARS-CoV-2 RNA is generally detectable in upper respiratory specimens during the acute phase of infection. The  lowest concentration of SARS-CoV-2 viral copies this assay can detect is 138 copies/mL. A negative result does not preclude SARS-Cov-2 infection and should not be used as the sole basis for treatment or other patient management decisions. A negative result may occur with  improper specimen collection/handling, submission of specimen other than nasopharyngeal swab, presence of viral mutation(s) within the areas targeted by this assay, and inadequate number of viral copies(<138 copies/mL). A negative result must be combined with clinical observations, patient history, and epidemiological information. The expected result is Negative.  Fact Sheet for Patients:  EntrepreneurPulse.com.au  Fact Sheet for Healthcare Providers:  IncredibleEmployment.be  This test is no t yet approved or cleared by the [place] FDA and  has been authorized for detection and/or diagnosis of SARS-CoV-2 by FDA under an Emergency Use Authorization (EUA). This EUA will remain  in effect (meaning this test can be used) for the duration of the COVID-19 declaration under Section 564(b)(1) of the Act, 21 U.S.C.section 360bbb-3(b)(1), unless the authorization is terminated  or revoked sooner.       Influenza A by PCR NEGATIVE NEGATIVE Final   Influenza B by PCR NEGATIVE NEGATIVE Final    Comment: (NOTE) The Xpert Xpress SARS-CoV-2/FLU/RSV plus assay is intended as an aid in the diagnosis of influenza from Nasopharyngeal swab specimens and should not be used as a sole basis for treatment. Nasal washings and aspirates are unacceptable for Xpert Xpress SARS-CoV-2/FLU/RSV testing.  Fact Sheet for Patients: EntrepreneurPulse.com.au  Fact Sheet for Healthcare Providers: IncredibleEmployment.be  This test is not yet approved or cleared by the [place] FDA and has been authorized for detection and/or diagnosis of SARS-CoV-2 by FDA under  an Emergency Use Authorization (EUA). This EUA will remain in effect (meaning this test can be used) for the duration of the COVID-19 declaration under Section 564(b)(1) of the Act, 21 U.S.C. section 360bbb-3(b)(1), unless the authorization is terminated or revoked.  Performed at [facility], [place] [address], [place], [place] [address]      Labs: BNP (last 3 results) Recent Labs    06/30/20 2354 08/06/20 0941  BNP 3,205.6* >8,453.6*   Basic Metabolic Panel: Recent Labs  Lab 08/06/20 1630 08/07/20 0354 08/08/20 0526 08/08/20 1156 08/08/20 1201 08/08/20 1904 08/09/20 0318 08/10/20 0347  NA  --  141 136 140 144   140 137 135 139  K  --  3.9 3.6 3.4* 2.8*   3.3* 3.8 3.5 4.0  CL  --  106 99  --   --  101 102 104  CO2  --  24 26  --   --  25 23 26   GLUCOSE  --  113* 98  --   --  114* 98 104*  BUN  --  26* 31*  --   --  31* 28* 26*  CREATININE  --  1.67* 1.62*  --   --  1.64* 1.45* 1.47*  CALCIUM  --  9.3 9.0  --   --  8.8* 8.6* 8.7*  MG 1.6* 2.5*  --   --   --   --  1.7 2.3   Liver Function Tests: No results for input(s): AST, ALT, ALKPHOS, BILITOT, PROT, ALBUMIN in the last 168 hours. No results for input(s): LIPASE, AMYLASE in the last 168 hours. No results for input(s): AMMONIA in the last 168 hours. CBC: Recent Labs  Lab 08/06/20 0655 08/06/20 1317 08/08/20 0526 08/08/20 1156 08/08/20 1201  WBC 7.3 6.5 8.3  --   --   HGB 12.7* 13.0 12.3* 12.9* 11.9*   12.9*  HCT 39.7 38.9* 35.1* 38.0* 35.0*   38.0*  MCV 100.3* 100.5* 94.9  --   --   PLT 144* 63* 153  --   --    Cardiac Enzymes: No results for input(s): CKTOTAL, CKMB, CKMBINDEX, TROPONINI in the last 168 hours. BNP: Invalid input(s): POCBNP CBG: Recent Labs  Lab 08/06/20 2107 08/07/20 0633  GLUCAP 132* 98   D-Dimer No results for input(s): DDIMER in the last 72 hours. Hgb A1c No results for input(s): HGBA1C in the last 72 hours. Lipid Profile No results for input(s): CHOL, HDL, LDLCALC,  TRIG, CHOLHDL, LDLDIRECT in the last 72 hours. Thyroid function studies No results for input(s): TSH, T4TOTAL, T3FREE, THYROIDAB in the last 72 hours.  Invalid input(s): FREET3 Anemia work up No results for input(s): VITAMINB12, FOLATE, FERRITIN, TIBC, IRON, RETICCTPCT in the last 72 hours. Urinalysis No results found for: COLORURINE, APPEARANCEUR, [place], [place], GLUCOSEU, [place], [place], [place], PROTEINUR, UROBILINOGEN, NITRITE, LEUKOCYTESUR Sepsis Labs Invalid input(s): PROCALCITONIN,  WBC,  LACTICIDVEN Microbiology Recent Results (from the past 240 hour(s))  Resp Panel by RT-PCR (Flu A&B, Covid) Nasopharyngeal Swab     Status: None   Collection Time: 08/06/20  9:56 AM   Specimen: Nasopharyngeal Swab; Nasopharyngeal(NP) swabs in vial transport medium  Result Value Ref Range Status   SARS Coronavirus 2 by RT PCR NEGATIVE NEGATIVE Final    Comment: (NOTE) SARS-CoV-2 target nucleic acids are NOT DETECTED.  The SARS-CoV-2 RNA is generally detectable in upper respiratory specimens during the acute phase of infection. The lowest concentration of SARS-CoV-2 viral copies this assay can detect is 138 copies/mL. A negative result does not preclude SARS-Cov-2 infection and should not be used as the sole basis for treatment or other patient management decisions. A negative result may occur with  improper specimen collection/handling, submission of specimen other than nasopharyngeal swab, presence of viral mutation(s) within the areas targeted by this assay, and inadequate number of viral copies(<138 copies/mL). A negative result must be combined with clinical observations, patient history, and epidemiological information. The expected result is Negative.  Fact Sheet for Patients:  EntrepreneurPulse.com.au  Fact Sheet for Healthcare Providers:  IncredibleEmployment.be  This test is no t yet approved or cleared by the [place] FDA and   has been authorized for detection and/or diagnosis of SARS-CoV-2 by FDA under an Emergency Use Authorization (EUA). This EUA will remain  in effect (meaning this test can be used) for the duration of the COVID-19 declaration under Section 564(b)(1) of the Act, 21 U.S.C.section 360bbb-3(b)(1), unless the authorization is terminated  or revoked sooner.       Influenza A by PCR NEGATIVE NEGATIVE Final   Influenza B by PCR NEGATIVE NEGATIVE Final    Comment: (NOTE) The Xpert Xpress SARS-CoV-2/FLU/RSV plus assay is intended as an aid in the diagnosis of influenza from Nasopharyngeal swab specimens and should not be used as a sole basis for treatment. Nasal washings and aspirates are unacceptable for Xpert Xpress SARS-CoV-2/FLU/RSV testing.  Fact Sheet for Patients: EntrepreneurPulse.com.au  Fact Sheet for Healthcare Providers: IncredibleEmployment.be  This test is not yet approved or cleared by the [place] FDA and has been authorized for detection and/or diagnosis of SARS-CoV-2 by FDA under an Emergency Use Authorization (EUA). This EUA will remain in effect (meaning this test can be used) for the duration of the COVID-19 declaration under Section 564(b)(1) of the Act, 21 U.S.C. section 360bbb-3(b)(1), unless the authorization is terminated or revoked.  Performed at [facility], [place] [address] [place], [place] [address]      Time coordinating discharge: Over 40 minutes  SIGNED:   Charlynne Cousins, MD  [facility] 08/10/2020, 11:33 AM Pager   If 7PM-7AM, please contact night-coverage [email or web address] Password [email or web address]

## 2020-08-10 NOTE — Progress Notes (Signed)
[facility] PROGRESS NOTE    Progress Note  [name]  MOL:[id number] DOB: 1946/07/18 DOA: 08/06/2020 PCP: Linus Mako, NP     Brief Narrative:   [name] is an 74 y.o. male past medical history significant of ischemic cardiomyopathy with an EF of 25%, chronic kidney disease stage IIIa, PAD essential hypertension comes in with worsening shortness of breath and left-sided chest pain Previous 2D echo showed an EF of 20% Cardiac cath in 2007 showed nonobstructive disease. Chest x-ray on admission showed pulmonary edema with a BNP of greater than 4500.   Assessment/Plan:   Acute respiratory failure with hypoxia 2/2 Acute on chronic HFrEF (heart failure with reduced ejection fraction) ([place]): Left and right heart cath scheduled for 08/08/2020 that showed moderate nonobstructive CAD, severe nonischemic cardiomyopathy with an EF around 15%, elevated filling pressures and cardiac index 1.9. Cardiac MRI pending. Currently on Entresto, Toprol, Imdur and Aldactone.  Farxiga added. Off Lasix creatinine stable. Advanced heart failure team is on board, further management per them. Redc clip 30% on 08/09/2020 I of diuretics creatinine is slowly improving. Outpatient sleep study.  Chest pain with mild elevation of troponins: Pain is not typical cardiac biomarkers remain flat in the setting of heart failure and chronic renal disease.  Stage IIIa chronic kidney disease: With a baseline creatinine 1.4-1.5 Continue  Aldactone per cards. Lasix has been held his creatinine has remained stable.  PVCs/possibly monomorphic V. tach : Multiple episodes of what appears to be monomorphic V. tach on telemetry, he is currently on metoprolol. We will discuss with cards if EP needs to get involved.  Essential hypertension: Currently on Entresto Toprol and Aldactone.. Blood pressure is improved.  Bilateral pulmonary nodules: Bilateral nodules on CT on 11/21, which have been stable  since 2007. Repeat CT of the chest in 3 to 6 months.   DVT prophylaxis: lovenox Family Communication:None Status is: Inpatient  Remains inpatient appropriate because:Hemodynamically unstable   Dispo: The patient is from: Home              Anticipated d/c is to: Home              Anticipated d/c date is: > 3 days              Patient currently is not medically stable to d/c.  Code Status:     Code Status Orders  (From admission, onward)         Start     Ordered   08/06/20 1319  Full code  Continuous        08/06/20 1321        Code Status History    Date Active Date Inactive Code Status Order ID Comments User Context   07/01/2020 0211 07/02/2020 2338 Full Code [id number]  Vianne Bulls, MD ED   Advance Care Planning Activity        IV Access:    Peripheral IV   Procedures and diagnostic studies:   CARDIAC CATHETERIZATION  Result Date: 08/08/2020  Prox RCA to Mid RCA lesion is 50% stenosed.  Ost RCA to Prox RCA lesion is 30% stenosed.  Dist RCA lesion is 30% stenosed.  Prox Cx lesion is 30% stenosed.  Mid Cx to Dist Cx lesion is 30% stenosed.  Prox LAD lesion is 30% stenosed.  Mid LAD lesion is 40% stenosed.  2nd Diag-1 lesion is 50% stenosed.  2nd Diag-2 lesion is 70% stenosed.  Findings: Ao = 110/67 (85) LV = 109/20 RA =  8 RV = 49/14 PA = 51/25 (37) PCW = 25 (v = 31) Fick cardiac output/index = 3.6/1.9 PVR = 3.4 WU SVR = 1736 FA sat = 98% PA sat = 57%. 57% Assessment: 1. Moderate non-obstructive CAD 2. Severe NICM EF 15% 3. Elevated filling pressures with low output Plan/Discussion: He has severe LV dysfunction due to NICM. Will get cMRI to further evaluate. If renal function stable post cat will resume diuresis as tolerated. May need milrinone support to diurese further. Glori Bickers, MD 12:30 PM     Medical Consultants:    None.  Anti-Infectives:   none  Subjective:    [name] a lot better not short of breath denies chest  pain.  Objective:    Vitals:   08/09/20 1145 08/09/20 2225 08/10/20 0032 08/10/20 0711  BP: 106/76 106/68  115/83  Pulse: 61 (!) 59  (!) 58  Resp: 18 20  20   Temp: (!) 97.5 F (36.4 C) 98.7 F (37.1 C)  97.7 F (36.5 C)  TempSrc: Oral Oral  Oral  SpO2: 94% 98%  100%  Weight:   63.1 kg   Height:       SpO2: 100 % O2 Flow Rate (L/min): 2 L/min FiO2 (%): 28 %   Intake/Output Summary (Last 24 hours) at 08/10/2020 0933 Last data filed at 08/10/2020 0600 Gross per 24 hour  Intake 574.96 ml  Output 1075 ml  Net -500.04 ml   Filed Weights   08/08/20 0511 08/09/20 0507 08/10/20 0032  Weight: 63.5 kg 63.7 kg 63.1 kg    Exam: General exam: In no acute distress. Respiratory system: Good air movement and clear to auscultation. Cardiovascular system: S1 & S2 heard, RRR.  Positive JVD. Gastrointestinal system: Abdomen is nondistended, soft and nontender.  Extremities: No pedal edema. Skin: No rashes, lesions or ulcers   Data Reviewed:    Labs: Basic Metabolic Panel: Recent Labs  Lab 08/06/20 1630 08/07/20 0354 08/08/20 0526 08/08/20 1156 08/08/20 1201 08/08/20 1904 08/09/20 0318 08/10/20 0347  NA  --  141 136 140 144   140 137 135 139  K  --  3.9 3.6 3.4* 2.8*   3.3* 3.8 3.5 4.0  CL  --  106 99  --   --  101 102 104  CO2  --  24 26  --   --  25 23 26   GLUCOSE  --  113* 98  --   --  114* 98 104*  BUN  --  26* 31*  --   --  31* 28* 26*  CREATININE  --  1.67* 1.62*  --   --  1.64* 1.45* 1.47*  CALCIUM  --  9.3 9.0  --   --  8.8* 8.6* 8.7*  MG 1.6* 2.5*  --   --   --   --  1.7 2.3   GFR Estimated Creatinine Clearance: 39.3 mL/min (A) (by C-G formula based on SCr of 1.47 mg/dL (H)). Liver Function Tests: No results for input(s): AST, ALT, ALKPHOS, BILITOT, PROT, ALBUMIN in the last 168 hours. No results for input(s): LIPASE, AMYLASE in the last 168 hours. No results for input(s): AMMONIA in the last 168 hours. Coagulation profile Recent Labs  Lab  08/06/20 0655  INR 1.3*   COVID-19 Labs  No results for input(s): DDIMER, FERRITIN, LDH, CRP in the last 72 hours.  Lab Results  Component Value Date   SARSCOV2NAA NEGATIVE 08/06/2020   [place] NEGATIVE 06/30/2020    CBC: Recent  Labs  Lab 08/06/20 0655 08/06/20 1317 08/08/20 0526 08/08/20 1156 08/08/20 1201  WBC 7.3 6.5 8.3  --   --   HGB 12.7* 13.0 12.3* 12.9* 11.9*   12.9*  HCT 39.7 38.9* 35.1* 38.0* 35.0*   38.0*  MCV 100.3* 100.5* 94.9  --   --   PLT 144* 63* 153  --   --    Cardiac Enzymes: No results for input(s): CKTOTAL, CKMB, CKMBINDEX, TROPONINI in the last 168 hours. BNP (last 3 results) No results for input(s): PROBNP in the last 8760 hours. CBG: Recent Labs  Lab 08/06/20 2107 08/07/20 0633  GLUCAP 132* 98   D-Dimer: No results for input(s): DDIMER in the last 72 hours. Hgb A1c: No results for input(s): HGBA1C in the last 72 hours. Lipid Profile: No results for input(s): CHOL, HDL, LDLCALC, TRIG, CHOLHDL, LDLDIRECT in the last 72 hours. Thyroid function studies: No results for input(s): TSH, T4TOTAL, T3FREE, THYROIDAB in the last 72 hours.  Invalid input(s): FREET3 Anemia work up: No results for input(s): VITAMINB12, FOLATE, FERRITIN, TIBC, IRON, RETICCTPCT in the last 72 hours. Sepsis Labs: Recent Labs  Lab 08/06/20 0655 08/06/20 1317 08/06/20 2026 08/06/20 2236 08/08/20 0526  WBC 7.3 6.5  --   --  8.3  LATICACIDVEN  --   --  2.9* 1.8  --    Microbiology Recent Results (from the past 240 hour(s))  Resp Panel by RT-PCR (Flu A&B, Covid) Nasopharyngeal Swab     Status: None   Collection Time: 08/06/20  9:56 AM   Specimen: Nasopharyngeal Swab; Nasopharyngeal(NP) swabs in vial transport medium  Result Value Ref Range Status   SARS Coronavirus 2 by RT PCR NEGATIVE NEGATIVE Final    Comment: (NOTE) SARS-CoV-2 target nucleic acids are NOT DETECTED.  The SARS-CoV-2 RNA is generally detectable in upper respiratory specimens during the  acute phase of infection. The lowest concentration of SARS-CoV-2 viral copies this assay can detect is 138 copies/mL. A negative result does not preclude SARS-Cov-2 infection and should not be used as the sole basis for treatment or other patient management decisions. A negative result may occur with  improper specimen collection/handling, submission of specimen other than nasopharyngeal swab, presence of viral mutation(s) within the areas targeted by this assay, and inadequate number of viral copies(<138 copies/mL). A negative result must be combined with clinical observations, patient history, and epidemiological information. The expected result is Negative.  Fact Sheet for Patients:  EntrepreneurPulse.com.au  Fact Sheet for Healthcare Providers:  IncredibleEmployment.be  This test is no t yet approved or cleared by the [place] FDA and  has been authorized for detection and/or diagnosis of SARS-CoV-2 by FDA under an Emergency Use Authorization (EUA). This EUA will remain  in effect (meaning this test can be used) for the duration of the COVID-19 declaration under Section 564(b)(1) of the Act, 21 U.S.C.section 360bbb-3(b)(1), unless the authorization is terminated  or revoked sooner.       Influenza A by PCR NEGATIVE NEGATIVE Final   Influenza B by PCR NEGATIVE NEGATIVE Final    Comment: (NOTE) The Xpert Xpress SARS-CoV-2/FLU/RSV plus assay is intended as an aid in the diagnosis of influenza from Nasopharyngeal swab specimens and should not be used as a sole basis for treatment. Nasal washings and aspirates are unacceptable for Xpert Xpress SARS-CoV-2/FLU/RSV testing.  Fact Sheet for Patients: EntrepreneurPulse.com.au  Fact Sheet for Healthcare Providers: IncredibleEmployment.be  This test is not yet approved or cleared by the [place] FDA and has been  authorized for detection and/or  diagnosis of SARS-CoV-2 by FDA under an Emergency Use Authorization (EUA). This EUA will remain in effect (meaning this test can be used) for the duration of the COVID-19 declaration under Section 564(b)(1) of the Act, 21 U.S.C. section 360bbb-3(b)(1), unless the authorization is terminated or revoked.  Performed at [facility], [place] [address] [place], [place] [address]      Medications:    aspirin EC  81 mg Oral Daily   atorvastatin  40 mg Oral Daily   cholecalciferol  1,000 Units Oral BID   dapagliflozin propanediol  10 mg Oral Daily   enoxaparin (LOVENOX) injection  40 mg Subcutaneous Q24H   hydrALAZINE  12.5 mg Oral Q8H   isosorbide mononitrate  15 mg Oral Daily   metoprolol succinate  50 mg Oral Daily   sacubitril-valsartan  1 tablet Oral BID   sodium chloride flush  3 mL Intravenous Q12H   sodium chloride flush  3 mL Intravenous Q12H   spironolactone  25 mg Oral Daily   Continuous Infusions:  sodium chloride        LOS: 4 days   Charlynne Cousins  [facility]  08/10/2020, 9:33 AM

## 2020-08-10 NOTE — Progress Notes (Signed)
Pt requested flu shot prior to discharge then refused vaccination in order to be discharged sooner.

## 2020-08-10 NOTE — Progress Notes (Signed)
Advanced Heart Failure Rounding Note  PCP-Cardiologist: No primary care provider on file.   Subjective:    NICM. R/LHC 12/16 w/ moderate non-obstructive CAD and elevated filling pressure and low output. CI 1.9  cMRI results pending   Feels good. Wants to go home. No CP, SOB, orthopnea or PND.   Objective:   Weight Range: 63.1 kg Body mass index is 19.41 kg/m.   Vital Signs:   Temp:  [97.5 F (36.4 C)-98.7 F (37.1 C)] 97.7 F (36.5 C) (12/18 0711) Pulse Rate:  [58-61] 58 (12/18 0711) Resp:  [18-20] 20 (12/18 0711) BP: (106-115)/(68-83) 115/83 (12/18 0711) SpO2:  [94 %-100 %] 100 % (12/18 0711) Weight:  [63.1 kg] 63.1 kg (12/18 0032) Last BM Date: 08/08/20  Weight change: Filed Weights   08/08/20 0511 08/09/20 0507 08/10/20 0032  Weight: 63.5 kg 63.7 kg 63.1 kg    Intake/Output:   Intake/Output Summary (Last 24 hours) at 08/10/2020 0906 Last data filed at 08/10/2020 0600 Gross per 24 hour  Intake 577.96 ml  Output 1075 ml  Net -497.04 ml      Physical Exam    General:  Lying in bed. No resp difficulty HEENT: normal x for R eye cataracts Neck: supple. JVP 7-8 Carotids 2+ bilat; no bruits. No lymphadenopathy or thryomegaly appreciated. Cor: PMI nondisplaced. Regular rate & rhythm. No rubs, gallops or murmurs. Lungs: clear Abdomen: soft, nontender, nondistended. No hepatosplenomegaly. No bruits or masses. Good bowel sounds. Extremities: no cyanosis, clubbing, rash, edema Neuro: alert & orientedx3, cranial nerves grossly intact. moves all 4 extremities w/o difficulty. Affect pleasant    Telemetry   NSR w occasional brief NSVT Personally reviewed  Labs    CBC Recent Labs    08/08/20 0526 08/08/20 1156 08/08/20 1201  WBC 8.3  --   --   HGB 12.3* 12.9* 11.9*   12.9*  HCT 35.1* 38.0* 35.0*   38.0*  MCV 94.9  --   --   PLT 153  --   --    Basic Metabolic Panel Recent Labs    08/09/20 0318 08/10/20 0347  NA 135 139  K 3.5 4.0  CL 102 104   CO2 23 26  GLUCOSE 98 104*  BUN 28* 26*  CREATININE 1.45* 1.47*  CALCIUM 8.6* 8.7*  MG 1.7 2.3   Liver Function Tests No results for input(s): AST, ALT, ALKPHOS, BILITOT, PROT, ALBUMIN in the last 72 hours. No results for input(s): LIPASE, AMYLASE in the last 72 hours. Cardiac Enzymes No results for input(s): CKTOTAL, CKMB, CKMBINDEX, TROPONINI in the last 72 hours.  BNP: BNP (last 3 results) Recent Labs    06/30/20 2354 08/06/20 0941  BNP 3,205.6* >4,500.0*    ProBNP (last 3 results) No results for input(s): PROBNP in the last 8760 hours.   D-Dimer No results for input(s): DDIMER in the last 72 hours. Hemoglobin A1C No results for input(s): HGBA1C in the last 72 hours. Fasting Lipid Panel No results for input(s): CHOL, HDL, LDLCALC, TRIG, CHOLHDL, LDLDIRECT in the last 72 hours. Thyroid Function Tests No results for input(s): TSH, T4TOTAL, T3FREE, THYROIDAB in the last 72 hours.  Invalid input(s): FREET3  Other results:   Imaging    No results found.   Medications:     Scheduled Medications:  aspirin EC  81 mg Oral Daily   atorvastatin  40 mg Oral Daily   cholecalciferol  1,000 Units Oral BID   dapagliflozin propanediol  10 mg Oral Daily  enoxaparin (LOVENOX) injection  40 mg Subcutaneous Q24H   hydrALAZINE  12.5 mg Oral Q8H   isosorbide mononitrate  15 mg Oral Daily   metoprolol succinate  50 mg Oral Daily   sacubitril-valsartan  1 tablet Oral BID   sodium chloride flush  3 mL Intravenous Q12H   sodium chloride flush  3 mL Intravenous Q12H   spironolactone  25 mg Oral Daily    Infusions:  sodium chloride      PRN Medications: sodium chloride, acetaminophen, HYDROcodone-acetaminophen, ondansetron (ZOFRAN) IV, sodium chloride flush    Patient Profile  74 y/o male with HTN, PAD. CKD 3a and chronic systolic HF due to presumed NICM EF 25%.   Admitted with ADHF.   Assessment/Plan   1. Acute on Chronic Combined Systolic and  Diastolic Biventricular Heart Failure - CMP dates back to at least 2007, EF chronically in 20-35% range. Reported as NICM (notes indicate nonobstructive CAD on cath in 2007, study report not on file) - Most recent echo 11/21 LVEF 20-25%, G2DD, diffuse HK, moderately reduced RV - This is 2nd admit for a/c CHF in 6 weeks.  - BNP >4,500 on admit. CXR w/ pulmonary edema.  - NICM. R/LHC w/ mod no-obs CAD, elevated filling pressures and CI 1.9  - Volume status optimized. ReDs Clip 30% on 12/17 - Continue Entresto 97-103 - Continue Toprol XL 50 mg daily  - Continue hydralazine 12.5 mg three times a day + 15 mg imdur daily.  - Continue Spiro 25 mg daily  - Continue Farxiga 10 mg daily (hgb A1c 6.1) - cRMI completed 12/16 - results pending - May need eventual ICD if prognosis permits - Consider outpatient sleep study   2. Chest Pain w/ Mildly Elevated Troponin  - Hs trop flat and low level, 53>>58, - LHC this admit w/ mod non-obs CAD - Continue ASA +  blocker - LDL ok  At 66 but will add statin for plaque stabilization, atorvastatin 40   3. Stage IIIa CKD - Baseline SCr ~1.4-1.5 - 1.42 on admit-->1.6-->1.45 - Farxiga started - Follow BMP   4. PVCs/NSVT - < 10/hr on tele  - cath w/ non obs CAD  - Continue Toprol 50 mg daily -  TSH nl  - Keep K > 4.0 and Mg >2.0 - Outpatient sleep study to r/o OSA - W/ chronically low EF, will need EP evaluation for potential ICD if prognosis permits  5. PAD - s/p bifem bypass and aorto-iliac aneursym repair at [facility]  - denies claudication  - continue ASA  - added statin, atorvastatin 29    Ok for d/c home from HF perspective on above meds  Entresto 97-103 Toprol XL 50 mg daily  Hydralazine 12.5 mg three times a day + 15 mg imdur daily. (new) Spiro 25 mg daily  Farxiga 10 mg daily  (new) Atorva 40 daily  (new) Lasix 40 mg daily (increased from 20) Kdur 20 dialy (new)  Will see back in HF Clinic this week. Would likely benefit  from Paramedicine.    Length of Stay: [place], MD  08/10/2020, 9:06 AM  Advanced Heart Failure Team Pager [phone] (M-F; 7a - 4p)  Please contact [facility] for night-coverage after hours (4p -7a ) and weekends on [email or web address]

## 2020-08-22 ENCOUNTER — Encounter: Payer: Self-pay | Admitting: *Deleted

## 2020-08-22 ENCOUNTER — Other Ambulatory Visit: Payer: Self-pay | Admitting: *Deleted

## 2020-08-22 NOTE — Patient Outreach (Signed)
[facility]) Care Management  08/22/2020  [name] January 31, 1946 [id number]   [facility] outreach for EMMI-general discharge Discharge from [facility] on [place] #    4       Date: Thursday 08/15/20 1003  Red Alert Reason: Know who to call about changes in condition? No  scheduled follow-up? No  Other questions/problems? yes  Insurance: [employer] Medicare VA benefit [facility] admissions x 2 ED visits x 2 in the last 6 months  Last admission 08/06/20 to 08/10/20 Hypertension (HTN), monomorphic ventricular tachycardia, Chronic Kidney disease (CKD)stage 3 a, acute on chronic heart failure atypical chest pain, acute respiratory failure with hypoxia secondary to heart failure   Outreach attempt # 1 successful to [phone] Patient is able to verify HIPAA identifiers [facility] Care Management RN reviewed and addressed red alert with patient  Consent: [facility] RN CM reviewed [facility] services with patient. Patient gave verbal consent for services [facility] telephonic RN CM.  Advised patient that there will be further automated EMMI- post discharge calls to assess how the patient is doing following the recent hospitalization Advised the patient that another call may be received from a nurse if any of their responses were abnormal. Patient voiced understanding and was appreciative of f/u call.  EMMI:  Know who to call about changes in condition? And scheduled follow-up? No [name] reports the EMMI answer is incorrect He confirms he has an appointment on 08/28/20 to re establish local primary care provider (PCP) care with [facility]. He reports he was going to [facility] ([place]) for services to see Riccardo Dubin He also has a follow up with his cardiology Hochrein on 08/30/20 and an appointment on 09/02/20 with cardiology Other questions/problems? yes His questions are related to symptoms of blurred vision that he reports he has noted since starting one of his new  medications ( atorvastatin, farxiga, Imdur, or aldactone) upon hospital discharge He denies having dizziness, or other symptoms Just blurred vision [facility] RN CM encouraged [name] to monitor his blood pressure (BP) and document it for at least 3 days prior to him taking any of his BP medicine to assess for hypotension episodes. Education provided on hypotension symptoms, falls, safety Encouraged him to take this documentation to his 08/28/20 MD visit    Social: [name] is a 74 year old single male who lives alone but with the support of his daughter Aymaan Gunsallus. He is doing well with his care needs  He works in [employer] part time. He reports he has been out of work for 3 weeks and is eager to return to work after being seen by his medical providers   Past Medical History:  Diagnosis Date  . ABDOMINAL AORTIC ANEURYSM   . CAD    Non obstructive (mild) cath 2007  . CHF    EF 15% in 2007, 25% currently  . GLAUCOMA   . HYPERTENSION   . TOBACCO ABUSE    Previous   Chronic Kidney disease (CKD) stage III a   PAD (Peripheral disease)  Current Outpatient Medications on File Prior to Visit  Medication Sig Dispense Refill  . aspirin 81 MG tablet Take 81 mg by mouth daily.    Marland Kitchen atorvastatin (LIPITOR) 40 MG tablet Take 1 tablet (40 mg total) by mouth daily. 30 tablet 0  . cholecalciferol (VITAMIN D) 1000 UNITS tablet Take 1,000 Units by mouth 2 (two) times daily.     Marland Kitchen  dapagliflozin propanediol (FARXIGA) 10 MG TABS tablet Take 1 tablet (10 mg total) by mouth daily. 30 tablet 0  . furosemide (LASIX) 40 MG tablet Take 1 tablet (40 mg total) by mouth daily. 30 tablet 11  . hydrALAZINE (APRESOLINE) 25 MG tablet Take 0.5 tablets (12.5 mg total) by mouth every 8 (eight) hours. 30 tablet 0  . isosorbide mononitrate (IMDUR) 30 MG 24 hr tablet Take 0.5 tablets (15 mg total) by mouth daily. 30 tablet 0  . metoprolol succinate (TOPROL-XL) 100 MG 24 hr tablet Take 50 mg by mouth daily. Take  with or immediately following a meal.    . potassium chloride (KLOR-CON) 10 MEQ tablet Take 2 tablets (20 mEq total) by mouth daily. 30 tablet 0  . sacubitril-valsartan (ENTRESTO) 97-103 MG Take 1 tablet by mouth 2 (two) times daily. 180 tablet 3  . spironolactone (ALDACTONE) 25 MG tablet Take 1 tablet (25 mg total) by mouth daily. 30 tablet 3   No current facility-administered medications on file prior to visit.    DME: BP cuff eyeglasses    Plan: Patient agrees to the care plan and follow up [facility] RN CM will follow up with Robert Burkland as agreed within the next 7-14 business days Pt encouraged to return a call to [facility] RN CM prn- [facility] RN CM number provided Goals Addressed              This Visit's Progress     Patient Stated   .  [facility]) Track and Manage My Blood Pressure-Hypertension (pt-stated)   On track     Timeframe:  Short-Term Goal Priority:  Medium Start Date:            08/22/20                 Expected End Date:   09/23/20                    Follow Up Date 08/30/20   - check blood pressure 3 times per week - choose a place to take my blood pressure (home, clinic or office, retail store) - write blood pressure results in a log or diary    Notes: 08/22/20 symptoms of blurred vision that he reports he has noted since starting one of his new medications ( atorvastatin, farxiga, Imdur, or aldactone) upon hospital discharge He denies having dizziness, or other symptoms Just blurred vision        Kabe Mckoy L. Noelle Penner, RN, BSN, CCM [name] Telephonic Care Management Care Coordinator Office number [phone] Mobile number [phone]  Main [facility] number [phone] Fax number [phone]

## 2020-08-28 ENCOUNTER — Ambulatory Visit: Payer: Medicare Other | Admitting: Medical

## 2020-08-29 NOTE — Progress Notes (Signed)
Cardiology Office Note   Date:  08/30/2020   ID:  [name], DOB 1945-12-04, MRN [id number]  PCP:  Konrad Felix, NP  Cardiologist:   No primary care provider on file.   Chief Complaint  Patient presents with  . Congestive Heart Failure      History of Present Illness: [name] is a 75 y.o. male who presents for followup of a nonischemic cardiomyopathy EF was 25%.    Since I last saw him he was admitted twice with HF.  I reviewed these records for this visit.   He was seen by advanced HF.  He is to be considered for sleep study, CardioMEMS and eventual ICD.  Was started on Farxiga.  He had a cardiac cath in Dec with non obstructive CAD.   MRI was suggestive of sarcoidosis.     He reports that he was feeling well prior to presentations when he would suddenly get short of breath.  He does not report missing his medications.  He does not report increased fluid intake.  He does not describe salt indiscretion.  Since going home he says he is feeling well.  He is not working.  He does go to [employer].  With that he does not bring on any shortness of breath, palpitations, presyncope or syncope.  He has no chest pressure, neck or arm discomfort.  He is not having any PND or orthopnea.  He has had no weight gain or edema.   Past Medical History:  Diagnosis Date  . ABDOMINAL AORTIC ANEURYSM   . CAD    Non obstructive (mild) cath 2007  . CHF    EF 15% in 2007, 25% currently  . GLAUCOMA   . HYPERTENSION   . TOBACCO ABUSE    Previous    Past Surgical History:  Procedure Laterality Date  . Clavical Repair    . CORNEAL TRANSPLANT     Right eye x 2  . EYE SURGERY     Glaucoma  . RIGHT/LEFT HEART CATH AND CORONARY ANGIOGRAPHY N/A 08/08/2020   Procedure: RIGHT/LEFT HEART CATH AND CORONARY ANGIOGRAPHY;  Surgeon: Dolores Patty, MD;  Location: [facility];  Service: Cardiovascular;  Laterality: N/A;     Current Outpatient Medications  Medication Sig  Dispense Refill  . aspirin 81 MG tablet Take 81 mg by mouth daily.    Marland Kitchen atorvastatin (LIPITOR) 40 MG tablet Take 1 tablet (40 mg total) by mouth daily. 30 tablet 0  . cholecalciferol (VITAMIN D) 1000 UNITS tablet Take 1,000 Units by mouth 2 (two) times daily.     . dapagliflozin propanediol (FARXIGA) 10 MG TABS tablet Take 1 tablet (10 mg total) by mouth daily. 30 tablet 0  . furosemide (LASIX) 40 MG tablet Take 1 tablet (40 mg total) by mouth daily. 30 tablet 11  . hydrALAZINE (APRESOLINE) 25 MG tablet Take 0.5 tablets (12.5 mg total) by mouth every 8 (eight) hours. 30 tablet 0  . isosorbide mononitrate (IMDUR) 30 MG 24 hr tablet Take 0.5 tablets (15 mg total) by mouth daily. 30 tablet 0  . metoprolol succinate (TOPROL-XL) 100 MG 24 hr tablet Take 50 mg by mouth daily. Take with or immediately following a meal.    . potassium chloride (KLOR-CON) 10 MEQ tablet Take 2 tablets (20 mEq total) by mouth daily. 30 tablet 0  . potassium chloride (KLOR-CON) 10 MEQ tablet Take 20 mEq by mouth daily.    . sacubitril-valsartan (ENTRESTO) 97-103 MG Take  1 tablet by mouth 2 (two) times daily. 180 tablet 3  . spironolactone (ALDACTONE) 25 MG tablet Take 1 tablet (25 mg total) by mouth daily. 30 tablet 3   No current facility-administered medications for this visit.    Allergies:   Patient has no known allergies.    ROS:  Please see the history of present illness.   Otherwise, review of systems are positive for none.   All other systems are reviewed and negative.    PHYSICAL EXAM: VS:  BP 96/62 (BP Location: Left Arm, Patient Position: Sitting, Cuff Size: Normal)   Pulse (!) 59   Ht 5\' 11"  (1.803 m)   Wt 145 lb (65.8 kg)   BMI 20.22 kg/m  , BMI Body mass index is 20.22 kg/m.  GENERAL:  Well appearing NECK:  No jugular venous distention, waveform within normal limits, carotid upstroke brisk and symmetric, no bruits, no thyromegaly LUNGS:  Clear to auscultation bilaterally CHEST:   Unremarkable HEART:  PMI not displaced or sustained,S1 and S2 within normal limits, no S3, no S4, no clicks, no rubs, no murmurs ABD:  Flat, positive bowel sounds normal in frequency in pitch, no bruits, no rebound, no guarding, no midline pulsatile mass, no hepatomegaly, no splenomegaly EXT:  2 plus pulses upper, decreased DP/PT bilateral lower, no edema, no cyanosis no clubbing  EKG:  EKG is  ordered today. The ekg ordered today demonstrates sinus rhythm, rate 59 right bundle branch block, left anterior fascicular block, premature ventricular contraction, first-degree AV block   Recent Labs: 08/06/2020: B Natriuretic Peptide >4,500.0; TSH 1.224 08/08/2020: Hemoglobin 12.9; Hemoglobin 11.9; Platelets 153 08/10/2020: BUN 26; Creatinine, Ser 1.47; Magnesium 2.3; Potassium 4.0; Sodium 139      Wt Readings from Last 3 Encounters:  08/30/20 145 lb (65.8 kg)  08/10/20 139 lb 3.2 oz (63.1 kg)  07/02/20 137 lb 5.6 oz (62.3 kg)      Other studies Reviewed: Additional studies/ records that were reviewed today include:  Hospital records. Review of the above records demonstrates:  See elsewhere   ASSESSMENT AND PLAN:  CARDIOMOYPATHY -  MRI demonstrated possible sarcoid.    This would likely be end-stage without probable reversible response to steroids but I will defer to Dr. Haroldine Laws to further work-up.   He also has Advanced HF follow up next week to consider further device therapy at sleep apnea work-up.Marland Kitchen  His blood pressure will not allow med titration.  He actually feels well.  No change in therapy.   PVD -  He has no active claudication.  He is followed at the [facility] for this.   HYPERTENSION -  This is managed in the context of treating his cardiomyopathy.  His blood pressure is running low today.   HYPERLIPIDEMIA - LDL was 66 in the hospital.  No change in therapy.  BRADYCARDIA -  He had significant conduction disturbance but has had no symptomatic bradycardia arrhythmias.  No  change in therapy.   TOBACCO - He has not been smoking since the hospital.   AAA - He has a small infrarenal AAA.   No further imaging is indicated at this point.     Current medicines are reviewed at length with the patient today.  The patient does not have concerns regarding medicines.  The following changes have been made:  no change  Labs/ tests ordered today include: None  Orders Placed This Encounter  Procedures  . EKG 12-Lead     Disposition:    I am  happy to resume his care after he has been seen by the [facility].    Signed, Minus Breeding, MD  08/30/2020 5:48 PM    [facility]

## 2020-08-30 ENCOUNTER — Encounter: Payer: Self-pay | Admitting: Cardiology

## 2020-08-30 ENCOUNTER — Ambulatory Visit (INDEPENDENT_AMBULATORY_CARE_PROVIDER_SITE_OTHER): Payer: Medicare Other | Admitting: Cardiology

## 2020-08-30 ENCOUNTER — Other Ambulatory Visit: Payer: Self-pay | Admitting: *Deleted

## 2020-08-30 ENCOUNTER — Encounter: Payer: Self-pay | Admitting: *Deleted

## 2020-08-30 ENCOUNTER — Other Ambulatory Visit: Payer: Self-pay

## 2020-08-30 VITALS — BP 96/62 | HR 59 | Ht 71.0 in | Wt 145.0 lb

## 2020-08-30 DIAGNOSIS — I714 Abdominal aortic aneurysm, without rupture, unspecified: Secondary | ICD-10-CM

## 2020-08-30 DIAGNOSIS — R001 Bradycardia, unspecified: Secondary | ICD-10-CM | POA: Diagnosis not present

## 2020-08-30 DIAGNOSIS — I10 Essential (primary) hypertension: Secondary | ICD-10-CM | POA: Diagnosis not present

## 2020-08-30 DIAGNOSIS — I5023 Acute on chronic systolic (congestive) heart failure: Secondary | ICD-10-CM

## 2020-08-30 DIAGNOSIS — E785 Hyperlipidemia, unspecified: Secondary | ICD-10-CM | POA: Diagnosis not present

## 2020-08-30 NOTE — Patient Instructions (Signed)
Medication Instructions:  Your physician recommends that you continue on your current medications as directed. Please refer to the Current Medication list given to you today.   *If you need a refill on your cardiac medications before your next appointment, please call your pharmacy*  Lab Work: NONE   Testing/Procedures: NONE   Follow-Up: At [facility], you and your health needs are our priority.  As part of our continuing mission to provide you with exceptional heart care, we have created designated Provider Care Teams.  These Care Teams include your primary Cardiologist (physician) and Advanced Practice Providers (APPs -  Physician Assistants and Nurse Practitioners) who all work together to provide you with the care you need, when you need it.  We recommend signing up for the patient portal called "MyChart".  Sign up information is provided on this After Visit Summary.  MyChart is used to connect with patients for Virtual Visits (Telemedicine).  Patients are able to view lab/test results, encounter notes, upcoming appointments, etc.  Non-urgent messages can be sent to your provider as well.   To learn more about what you can do with MyChart, go to [email or web address].    Your next appointment:   Keep your appointment at [facility]

## 2020-08-30 NOTE — Patient Outreach (Signed)
[name]) Care Best  08/30/2020  [name] 02-22-1946 [id number]   [facility] outreach follow up for EMMI-general discharge [facility] on [place] Day #    4       Date: Thursday 08/15/20 1003  Red Alert Reason: Know who to call about changes in condition? No  scheduled follow-up? No  Other questions/problems? yes  Insurance: [employer] Medicare VA benefit  [facility] admissions x 2 ED visits x 2 in the last 6 months  Last admission 08/06/20 to 08/10/20 Hypertension (HTN), monomorphic ventricular tachycardia, Chronic Kidney disease (CKD)stage 3 a, acute on chronic heart failure atypical chest pain, acute respiratory failure with hypoxia secondary to heart failure   Outreach attempt # 2 successful to [phone] Patient is able to verify HIPAA identifiers [facility] [place] Management RN reviewed purpose of outreach with patient- follow up on EMMI/resolve, begin assessment of care coordination needs and some disease Best   Consent: [facility] RN CM reviewed [facility] services with patient. Patient gave verbal consent for services [facility] telephonic RN CM.  Advised patient that there will be further automated EMMI-post discharge calls to assess how the patient is doing following the recent hospitalization Advised the patient that another call may be received from a nurse if any of their responses were abnormal. Patient voiced understanding and was appreciative of f/u call.  EMMI:  Follow up on EMMI all issues resolved  He also has a follow up with his cardiology Hochrein on 08/30/20 and an appointment on 09/02/20 with cardiology  Other questions/problems? yes His questions are related to symptoms of blurred vision that he reports he has noted since starting one of his new medications ( atorvastatin, farxiga, Imdur, or aldactone) upon hospital discharge He denies having dizziness, or other symptoms Just blurred vision [facility] RN CM encouraged Robert Thorstenson to  monitor his blood pressure (BP) and document it for at least 3 days prior to him taking any of his BP medicine to assess for hypotension episodes. Education provided on hypotension symptoms, falls, safety Encouraged him to take this documentation to his 08/30/20 MD visit  congestive Heart Failure (CHF)/Hypertension (HTN) Confirms he has a BP cuff an scales and is monitoring at home Denies any worsening symptoms, nor any care coordination needs today  He confirms he does not smoke and has not had any falls    Further [facility] services Discussed [facility] program and assessed for permission for further outreach for disease Best Robert [name] agreed to further [facility] services    Social: [name] is a 75 year old single male who lives alone but with the support of his daughter [name]. He is doing well with his care needs  He works in [employer] part time. He reports he has been out of work for 3 weeks and is eager to return to work after being seen by his medical providers  Patient Active Problem List   Diagnosis Date Noted  . Atypical chest pain 08/07/2020  . Elevated troponin 08/07/2020  . Acute on chronic HFrEF (heart failure with reduced ejection fraction) ([place]) 08/06/2020  . Acute on chronic combined systolic and diastolic CHF (congestive heart failure) ([place]) 07/01/2020  . Acute on chronic respiratory failure with hypoxia ([place]) 07/01/2020  . Chronic kidney disease, stage 3a ([place]) 07/01/2020  . Thrombocytopenia ([place]) 07/01/2020  . Positive D dimer 07/01/2020  . Acute hypoxemic respiratory failure ([place])   . PVD (peripheral vascular disease) ([place]) 08/29/2019  .  Educated about COVID-19 virus infection 08/29/2019  . Bradycardia 09/05/2018  . Dyslipidemia 09/05/2018  . AAA (abdominal aortic aneurysm) without rupture ([place]) 09/05/2018  . Heart block 03/10/2018  . Cardiomyopathy ([place]) 10/29/2017  . TOBACCO ABUSE 11/30/2008  . CHRONIC SYSTOLIC HEART FAILURE 39/76/7341  . ABDOMINAL  AORTIC ANEURYSM 11/30/2008  . GLAUCOMA 11/29/2008  . Essential hypertension 11/29/2008  . Coronary atherosclerosis 11/29/2008  . Monomorphic ventricular tachycardia ([place]) 11/29/2008  . CHF 11/29/2008    Plans Patient agrees to the care plan and follow up [facility] RN CM will follow up with [name] as agreed within the next 30-45  business days Pt encouraged to return a call to [facility] RN CM prn-  Goals Addressed              This Visit's Progress     Patient Stated   .  [facility]) Track and Manage My Blood Pressure-Hypertension (pt-stated)   On track     Timeframe:  Short-Term Goal Priority:  Medium Start Date:            08/22/20                 Expected End Date:   09/30/20                    Follow Up Date 09/30/20   - check blood pressure 3 times per week - choose a place to take my blood pressure (home, clinic or office, retail store) - write blood pressure results in a log or diary     Notes: 08/30/20 denies worsening symptoms to see cardiology today  08/22/20 symptoms of blurred vision that he reports he has noted since starting one of his new medications ( atorvastatin, farxiga, Imdur, or aldactone) upon hospital discharge He denies having dizziness, or other symptoms Just blurred vision    .  [facility]) Track and Manage Symptoms-Heart Failure (pt-stated)   On track     Timeframe:  Short-Term Goal Priority:  Medium Start Date:         08/30/20                    Expected End Date:                      09/30/20    - develop a rescue plan - know when to call the doctor - track symptoms and what helps feel better or worse       Notes:        Dub Maclellan L. Lavina Hamman, RN, BSN, [place] Coordinator Office number [phone] Main [facility] number [phone] Fax number [phone]

## 2020-09-01 NOTE — Progress Notes (Signed)
PCP: Primary HF Cardiologist: Dr Haroldine Laws   HPI: Mr Baller is a 75 y/o with history of  chronic systolic heart failure w/ biventricular dysfunction. Notes indicate he has a NICM.  Echos dating back to 2008 have shown chronic systolic heart failure, LVEF has been as low as 20%. Most recent echo 11/21 showed EF 20-25%, diffuse HK, G2DD, w/ moderately reduced RV systolic function and moderate MR. Other PMH includes HTN, Stage III CKD (baseline SCr ~1.5), PAD s/p bifem bypass and aorto-iliac aneursym repair, tobacco use, and abnormal chest CT 11/21 showing bilateral pulmonary nodules and mild mediastinal and hilar adenopathy.Quit smoking 2021.   He was recently admitted 11/21 for a/c CHF and diuresed w/ IV Lasix. D/c summary notes he was to be discharged home on lasix 40 mg daily but not on home med list and he reports not taking a fluid pill.   Admitted in 07/2020 with ADHD. HF meds adjusted to GDMT. Had cath that showed moderate non obstructive CAD. EF remained < 35%.   Today he returns for post hospital follow up. Overall feeling fine. Denies SOB/PND/Orthopnea. No syncope/presyncope. Appetite ok. No fever or chills. Weight at home 140-141   pounds. Taking all medications except he is intolerant atorvastatin due to blurred vision. He also ran out of hydralazine yesterday and didn't have refil. Lives alone. Drives to appointments. No longer smokes.   Cardiac Testing  RHC/LHC 07/2020 -moderate non-obstructive CAD  Echo 11/21 LVEF 20-25%, G2DD, diffuse HK, moderately reduced RV  CMRI 07/2020  - LV EF 24% These findings are consistent with non-ischemic cardiomyopathy and are highly suspicious for diffuse cardiac sarcoidosis. ECV is very elevated at 45% (amyloid range, however no other findings suggestive of cardiac amyloidosis), however most probably represent diffuse scarring.   ROS: All systems negative except as listed in HPI, PMH and Problem List.  SH:  Social History   Socioeconomic  History  . Marital status: Single    Spouse name: Not on file  . Number of children: Not on file  . Years of education: Not on file  . Highest education level: Not on file  Occupational History  . Occupation: part time [employer] technologist  Tobacco Use  . Smoking status: Former Smoker    Quit date: 03/08/2008    Years since quitting: 12.4  . Smokeless tobacco: Never Used  Substance and Sexual Activity  . Alcohol use: No  . Drug use: No  . Sexual activity: Not on file  Other Topics Concern  . Not on file  Social History Narrative   single male who lives alone but with the support of his daughter Deionte Spivack. He is doing well with his care needs    He works in [employer] part time.   Social Determinants of Health   Financial Resource Strain: Not on file  Food Insecurity: No Food Insecurity  . Worried About Charity fundraiser in the Last Year: Never true  . Ran Out of Food in the Last Year: Never true  Transportation Needs: No Transportation Needs  . Lack of Transportation (Medical): No  . Lack of Transportation (Non-Medical): No  Physical Activity: Not on file  Stress: Not on file  Social Connections: Not on file  Intimate Partner Violence: Not on file    FH: No family history on file.  Past Medical History:  Diagnosis Date  . ABDOMINAL AORTIC ANEURYSM   . CAD    Non obstructive (mild) cath 2007  . CHF    EF  15% in 2007, 25% currently  . GLAUCOMA   . HYPERTENSION   . TOBACCO ABUSE    Previous    Current Outpatient Medications  Medication Sig Dispense Refill  . aspirin 81 MG tablet Take 81 mg by mouth daily.    . cholecalciferol (VITAMIN D) 1000 UNITS tablet Take 1,000 Units by mouth 2 (two) times daily.     . dapagliflozin propanediol (FARXIGA) 10 MG TABS tablet Take 1 tablet (10 mg total) by mouth daily. 30 tablet 0  . furosemide (LASIX) 40 MG tablet Take 1 tablet (40 mg total) by mouth daily. 30 tablet 11  . isosorbide mononitrate (IMDUR) 30 MG 24 hr  tablet Take 0.5 tablets (15 mg total) by mouth daily. 30 tablet 0  . metoprolol succinate (TOPROL-XL) 100 MG 24 hr tablet Take 50 mg by mouth daily. Take with or immediately following a meal.    . potassium chloride (KLOR-CON) 10 MEQ tablet Take 2 tablets (20 mEq total) by mouth daily. 30 tablet 0  . sacubitril-valsartan (ENTRESTO) 97-103 MG Take 1 tablet by mouth 2 (two) times daily. 180 tablet 3  . spironolactone (ALDACTONE) 25 MG tablet Take 1 tablet (25 mg total) by mouth daily. 30 tablet 3  . hydrALAZINE (APRESOLINE) 25 MG tablet Take 0.5 tablets (12.5 mg total) by mouth every 8 (eight) hours. (Patient not taking: Reported on 09/02/2020) 30 tablet 0   No current facility-administered medications for this encounter.    Vitals:   09/02/20 1531  BP: 102/68  Pulse: (!) 56  SpO2: 97%  Weight: 65.3 kg (144 lb)   Wt Readings from Last 3 Encounters:  09/02/20 65.3 kg (144 lb)  08/30/20 65.8 kg (145 lb)  08/10/20 63.1 kg (139 lb 3.2 oz)    PHYSICAL EXAM: General:  Well appearing. No resp difficulty HEENT: normal Neck: supple. JVP flat. Carotids 2+ bilaterally; no bruits. No lymphadenopathy or thryomegaly appreciated. Cor: PMI normal. Regular rate & rhythm. No rubs, gallops or murmurs. Lungs: clear Abdomen: soft, nontender, nondistended. No hepatosplenomegaly. No bruits or masses. Good bowel sounds. Extremities: no cyanosis, clubbing, rash, edema Neuro: alert & orientedx3, cranial nerves grossly intact. Moves all 4 extremities w/o difficulty. Affect pleasant.   EKG: SR 61 bpm RBBB  146 ms   ASSESSMENT & PLAN: Chronic Combined Systolic and Diastolic Biventricular Heart Failure - CMP dates back to at least 2007, EF chronically in 20-35% range. Reported as NICM (notes indicate nonobstructive CAD on cath in 2007, study report not on file) - Most recent echo 11/21 LVEF 20-25%, G2DD, diffuse HK, moderately reduced RV - CMRI suggestive of sarcoid. EF 24%---> Needs Cardiac Pet to further  assess for sarcoid and refer to Dr Weyman Croon. Start prednisone 30 mg daily and started Bactrim DS 1 tab M-W-F - NICM. R/LHC w/ mod no-obs CAD, elevated filling pressures and CI 1.9  -NYHA II. Functional improvement. Volume status stable. Continue lasix 40 mg daily.  - Continue Entresto 97-103 - Continue Toprol XL 50 mg daily  - Can hold hydralazine. Continue imdur daily.  - Continue Spiro 25 mg daily  - Continue Farxiga 10 mg daily (hgb A1c 6.1) - Check BMET today  - Refer to EP for ICD    2. CAD  - LHC this admit w/ mod non-obs CAD - Continue ASA + ? blocker - Intolerant atorvastatin due to blurred vision.   - Will need to refer to [facility] for repatha.   3.Stage IIIaCKD - Baseline SCr ~1.4-1.5 - Check BMET  4. PVCs/NSVT - cath w/ non obs CAD  - Continue Toprol 50 mg daily - Refer to EP for ICD.   5. PAD -s/p bifem bypass and [facility]  - continue ASA  - Intolerant statin.   -Refer to [facility] for Cardiac PET for suspect sarcoid. Refer to Dr Weyman Croon at [facility]. Start prednisone 30 mg daily and add Bactrim DS 1 tab M-W-F -Will need to refer to EP for ICD--> Refer to Dr [name].  - Follow up with Dr Haroldine Laws in 3-4 weeks.   Amy Clegg NP-C  4:22 PM  Patient seen and examined with the above-signed Advanced Practice Provider and/or Housestaff. I personally reviewed laboratory data, imaging studies and relevant notes. I independently examined the patient and formulated the important aspects of the plan. I have edited the note to reflect any of my changes or salient points. I have personally discussed the plan with the patient and/or family.  Feels much better since discharge. Now NYHA II-III. Volume status ok.   Results of MRI reviewed. Appears c/w cardiac sarcoidosis. Denies palpitations, syncope or presyncope.   General:  Thin elderly male No resp difficulty HEENT: normal Neck: supple. no JVD. Carotids 2+ bilat; no bruits. No lymphadenopathy  or thryomegaly appreciated. Cor: PMI laterally displaced. Regular rate & rhythm. No rubs, gallops or murmurs. Lungs: clear Abdomen: soft, nontender, nondistended. No hepatosplenomegaly. No bruits or masses. Good bowel sounds. Extremities: no cyanosis, clubbing, rash, edema Neuro: alert & orientedx3, cranial nerves grossly intact. moves all 4 extremities w/o difficulty. Affect pleasant  I reviewed his cMRI with him and we discussed probable diagnosis of cardiac sarcoid. Will start prednisone 30mg  daily. Add bactrim for PJP prophylaxis.   Will order PET scan at [facility] for further evaluation.   I have discussed case with Dr. Weyman Croon at [facility] who will see him in the [place] sarcoid clinic.  Refer to EP to discuss possible ICD. (Had significant ventricular ectopy during recent hospitalization with NSVT and PVCs)  Continue to titrate GDMT. Watch fluid status on steroids.   Total time spent 45 minutes. Over half that time spent discussing above.   Glori Bickers, MD  8:04 PM

## 2020-09-02 ENCOUNTER — Encounter (HOSPITAL_COMMUNITY): Payer: Self-pay

## 2020-09-02 ENCOUNTER — Ambulatory Visit (HOSPITAL_COMMUNITY)
Admit: 2020-09-02 | Discharge: 2020-09-02 | Disposition: A | Discharge status: Home / Self Care | Payer: Medicare Other | Attending: Adult Health | Admitting: Adult Health

## 2020-09-02 ENCOUNTER — Other Ambulatory Visit: Payer: Self-pay

## 2020-09-02 VITALS — BP 102/68 | HR 56 | Wt 144.0 lb

## 2020-09-02 DIAGNOSIS — I130 Hypertensive heart and chronic kidney disease with heart failure and stage 1 through stage 4 chronic kidney disease, or unspecified chronic kidney disease: Secondary | ICD-10-CM | POA: Diagnosis not present

## 2020-09-02 DIAGNOSIS — N1831 Chronic kidney disease, stage 3a: Secondary | ICD-10-CM | POA: Diagnosis not present

## 2020-09-02 DIAGNOSIS — I472 Ventricular tachycardia: Secondary | ICD-10-CM | POA: Diagnosis not present

## 2020-09-02 DIAGNOSIS — Z7984 Long term (current) use of oral hypoglycemic drugs: Secondary | ICD-10-CM | POA: Insufficient documentation

## 2020-09-02 DIAGNOSIS — Z7982 Long term (current) use of aspirin: Secondary | ICD-10-CM | POA: Insufficient documentation

## 2020-09-02 DIAGNOSIS — I2510 Atherosclerotic heart disease of native coronary artery without angina pectoris: Secondary | ICD-10-CM | POA: Diagnosis not present

## 2020-09-02 DIAGNOSIS — I5042 Chronic combined systolic (congestive) and diastolic (congestive) heart failure: Secondary | ICD-10-CM | POA: Insufficient documentation

## 2020-09-02 DIAGNOSIS — I428 Other cardiomyopathies: Secondary | ICD-10-CM | POA: Diagnosis not present

## 2020-09-02 DIAGNOSIS — R001 Bradycardia, unspecified: Secondary | ICD-10-CM | POA: Diagnosis not present

## 2020-09-02 DIAGNOSIS — Z79899 Other long term (current) drug therapy: Secondary | ICD-10-CM | POA: Diagnosis not present

## 2020-09-02 DIAGNOSIS — Z87891 Personal history of nicotine dependence: Secondary | ICD-10-CM | POA: Insufficient documentation

## 2020-09-02 DIAGNOSIS — D8685 Sarcoid myocarditis: Secondary | ICD-10-CM

## 2020-09-02 DIAGNOSIS — I4510 Unspecified right bundle-branch block: Secondary | ICD-10-CM | POA: Diagnosis not present

## 2020-09-02 DIAGNOSIS — I5082 Biventricular heart failure: Secondary | ICD-10-CM | POA: Diagnosis not present

## 2020-09-02 DIAGNOSIS — I5022 Chronic systolic (congestive) heart failure: Secondary | ICD-10-CM | POA: Diagnosis not present

## 2020-09-02 LAB — BASIC METABOLIC PANEL
Anion gap: 8 (ref 5–15)
BUN: 35 mg/dL — ABNORMAL HIGH (ref 8–23)
CO2: 25 mmol/L (ref 22–32)
Calcium: 9.4 mg/dL (ref 8.9–10.3)
Chloride: 104 mmol/L (ref 98–111)
Creatinine, Ser: 1.81 mg/dL — ABNORMAL HIGH (ref 0.61–1.24)
GFR, Estimated: 39 mL/min — ABNORMAL LOW (ref >60)
Glucose, Bld: 90 mg/dL (ref 70–99)
Potassium: 4.4 mmol/L (ref 3.5–5.1)
Sodium: 137 mmol/L (ref 135–145)

## 2020-09-02 MED ORDER — PREDNISONE 10 MG PO TABS: 30.0000 mg | ORAL_TABLET | Freq: Every day | ORAL | 11 refills | Status: DC

## 2020-09-02 MED ORDER — SULFAMETHOXAZOLE-TRIMETHOPRIM 800-160 MG PO TABS: 1.0000 | ORAL_TABLET | ORAL | 11 refills | Status: DC

## 2020-09-02 NOTE — Patient Instructions (Addendum)
STOP Hydralazine START Prednisone 10 mg (three tabs) daily START Bactrim DS, one tab three times a week   You have been referred to [facility] for a cardiac PET scan -they will be in touch with appointment details, once we get procedure authorized with insurance  You have been referred to [facility] (Dr Hiram Gash) -they will be in touch with appointment details Address: [facility] [address] [place], [place] [address] Phone: [phone]   You have been referred to [facility] Dr [name] Address: [place] [address] Phone: [phone] -they will be in touch with appointment details  Your physician recommends that you schedule a follow-up appointment in: 3-4 weeks  If you have any questions or concerns before your next appointment please send [place] a message through mychart or call our office at [phone].    TO LEAVE A MESSAGE FOR THE NURSE SELECT OPTION 2, PLEASE LEAVE A MESSAGE INCLUDING: . YOUR NAME . DATE OF BIRTH . CALL BACK NUMBER . REASON FOR CALL**this is important as we prioritize the call backs  YOU WILL RECEIVE A CALL BACK THE SAME DAY AS LONG AS YOU CALL BEFORE 4:00 PM

## 2020-09-03 ENCOUNTER — Telehealth (HOSPITAL_COMMUNITY): Payer: Self-pay | Admitting: Cardiology

## 2020-09-03 DIAGNOSIS — I5022 Chronic systolic (congestive) heart failure: Secondary | ICD-10-CM

## 2020-09-03 MED ORDER — FUROSEMIDE 40 MG PO TABS: 20.0000 mg | ORAL_TABLET | Freq: Every day | ORAL | 11 refills | Status: DC

## 2020-09-03 NOTE — Telephone Encounter (Signed)
Pt aware and voiced understanding Repeat labs 1/20

## 2020-09-03 NOTE — Telephone Encounter (Signed)
-----   Message from Conrad , NP sent at 09/02/2020  9:22 PM EST ----- Renal function elevated. Stop Potassium. Hold lasix x 1 day then cut back lasix to 20 mg daily. Check BMET in 7 days.

## 2020-09-06 DIAGNOSIS — H16223 Keratoconjunctivitis sicca, not specified as Sjogren's, bilateral: Secondary | ICD-10-CM | POA: Diagnosis not present

## 2020-09-06 DIAGNOSIS — Z947 Corneal transplant status: Secondary | ICD-10-CM | POA: Diagnosis not present

## 2020-09-06 DIAGNOSIS — T868401 Corneal transplant rejection, right eye: Secondary | ICD-10-CM | POA: Diagnosis not present

## 2020-09-06 DIAGNOSIS — H401123 Primary open-angle glaucoma, left eye, severe stage: Secondary | ICD-10-CM | POA: Diagnosis not present

## 2020-09-12 ENCOUNTER — Ambulatory Visit (HOSPITAL_COMMUNITY)
Admission: RE | Admit: 2020-09-12 | Discharge: 2020-09-12 | Disposition: A | Discharge status: Home / Self Care | Payer: Medicare Other | Source: Ambulatory Visit | Attending: Cardiology | Admitting: Cardiology

## 2020-09-12 ENCOUNTER — Other Ambulatory Visit: Payer: Self-pay

## 2020-09-12 DIAGNOSIS — I5022 Chronic systolic (congestive) heart failure: Secondary | ICD-10-CM | POA: Diagnosis not present

## 2020-09-12 LAB — BASIC METABOLIC PANEL
Anion gap: 8 (ref 5–15)
BUN: 42 mg/dL — ABNORMAL HIGH (ref 8–23)
CO2: 23 mmol/L (ref 22–32)
Calcium: 9.2 mg/dL (ref 8.9–10.3)
Chloride: 105 mmol/L (ref 98–111)
Creatinine, Ser: 1.57 mg/dL — ABNORMAL HIGH (ref 0.61–1.24)
GFR, Estimated: 46 mL/min — ABNORMAL LOW (ref >60)
Glucose, Bld: 147 mg/dL — ABNORMAL HIGH (ref 70–99)
Potassium: 4.9 mmol/L (ref 3.5–5.1)
Sodium: 136 mmol/L (ref 135–145)

## 2020-09-27 DIAGNOSIS — H2512 Age-related nuclear cataract, left eye: Secondary | ICD-10-CM | POA: Diagnosis not present

## 2020-09-27 DIAGNOSIS — H401123 Primary open-angle glaucoma, left eye, severe stage: Secondary | ICD-10-CM | POA: Diagnosis not present

## 2020-09-27 DIAGNOSIS — Z947 Corneal transplant status: Secondary | ICD-10-CM | POA: Diagnosis not present

## 2020-09-27 DIAGNOSIS — H16223 Keratoconjunctivitis sicca, not specified as Sjogren's, bilateral: Secondary | ICD-10-CM | POA: Diagnosis not present

## 2020-09-27 DIAGNOSIS — T868401 Corneal transplant rejection, right eye: Secondary | ICD-10-CM | POA: Diagnosis not present

## 2020-09-30 ENCOUNTER — Other Ambulatory Visit: Payer: Self-pay

## 2020-09-30 ENCOUNTER — Other Ambulatory Visit: Payer: Self-pay | Admitting: *Deleted

## 2020-09-30 NOTE — Progress Notes (Signed)
Advanced Heart Failure Clinic Note PCP: Dr. Wendi Snipes Primary Cardiologist: Dr. Antoine Poche HF Cardiologist: Dr. Gala Romney   HPI: [name] is a 75 y/o with history of  chronic systolic heart failure w/ biventricular dysfunction. Notes indicate he has a NICM.  Echos dating back to 2008 have shown chronic systolic heart failure, LVEF has been as low as 20%. Most recent echo 11/21 showed EF 20-25%, diffuse HK, G2DD, w/ moderately reduced RV systolic function and moderate Robert. Other PMH includes HTN, Stage III CKD (baseline SCr ~1.5), PAD s/p bifem bypass and aorto-iliac aneursym repair, tobacco use, and abnormal chest CT 11/21 showing bilateral pulmonary nodules and mild mediastinal and hilar adenopathy.Quit smoking 2021.   He was recently admitted 11/21 for a/c CHF and diuresed w/ IV Lasix. D/c summary notes he was to be discharged home on lasix 40 mg daily but not on home med list and he reports not taking a fluid pill.   Admitted in 07/2020 with ADHD. HF meds adjusted to GDMT. Had cath that showed moderate non obstructive CAD. EF remained < 35%.   He returned 1/22 for post hospital follow up. Overall feeling fine. Taking all medications except he is intolerant atorvastatin due to blurred vision. Lives alone. Drives to appointments. No longer smokes. Discussed cMRI results suggesting sarcoidosis. PET scan at [facility] arranged and EP referral made for ICD  consideration. Prednisone 30mg  daily started and Bactrim for PJP prophylaxis added at this visit.   Today he returns for HF follow up. Overall feeling fine. Says breathing is about the same. Denies increasing SOB, CP, dizziness, edema, or PND/Orthopnea. Appetite has picked up since starting prednisone, weight up 11lbs . No fever or chills. Weight at home ~ 147-150 pounds. Taking all medications. He is scheduled to have PET scan at [facility] 10/21/20.  Cardiac Testing  RHC/LHC 07/2020: moderate non-obstructive CAD  Echo 11/21: EF 20-25%, G2DD, diffuse HK,  moderately reduced RV  cMRI 07/2020: LV EF 24% These findings are consistent with non-ischemic cardiomyopathy and are highly suspicious for diffuse cardiac sarcoidosis. ECV is very elevated at 45% (amyloid range, however no other findings suggestive of cardiac amyloidosis), however most probably represent diffuse scarring.   ROS: All systems negative except as listed in HPI, PMH and Problem List.  SH:  Social History   Socioeconomic History  . Marital status: Single    Spouse name: Not on file  . Number of children: Not on file  . Years of education: Not on file  . Highest education level: Not on file  Occupational History  . Occupation: part time Restaurant manager, fast food  Tobacco Use  . Smoking status: Former Smoker    Quit date: 03/08/2008    Years since quitting: 12.5  . Smokeless tobacco: Never Used  Substance and Sexual Activity  . Alcohol use: No  . Drug use: No  . Sexual activity: Not on file  Other Topics Concern  . Not on file  Social History Narrative   single male who lives alone but with the support of his daughter [name]. He is doing well with his care needs    He works in [employer] part time.   Social Determinants of Health   Financial Resource Strain: Not on file  Food Insecurity: No Food Insecurity  . Worried About Programme researcher, broadcasting/film/video in the Last Year: Never true  . Ran Out of Food in the Last Year: Never true  Transportation Needs: No Transportation Needs  . Lack of Transportation (Medical): No  .  Lack of Transportation (Non-Medical): No  Physical Activity: Not on file  Stress: Not on file  Social Connections: Not on file  Intimate Partner Violence: Not on file    FH: No family history on file.  Past Medical History:  Diagnosis Date  . ABDOMINAL AORTIC ANEURYSM   . CAD    Non obstructive (mild) cath 2007  . CHF    EF 15% in 2007, 25% currently  . GLAUCOMA   . HYPERTENSION   . TOBACCO ABUSE    Previous    Current Outpatient  Medications  Medication Sig Dispense Refill  . aspirin 81 MG tablet Take 81 mg by mouth daily.    . cholecalciferol (VITAMIN D) 1000 UNITS tablet Take 1,000 Units by mouth 2 (two) times daily.     . dapagliflozin propanediol (FARXIGA) 10 MG TABS tablet Take 1 tablet (10 mg total) by mouth daily. 30 tablet 0  . furosemide (LASIX) 40 MG tablet Take 0.5 tablets (20 mg total) by mouth daily. 15 tablet 11  . isosorbide mononitrate (IMDUR) 30 MG 24 hr tablet Take 0.5 tablets (15 mg total) by mouth daily. 30 tablet 0  . metoprolol succinate (TOPROL-XL) 100 MG 24 hr tablet Take 50 mg by mouth daily. Take with or immediately following a meal.    . predniSONE (DELTASONE) 10 MG tablet Take 3 tablets (30 mg total) by mouth daily. 90 tablet 11  . sacubitril-valsartan (ENTRESTO) 97-103 MG Take 1 tablet by mouth 2 (two) times daily. 180 tablet 3  . spironolactone (ALDACTONE) 25 MG tablet Take 1 tablet (25 mg total) by mouth daily. 30 tablet 3  . sulfamethoxazole-trimethoprim (BACTRIM DS) 800-160 MG tablet Take 1 tablet by mouth 3 (three) times a week. 12 tablet 11   No current facility-administered medications for this encounter.    Vitals:   10/01/20 1500  BP: 129/81  Pulse: 63  SpO2: 99%  Weight: 70.3 kg (155 lb)   Wt Readings from Last 3 Encounters:  10/01/20 70.3 kg (155 lb)  09/02/20 65.3 kg (144 lb)  08/30/20 65.8 kg (145 lb)    PHYSICAL EXAM: General:  NAD. Thin, elderly. No resp difficulty HEENT: Normal, right eye opacified Neck: Supple. No JVD. Carotids 2+ bilat; no bruits. No lymphadenopathy or thryomegaly appreciated. Cor: PMI laterally displaced. Regular rate & rhythm. No rubs, gallops or murmurs. Lungs: Clear Abdomen: Soft, nontender, nondistended. No hepatosplenomegaly. No bruits or masses. Good bowel sounds. Extremities: No cyanosis, clubbing, rash, edema Neuro: alert & oriented x 3, cranial nerves grossly intact. Moves all 4 extremities w/o difficulty. Affect pleasant.  ReDs  Clip: 30%  ASSESSMENT & PLAN: 1. Chronic Combined Systolic and Diastolic Biventricular Heart Failure - CMP dates back to at least 2007, EF chronically in 20-35% range. Reported as NICM (notes indicate nonobstructive CAD on cath in 2007, study report not on file) - Most recent echo 11/21 LVEF 20-25%, G2DD, diffuse HK, moderately reduced RV - cMRI suggestive of sarcoid. EF 24%---> Referred Cardiac Pet to further assess for sarcoid and refer to Dr. Jeralyn Ruths. Continue prednisone 30 mg daily and Bactrim DS 1 tab M-W-F. - NICM. R/LHC w/ mod no-obs CAD, elevated filling pressures and CI 1.9.  - NYHA II-III. Volume status stable. Continue lasix 40 mg daily. (Watch fluid status on steroids).  - Continue Entresto 97-103 mg daily. - Continue Toprol XL 50 mg daily.  - Can hold hydralazine. Continue imdur daily.  - Continue spiro 25 mg daily. - Continue Farxiga 10 mg daily (hgb A1c  6.1) - Check BMET today.  - Referred to EP for ICD, has appt with Dr. Graciela Husbands next month. (Had significant ventricular ectopy during recent hospitalization with NSVT and PVCs).   2. CAD  - LHC (12/21) w/ mod non-obs CAD. - No chest pain. - Continue ASA + ? blocker. - Intolerant atorvastatin due to blurred vision.   - Referred to Lipid Clinic for Repatha.   3.Stage IIIaCKD - Baseline SCr ~1.4-1.5 - Check BMET.   4. PVCs/NSVT - Cath w/ non obs CAD. - Continue Toprol 50 mg daily. - Referred to EP for ICD.   5. PAD -S/p bifem bypass and [name] . - Continue ASA.  - Intolerant statin.  - No claudication symptoms.  - He has been referred to [facility] for Cardiac PET for suspect sarcoid, scheduled for 10/21/20. Referred to Dr. Jeralyn Ruths at [facility]. Continue prednisone 30 mg daily and Bactrim DS 1 tab M-W-F  - He has been referred to EP/Dr. Graciela Husbands for ICD. Appt 10/2020  - Follow up with Dr. Gala Romney in 1-2 months and follow back with Dr Antoine Poche as scheduled.  [facility]  FNP-BC 10/01/20 3:30 PM

## 2020-09-30 NOTE — Patient Outreach (Signed)
[facility]) Care Management  09/30/2020  Webster Patrone 29-Nov-1945 [id number]   [facility] Outreach to complex care patient Discharge from [facility] on [place] Day #    4       Date: Thursday 08/15/20 1003  Red Alert Reason: Know who to call about changes in condition? No  scheduled follow-up? No  Other questions/problems? yes   Insurance: NextGen Medicare VA benefit [place] [facility] admissions x 2 ED visits x 2 in the last 6 months  Last admission 08/06/20 to 08/10/20 Hypertension (HTN), monomorphic ventricular tachycardia, Chronic Kidney disease (CKD)stage 3 a, acute on chronic heart failure atypical chest pain, acute respiratory failure with hypoxia secondary to heart failure     Outreach successful Patient is able to verify HIPAA ([place]) identifiers Reviewed and addressed the purpose of the follow up call with the patient  Consent: [name] ([place] RN CM reviewed [facility] services with patient. Patient gave verbal consent for services.  Hypertension BP good - monitors at home and reports MDs inform him he is good  Saw MD on 10/01/20   congestive Heart Failure (CHF) good lasix decrease qod cramping  No changed BP medications but unable to tell [facility] RN CM his medications Tachycardia not noted no increased   No add salt to food he cooks at home   Preventive care Dr Meyer Russel seen last week  Eye seen Friday   Feet ok  Dentist 6-7 yr ago dentures   Want to improve his left eye site cataract MD does not preferred surgery One good eye - right  See md q 4 months   [employer] completed surgeries on right eye Has one brown eye and one blue eye Hx of corneal transplant Open angle Glaucoma  Take dog for walk daily  Retired from [employer] in [place] Patient agrees to the care plan and follow up  [place] will follow up with Mr Skow as agreed within the next 60-75  business days Pt encouraged to  return a call to [place] CM prn-  Shaymus Eveleth L. Lavina Hamman, RN, BSN, Tibbie Coordinator Office number [phone] [facility] number [phone] Fax number [phone]

## 2020-10-01 ENCOUNTER — Ambulatory Visit (HOSPITAL_COMMUNITY)
Admission: RE | Admit: 2020-10-01 | Discharge: 2020-10-01 | Disposition: A | Discharge status: Home / Self Care | Payer: Medicare Other | Source: Ambulatory Visit | Attending: Family Medicine | Admitting: Family Medicine

## 2020-10-01 ENCOUNTER — Encounter (HOSPITAL_COMMUNITY): Payer: Self-pay

## 2020-10-01 VITALS — BP 129/81 | HR 63 | Wt 155.0 lb

## 2020-10-01 DIAGNOSIS — I130 Hypertensive heart and chronic kidney disease with heart failure and stage 1 through stage 4 chronic kidney disease, or unspecified chronic kidney disease: Secondary | ICD-10-CM | POA: Insufficient documentation

## 2020-10-01 DIAGNOSIS — Z79899 Other long term (current) drug therapy: Secondary | ICD-10-CM | POA: Insufficient documentation

## 2020-10-01 DIAGNOSIS — N1831 Chronic kidney disease, stage 3a: Secondary | ICD-10-CM

## 2020-10-01 DIAGNOSIS — Z7984 Long term (current) use of oral hypoglycemic drugs: Secondary | ICD-10-CM | POA: Insufficient documentation

## 2020-10-01 DIAGNOSIS — I493 Ventricular premature depolarization: Secondary | ICD-10-CM | POA: Diagnosis not present

## 2020-10-01 DIAGNOSIS — I428 Other cardiomyopathies: Secondary | ICD-10-CM | POA: Diagnosis not present

## 2020-10-01 DIAGNOSIS — D8685 Sarcoid myocarditis: Secondary | ICD-10-CM

## 2020-10-01 DIAGNOSIS — Z7982 Long term (current) use of aspirin: Secondary | ICD-10-CM | POA: Insufficient documentation

## 2020-10-01 DIAGNOSIS — Z7952 Long term (current) use of systemic steroids: Secondary | ICD-10-CM | POA: Insufficient documentation

## 2020-10-01 DIAGNOSIS — Z87891 Personal history of nicotine dependence: Secondary | ICD-10-CM | POA: Diagnosis not present

## 2020-10-01 DIAGNOSIS — N183 Chronic kidney disease, stage 3 unspecified: Secondary | ICD-10-CM | POA: Diagnosis not present

## 2020-10-01 DIAGNOSIS — I5082 Biventricular heart failure: Secondary | ICD-10-CM | POA: Insufficient documentation

## 2020-10-01 DIAGNOSIS — I739 Peripheral vascular disease, unspecified: Secondary | ICD-10-CM | POA: Diagnosis not present

## 2020-10-01 DIAGNOSIS — I5022 Chronic systolic (congestive) heart failure: Secondary | ICD-10-CM | POA: Diagnosis not present

## 2020-10-01 DIAGNOSIS — I2510 Atherosclerotic heart disease of native coronary artery without angina pectoris: Secondary | ICD-10-CM

## 2020-10-01 LAB — BASIC METABOLIC PANEL
Anion gap: 10 (ref 5–15)
BUN: 25 mg/dL — ABNORMAL HIGH (ref 8–23)
CO2: 25 mmol/L (ref 22–32)
Calcium: 9.5 mg/dL (ref 8.9–10.3)
Chloride: 105 mmol/L (ref 98–111)
Creatinine, Ser: 1.5 mg/dL — ABNORMAL HIGH (ref 0.61–1.24)
GFR, Estimated: 49 mL/min — ABNORMAL LOW (ref >60)
Glucose, Bld: 170 mg/dL — ABNORMAL HIGH (ref 70–99)
Potassium: 4.5 mmol/L (ref 3.5–5.1)
Sodium: 140 mmol/L (ref 135–145)

## 2020-10-01 NOTE — Patient Instructions (Signed)
Labs done today, your results will be available in MyChart, we will contact you for abnormal readings.  Your physician recommends that you schedule a follow-up appointment in: 3 months with Dr. Haroldine Laws  Your physician recommends that you schedule a follow up with Dr. Percival Spanish at [facility] in 4 months  If you have any questions or concerns before your next appointment please send [place] a message through [facility] or call our office at [phone].    TO LEAVE A MESSAGE FOR THE NURSE SELECT OPTION 2, PLEASE LEAVE A MESSAGE INCLUDING: . YOUR NAME . DATE OF BIRTH . CALL BACK NUMBER . REASON FOR CALL**this is important as we prioritize the call backs  YOU WILL RECEIVE A CALL BACK THE SAME DAY AS LONG AS YOU CALL BEFORE 4:00 PM

## 2020-10-01 NOTE — Progress Notes (Signed)
ReDS Vest / Clip - 10/01/20 1500      ReDS Vest / Clip   Station Marker C    Ruler Value 30    ReDS Value Range Low volume    ReDS Actual Value 30    Anatomical Comments sitting

## 2020-10-21 DIAGNOSIS — D8685 Sarcoid myocarditis: Secondary | ICD-10-CM | POA: Diagnosis not present

## 2020-10-21 DIAGNOSIS — I5022 Chronic systolic (congestive) heart failure: Secondary | ICD-10-CM | POA: Diagnosis not present

## 2020-10-21 DIAGNOSIS — I2510 Atherosclerotic heart disease of native coronary artery without angina pectoris: Secondary | ICD-10-CM | POA: Diagnosis not present

## 2020-10-21 DIAGNOSIS — E785 Hyperlipidemia, unspecified: Secondary | ICD-10-CM | POA: Diagnosis not present

## 2020-10-21 DIAGNOSIS — I130 Hypertensive heart and chronic kidney disease with heart failure and stage 1 through stage 4 chronic kidney disease, or unspecified chronic kidney disease: Secondary | ICD-10-CM | POA: Diagnosis not present

## 2020-10-21 DIAGNOSIS — D649 Anemia, unspecified: Secondary | ICD-10-CM | POA: Diagnosis not present

## 2020-10-21 DIAGNOSIS — I700 Atherosclerosis of aorta: Secondary | ICD-10-CM | POA: Diagnosis not present

## 2020-10-21 DIAGNOSIS — N281 Cyst of kidney, acquired: Secondary | ICD-10-CM | POA: Diagnosis not present

## 2020-10-21 DIAGNOSIS — N1831 Chronic kidney disease, stage 3a: Secondary | ICD-10-CM | POA: Diagnosis not present

## 2020-10-21 DIAGNOSIS — I714 Abdominal aortic aneurysm, without rupture: Secondary | ICD-10-CM | POA: Diagnosis not present

## 2020-10-22 DIAGNOSIS — Z87891 Personal history of nicotine dependence: Secondary | ICD-10-CM | POA: Insufficient documentation

## 2020-10-23 DIAGNOSIS — H2589 Other age-related cataract: Secondary | ICD-10-CM | POA: Insufficient documentation

## 2020-10-23 DIAGNOSIS — H4089 Other specified glaucoma: Secondary | ICD-10-CM | POA: Insufficient documentation

## 2020-10-23 DIAGNOSIS — I5020 Unspecified systolic (congestive) heart failure: Secondary | ICD-10-CM | POA: Diagnosis not present

## 2020-10-28 ENCOUNTER — Ambulatory Visit (INDEPENDENT_AMBULATORY_CARE_PROVIDER_SITE_OTHER): Payer: Medicare Other | Admitting: Internal Medicine

## 2020-10-28 ENCOUNTER — Encounter: Payer: Self-pay | Admitting: Internal Medicine

## 2020-10-28 ENCOUNTER — Other Ambulatory Visit: Payer: Self-pay

## 2020-10-28 VITALS — BP 120/64 | HR 130 | Ht 71.0 in | Wt 155.0 lb

## 2020-10-28 DIAGNOSIS — Z01812 Encounter for preprocedural laboratory examination: Secondary | ICD-10-CM

## 2020-10-28 DIAGNOSIS — I5022 Chronic systolic (congestive) heart failure: Secondary | ICD-10-CM

## 2020-10-28 DIAGNOSIS — R001 Bradycardia, unspecified: Secondary | ICD-10-CM | POA: Diagnosis not present

## 2020-10-28 DIAGNOSIS — D8685 Sarcoid myocarditis: Secondary | ICD-10-CM | POA: Diagnosis not present

## 2020-10-28 DIAGNOSIS — I493 Ventricular premature depolarization: Secondary | ICD-10-CM | POA: Diagnosis not present

## 2020-10-28 LAB — CBC
Hematocrit: 42 % (ref 37.5–51.0)
Hemoglobin: 14.7 g/dL (ref 13.0–17.7)
MCH: 32.8 pg (ref 26.6–33.0)
MCHC: 35 g/dL (ref 31.5–35.7)
MCV: 94 fL (ref 79–97)
NRBC: 1 % — ABNORMAL HIGH (ref 0–0)
Platelets: 144 10*3/uL — ABNORMAL LOW (ref 150–450)
RBC: 4.48 x10E6/uL (ref 4.14–5.80)
RDW: 16.2 % — ABNORMAL HIGH (ref 11.6–15.4)
WBC: 9.3 10*3/uL (ref 3.4–10.8)

## 2020-10-28 LAB — BASIC METABOLIC PANEL
BUN/Creatinine Ratio: 20 (ref 10–24)
BUN: 34 mg/dL — ABNORMAL HIGH (ref 8–27)
CO2: 18 mmol/L — ABNORMAL LOW (ref 20–29)
Calcium: 9.4 mg/dL (ref 8.6–10.2)
Chloride: 111 mmol/L — ABNORMAL HIGH (ref 96–106)
Creatinine, Ser: 1.72 mg/dL — ABNORMAL HIGH (ref 0.76–1.27)
Glucose: 121 mg/dL — ABNORMAL HIGH (ref 65–99)
Potassium: 5 mmol/L (ref 3.5–5.2)
Sodium: 143 mmol/L (ref 134–144)
eGFR: 41 mL/min/{1.73_m2} — ABNORMAL LOW (ref >59)

## 2020-10-28 MED ORDER — APIXABAN 5 MG PO TABS: 5.0000 mg | ORAL_TABLET | Freq: Two times a day (BID) | ORAL | 3 refills | Status: DC

## 2020-10-28 NOTE — Progress Notes (Signed)
ELECTROPHYSIOLOGY CONSULT NOTE  Patient ID: [name], MRN: [id number], DOB/AGE: 1946-05-19 75 y.o. Admit date: (Not on file) Date of Consult: 10/28/2020  Primary Physician: Robert Mako, NP Primary Cardiologist: [name]     [name] is a 75 y.o. male who is being seen today for the evaluation of ICD at the request of [name].    HPI [name] is a 75 y.o. male referred for consideration of an ICD for primary prevention in the setting of nonischemic cardiomyopathy and sarcoid-suspected based on cMRI as well as abnormal PET currently managed at [facility] with prednisone with anticipation of repeat imaging and initiation of methotrexate.  On guideline directed medical therapy with persistence of left ventricular dysfunction  No palpitations.  Denies exercise limitation since having been hospitalized in November and December    DATE TEST EF   2008 Echo 35-40%   5/13 Echo  30-35%   7/19 Echo  20-25%   12/21 cMRI 24% Concerning Sarcoid  12/21 LHC   CAD mod nonobst  3/78 PET  Metabolic active sarcoid   Date Cr K Hgb  3/22 1.72 5.0 12.3 (12/21)     Presumed diagnosis of sarcoidosis treated now with prednisone without methotrexate.   Per vas disease with  bifem bypass/Aorto-iliac aneurysm repair    Past Medical History:  Diagnosis Date  . ABDOMINAL AORTIC ANEURYSM   . CAD    Non obstructive (mild) cath 2007  . CHF    EF 15% in 2007, 25% currently  . GLAUCOMA   . HYPERTENSION   . TOBACCO ABUSE    Previous      Surgical History:  Past Surgical History:  Procedure Laterality Date  . Clavical Repair    . CORNEAL TRANSPLANT     Right eye x 2  . EYE SURGERY     Glaucoma  . RIGHT/LEFT HEART CATH AND CORONARY ANGIOGRAPHY N/A 08/08/2020   Procedure: RIGHT/LEFT HEART CATH AND CORONARY ANGIOGRAPHY;  Surgeon: Robert Artist, MD;  Location: [facility];  Service: Cardiovascular;  Laterality: N/A;     Home Meds: Current Meds  Medication Sig  .  aspirin 81 MG tablet Take 81 mg by mouth daily.  . cholecalciferol (VITAMIN D) 1000 UNITS tablet Take 1,000 Units by mouth 2 (two) times daily.   . dapagliflozin propanediol (FARXIGA) 10 MG TABS tablet Take 1 tablet (10 mg total) by mouth daily.  . furosemide (LASIX) 40 MG tablet Take 0.5 tablets (20 mg total) by mouth daily. (Patient taking differently: Take 20 mg by mouth as needed.)  . isosorbide mononitrate (IMDUR) 30 MG 24 hr tablet Take 0.5 tablets (15 mg total) by mouth daily.  . metoprolol succinate (TOPROL-XL) 100 MG 24 hr tablet Take 50 mg by mouth daily. Take with or immediately following a meal.  . predniSONE (DELTASONE) 10 MG tablet Take 3 tablets (30 mg total) by mouth daily.  . sacubitril-valsartan (ENTRESTO) 97-103 MG Take 1 tablet by mouth 2 (two) times daily.  Marland Kitchen spironolactone (ALDACTONE) 25 MG tablet Take 1 tablet (25 mg total) by mouth daily.  Marland Kitchen sulfamethoxazole-trimethoprim (BACTRIM DS) 800-160 MG tablet Take 1 tablet by mouth 3 (three) times a week.    Allergies: No Known Allergies  Social History   Socioeconomic History  . Marital status: Single    Spouse name: Not on file  . Number of children: Not on file  . Years of education: Not on file  . Highest education level: Not on file  Occupational History  . Occupation: part time Brewing technologist  Tobacco Use  . Smoking status: Former Smoker    Quit date: 03/08/2008    Years since quitting: 12.6  . Smokeless tobacco: Never Used  Substance and Sexual Activity  . Alcohol use: No  . Drug use: No  . Sexual activity: Not on file  Other Topics Concern  . Not on file  Social History Narrative   single male who lives alone but with the support of his daughter [name]. He is doing well with his care needs    He works in [employer] part time.   Social Determinants of Health   Financial Resource Strain: Not on file  Food Insecurity: No Food Insecurity  . Worried About Charity fundraiser in the Last  Year: Never true  . Ran Out of Food in the Last Year: Never true  Transportation Needs: No Transportation Needs  . Lack of Transportation (Medical): No  . Lack of Transportation (Non-Medical): No  Physical Activity: Not on file  Stress: Not on file  Social Connections: Not on file  Intimate Partner Violence: Not on file     No family history on file.   ROS:  Please see the history of present illness.     All other systems reviewed and negative.    Physical Exam:  Blood pressure 120/64, pulse (!) 130, height 5\' 11"  (1.803 m), weight 155 lb (70.3 kg). General: Well developed, well nourished male in no acute distress. Head: Normocephalic, atraumatic, sclera non-icteric, no xanthomas, nares are without discharge. EENT: normal  Lymph Nodes:  none Neck: Negative for carotid bruits. JVD not elevated. Back:without scoliosis kyphosis  Lungs: Clear bilaterally to auscultation without wheezes, rales, or rhonchi. Breathing is unlabored. Heart: RRR with S1 S2. No   murmur . No rubs, or gallops appreciated. Abdomen: Soft, non-tender, non-distended with normoactive bowel sounds. No hepatomegaly. No rebound/guarding. No obvious abdominal masses. Msk:  Strength and tone appear normal for age. Extremities: No clubbing or cyanosis. No  edema.  Distal pedal pulses are 2+ and equal bilaterally. Skin: Warm and Dry Neuro: Alert and oriented X 3. CN III-XII intact Grossly normal sensory and motor function . Psych:  Responds to questions appropriately with a normal affect.      Labs: Cardiac Enzymes No results for input(s): CKTOTAL, CKMB, TROPONINI in the last 72 hours. CBC Lab Results  Component Value Date   WBC 8.3 08/08/2020   HGB 12.9 (L) 08/08/2020   HGB 11.9 (L) 08/08/2020   HCT 38.0 (L) 08/08/2020   HCT 35.0 (L) 08/08/2020   MCV 94.9 08/08/2020   PLT 153 08/08/2020   PROTIME: No results for input(s): LABPROT, INR in the last 72 hours. Chemistry No results for input(s): NA, K, CL,  CO2, BUN, CREATININE, CALCIUM, PROT, BILITOT, ALKPHOS, ALT, AST, GLUCOSE in the last 168 hours.  Invalid input(s): LABALBU Lipids Lab Results  Component Value Date   CHOL 106 08/07/2020   HDL 26 (L) 08/07/2020   LDLCALC 66 08/07/2020   TRIG 68 08/07/2020   BNP Pro B Natriuretic peptide (BNP)  Date/Time Value Ref Range Status  10/03/2007 12:32 PM 22.0 0.0 - 100.0 pg/mL Final  01/03/2007 09:45 AM 24.0 0.0 - 100.0 pg/mL Final  09/03/2006 11:02 AM 15.0 0.0 - 100.0 pg/mL Final   Thyroid Function Tests: No results for input(s): TSH, T4TOTAL, T3FREE, THYROIDAB in the last 72 hours.  Invalid input(s): FREET3 Miscellaneous Lab Results  Component Value Date  DDIMER 10.31 (H) 06/30/2020    Radiology/Studies:  No results found.  EKG: A. fib with a rapid rate of 130 bpm Intervals-/14/35 Right bundle branch left anterior fascicular block  ECG 1/22 demonstrates sinus rhythm at 61 Interval 25/15/43 Right bundle branch block left anterior fascicular block Assessment and Plan:  Cardiac sarcoid  Trifascicular block  Atrial fibrillation RVR   Patient has cardiac sarcoid with persistent left ventricular dysfunction despite guideline directed medical therapy.  There are multiple issues related to ICD implantation and nonischemic patients with relatively good functional status, however, in the context of his sarcoid and his trifascicular block the decisions are much easier.  First of all pacing is appropriate and would undertake CRT given his cardiomyopathy.  Secondly in the context of CRT implantation ICD implantation would be appropriate.  He is quite reluctant, somewhat overwhelmed by the information today.  Moreover, he is in atrial fibrillation today with a rapid ventricular rate.  Heart rate 130, not surprisingly without symptoms.  However, given his conduction system disease, augmented rate control is concerning for aggravating his heart block.  And hence, have suggested that a more  expeditious approach with TEE guided cardioversion would be appropriate.  I have reached out to Dr. Haroldine Laws and will plan to undertake TEE guided cardioversion this week.  We will discontinue aspirin and begin him on apixaban.  Dosed at 5 mg twice daily  We will reach out to him in a couple of weeks to review with him again is thinking on a device implantation and I have asked Dr. Reine Just to talk with him about this when he sees him for cardioversion  We also discussed strategies related to the treatment of sarcoid and the role of inflammation but also the concerns of residual scar     Virl Axe

## 2020-10-28 NOTE — Patient Instructions (Addendum)
Medication Instructions:  Your physician has recommended you make the following change in your medication:   ** Stop Aspirin  ** Begin Eliquis 5mg  - 1 tablet by mouth twice daily *If you need a refill on your cardiac medications before your next appointment, please call your pharmacy*   Lab Work:  If you have labs (blood work) drawn today and your tests are completely normal, you will receive your results only by: Marland Kitchen MyChart Message (if you have MyChart) OR . A paper copy in the mail If you have any lab test that is abnormal or we need to change your treatment, we will call you to review the results.   Testing/Procedures:Your physician has requested that you have a TEE. During a TEE, sound waves are used to create images of your heart. It provides your doctor with information about the size and shape of your heart and how well your heart's chambers and valves are working. In this test, a transducer is attached to the end of a flexible tube that's guided down your throat and into your esophagus (the tube leading from you mouth to your stomach) to get a more detailed image of your heart. You are not awake for the procedure. Please see the instruction sheet given to you today. For further information please visit [email or web address].   Your physician has recommended that you have a Cardioversion (DCCV). Electrical Cardioversion uses a jolt of electricity to your heart either through paddles or wired patches attached to your chest. This is a controlled, usually prescheduled, procedure. Defibrillation is done under light anesthesia in the hospital, and you usually go home the day of the procedure. This is done to get your heart back into a normal rhythm. You are not awake for the procedure. Please see the instruction sheet given to you today.    Follow-Up: At [facility], you and your health needs are our priority.  As part of our continuing mission to provide you with exceptional heart care,  we have created designated Provider Care Teams.  These Care Teams include your primary Cardiologist (physician) and Advanced Practice Providers (APPs -  Physician Assistants and Nurse Practitioners) who all work together to provide you with the care you need, when you need it.  We recommend signing up for the patient portal called "MyChart".  Sign up information is provided on this After Visit Summary.  MyChart is used to connect with patients for Virtual Visits (Telemedicine).  Patients are able to view lab/test results, encounter notes, upcoming appointments, etc.  Non-urgent messages can be sent to your provider as well.   To learn more about what you can do with MyChart, go to [email or web address].    Your next appointment:   1 month f/u with Dr [name]   Other Instructions We will call you in 2-3 weeks to see what your thoughts are re: ICD.   You are scheduled for a TEE Cardioversion on Friday, 11/01/2020 with Dr. Haroldine Laws.  Please arrive at the [facility] (Main Entrance A) at [facility]: [address] [place], [place] [address] at 7:30am  DIET: Nothing to eat or drink after midnight except a sip of water with medications (see medication instructions below)  Medication Instructions: Hold Farxiga and Spironolactone the morning of your procedure.  Continue your anticoagulant: Eliquis You will need to continue your anticoagulant after your procedure until you  are told by your Provider that it is safe to stop.   Labs:CBC and BMET today  Your Covid screening  is scheduled for Wednesday 10/30/2020 at 1130am.  The screening will be completed at [place]. [place], [place].  This is a drive thru testing site.  Please stay in your car.  You must have a responsible person to drive you home and stay in the waiting area during your procedure. Failure to do so could result in cancellation.  Bring your insurance cards.  *Special Note: Every effort is made to have your  procedure done on time. Occasionally there are emergencies that occur at the hospital that may cause delays. Please be patient if a delay does occur.

## 2020-10-29 ENCOUNTER — Other Ambulatory Visit (HOSPITAL_COMMUNITY): Payer: Self-pay | Admitting: *Deleted

## 2020-10-29 ENCOUNTER — Other Ambulatory Visit (HOSPITAL_COMMUNITY): Payer: Self-pay

## 2020-10-29 MED ORDER — ISOSORBIDE MONONITRATE ER 30 MG PO TB24: 15.0000 mg | ORAL_TABLET | Freq: Every day | ORAL | 3 refills | Status: DC

## 2020-10-29 MED ORDER — SPIRONOLACTONE 25 MG PO TABS: 25.0000 mg | ORAL_TABLET | Freq: Every day | ORAL | 3 refills | Status: AC

## 2020-10-29 MED ORDER — APIXABAN 5 MG PO TABS: 5.0000 mg | ORAL_TABLET | Freq: Two times a day (BID) | ORAL | 3 refills | Status: DC

## 2020-10-29 MED ORDER — DAPAGLIFLOZIN PROPANEDIOL 10 MG PO TABS: 10.0000 mg | ORAL_TABLET | Freq: Every day | ORAL | 3 refills | Status: DC

## 2020-10-30 ENCOUNTER — Other Ambulatory Visit (HOSPITAL_COMMUNITY)
Admission: RE | Admit: 2020-10-30 | Discharge: 2020-10-30 | Disposition: A | Discharge status: Home / Self Care | Payer: Medicare Other | Source: Ambulatory Visit | Attending: Internal Medicine | Admitting: Internal Medicine

## 2020-10-30 DIAGNOSIS — Z01812 Encounter for preprocedural laboratory examination: Secondary | ICD-10-CM | POA: Insufficient documentation

## 2020-10-30 DIAGNOSIS — Z20822 Contact with and (suspected) exposure to covid-19: Secondary | ICD-10-CM | POA: Insufficient documentation

## 2020-10-30 LAB — SARS CORONAVIRUS 2 (TAT 6-24 HRS): SARS Coronavirus 2: NEGATIVE

## 2020-10-31 ENCOUNTER — Ambulatory Visit (HOSPITAL_COMMUNITY): Payer: Medicare Other | Admitting: Certified Registered Nurse Anesthetist

## 2020-11-01 ENCOUNTER — Encounter (HOSPITAL_COMMUNITY): Payer: Self-pay | Admitting: Internal Medicine

## 2020-11-01 ENCOUNTER — Ambulatory Visit (HOSPITAL_COMMUNITY)
Admission: RE | Admit: 2020-11-01 | Discharge: 2020-11-01 | Disposition: A | Discharge status: Home / Self Care | Payer: Medicare Other | Source: Home / Self Care | Attending: Internal Medicine | Admitting: Internal Medicine

## 2020-11-01 ENCOUNTER — Ambulatory Visit (HOSPITAL_COMMUNITY)
Admission: RE | Admit: 2020-11-01 | Discharge: 2020-11-01 | Disposition: A | Discharge status: Home / Self Care | Payer: Medicare Other | Source: Home / Self Care | Attending: Student | Admitting: Student

## 2020-11-01 ENCOUNTER — Ambulatory Visit (HOSPITAL_COMMUNITY)
Admission: RE | Admit: 2020-11-01 | Discharge: 2020-11-01 | Disposition: A | Discharge status: Home / Self Care | Payer: Medicare Other | Attending: Internal Medicine | Admitting: Internal Medicine

## 2020-11-01 ENCOUNTER — Encounter (HOSPITAL_COMMUNITY)
Admission: RE | Disposition: A | Discharge status: Home / Self Care | Payer: Self-pay | Source: Home / Self Care | Attending: Internal Medicine

## 2020-11-01 ENCOUNTER — Other Ambulatory Visit: Payer: Self-pay

## 2020-11-01 DIAGNOSIS — Z538 Procedure and treatment not carried out for other reasons: Secondary | ICD-10-CM | POA: Insufficient documentation

## 2020-11-01 DIAGNOSIS — I4891 Unspecified atrial fibrillation: Secondary | ICD-10-CM | POA: Diagnosis not present

## 2020-11-01 SURGERY — CANCELLED PROCEDURE
Anesthesia: General

## 2020-11-01 NOTE — Progress Notes (Signed)
Pt in sinus rhythm.  ekg done and dr bensihmon notified.  Per doctor Bensihmon pt taken to afib clinic to possibly get a monitor.  Left message for pt's care giver/driver to update her of situation

## 2020-11-01 NOTE — Anesthesia Preprocedure Evaluation (Deleted)
Anesthesia Evaluation  Patient identified by MRN, date of birth, ID band Patient awake    Reviewed: Allergy & Precautions, NPO status , Patient's Chart, lab work & pertinent test results, reviewed documented beta blocker date and time   Airway Mallampati: III  TM Distance: >3 FB Neck ROM: Full    Dental  (+) Edentulous Upper, Edentulous Lower   Pulmonary neg pulmonary ROS, former smoker,  Sarcoidosis on prednisone   Pulmonary exam normal breath sounds clear to auscultation       Cardiovascular hypertension, Pt. on home beta blockers and Pt. on medications + CAD, + Peripheral Vascular Disease and +CHF  Normal cardiovascular exam+ dysrhythmias Atrial Fibrillation + Cardiac Defibrillator (currently being evaluated for ICD placement) + Valvular Problems/Murmurs MR  Rhythm:Regular Rate:Normal  TTE 2020 1. Left ventricular ejection fraction, by estimation, is 20 to 25%. The  left ventricle has severely decreased function. The left ventricle  demonstrates global hypokinesis. The left ventricular internal cavity size  was moderately dilated. Left  ventricular diastolic parameters were normal.  2. Right ventricular systolic function is moderately reduced. The right  ventricular size is moderately enlarged. There is moderately elevated  pulmonary artery systolic pressure.  3. Left atrial size was mildly dilated.  4. Degree of functional MR worse compared to echo done July 2019. The  mitral valve is normal in structure. Moderate mitral valve regurgitation.  No evidence of mitral stenosis.  5. The aortic valve is normal in structure. Aortic valve regurgitation is  mild. No aortic stenosis is present.  6. The inferior vena cava is dilated in size with <50% respiratory  variability, suggesting right atrial pressure of 15 mmHg.   Heart Cath 2021  Prox RCA to Mid RCA lesion is 50% stenosed.  Ost RCA to Prox RCA lesion is 30%  stenosed.  Dist RCA lesion is 30% stenosed.  Prox Cx lesion is 30% stenosed.  Mid Cx to Dist Cx lesion is 30% stenosed.  Prox LAD lesion is 30% stenosed.  Mid LAD lesion is 40% stenosed.  2nd Diag-1 lesion is 50% stenosed.  2nd Diag-2 lesion is 70% stenosed.   Findings:  Ao = 110/67 (85) LV = 109/20 RA = 8  RV = 49/14 PA = 51/25 (37) PCW = 25 (v = 31) Fick cardiac output/index = 3.6/1.9 PVR = 3.4 WU SVR = 1736 FA sat = 98% PA sat = 57%. 57%  Assessment: 1. Moderate non-obstructive CAD 2. Severe NICM EF 15%  3. Elevated filling pressures with low output    Neuro/Psych negative neurological ROS  negative psych ROS   GI/Hepatic negative GI ROS, Neg liver ROS,   Endo/Other  negative endocrine ROS  Renal/GU Renal InsufficiencyRenal disease (Cr 1.72, K 5)  negative genitourinary   Musculoskeletal negative musculoskeletal ROS (+)   Abdominal   Peds  Hematology  (+) Blood dyscrasia (on eliquis), ,   Anesthesia Other Findings TEE/CV for Afib  Reproductive/Obstetrics                           Anesthesia Physical Anesthesia Plan  ASA: III  Anesthesia Plan: General   Post-op Pain Management:    Induction: Intravenous  PONV Risk Score and Plan: 2 and Propofol infusion and Treatment may vary due to age or medical condition  Airway Management Planned: Natural Airway  Additional Equipment:   Intra-op Plan:   Post-operative Plan:   Informed Consent: I have reviewed the patients History and Physical, chart, labs  and discussed the procedure including the risks, benefits and alternatives for the proposed anesthesia with the patient or authorized representative who has indicated his/her understanding and acceptance.     Dental advisory given  Plan Discussed with: CRNA  Anesthesia Plan Comments: (Procedure cancelled. Pt in sinus rhythm.)      Anesthesia Quick Evaluation

## 2020-11-14 ENCOUNTER — Telehealth: Payer: Self-pay

## 2020-11-14 DIAGNOSIS — E875 Hyperkalemia: Secondary | ICD-10-CM

## 2020-11-14 NOTE — Telephone Encounter (Signed)
Attempted phone call to pt.  Per Epic ok to leave voicemail message.  Pt advised of normal labs with mild renal insufficiency and high normal K+.  Requested pt contact RN at [phone] to schedule lab appointment to recheck K+ per Dr Olin Pia request.  Order has been placed and pt is ready for scheduling.

## 2020-11-15 ENCOUNTER — Other Ambulatory Visit: Payer: Medicare Other | Admitting: *Deleted

## 2020-11-15 ENCOUNTER — Other Ambulatory Visit: Payer: Self-pay

## 2020-11-15 DIAGNOSIS — E875 Hyperkalemia: Secondary | ICD-10-CM | POA: Diagnosis not present

## 2020-11-15 LAB — BASIC METABOLIC PANEL
BUN/Creatinine Ratio: 7 — ABNORMAL LOW (ref 10–24)
BUN: 10 mg/dL (ref 8–27)
CO2: 21 mmol/L (ref 20–29)
Calcium: 9.1 mg/dL (ref 8.6–10.2)
Chloride: 106 mmol/L (ref 96–106)
Creatinine, Ser: 1.35 mg/dL — ABNORMAL HIGH (ref 0.76–1.27)
Glucose: 109 mg/dL — ABNORMAL HIGH (ref 65–99)
Potassium: 4.1 mmol/L (ref 3.5–5.2)
Sodium: 140 mmol/L (ref 134–144)
eGFR: 55 mL/min/{1.73_m2} — ABNORMAL LOW (ref >59)

## 2020-11-19 ENCOUNTER — Telehealth: Payer: Self-pay

## 2020-11-19 NOTE — Telephone Encounter (Signed)
-----   Message from Deboraha Sprang, MD sent at 11/13/2020  5:31 PM EDT ----- Please Inform Patient  Labs are normal x mild renal insuff and elevated K ( high normal)  lets recheck    Thanks

## 2020-11-19 NOTE — Telephone Encounter (Signed)
Spoke with pt who states he had labs redrawn on 11/15/2020.  Pt advised he will be contacted with results once Dr [name] has reviewed.  Pt verbalizes understanding and thanked Therapist, sports for the call.

## 2020-11-20 DIAGNOSIS — R55 Syncope and collapse: Secondary | ICD-10-CM | POA: Diagnosis not present

## 2020-11-28 ENCOUNTER — Other Ambulatory Visit: Payer: Self-pay | Admitting: *Deleted

## 2020-11-28 ENCOUNTER — Other Ambulatory Visit: Payer: Self-pay

## 2020-11-28 ENCOUNTER — Encounter: Payer: Self-pay | Admitting: *Deleted

## 2020-11-28 DIAGNOSIS — E559 Vitamin D deficiency, unspecified: Secondary | ICD-10-CM | POA: Insufficient documentation

## 2020-11-28 DIAGNOSIS — E119 Type 2 diabetes mellitus without complications: Secondary | ICD-10-CM | POA: Insufficient documentation

## 2020-11-28 DIAGNOSIS — N400 Enlarged prostate without lower urinary tract symptoms: Secondary | ICD-10-CM | POA: Insufficient documentation

## 2020-11-28 DIAGNOSIS — H44009 Unspecified purulent endophthalmitis, unspecified eye: Secondary | ICD-10-CM | POA: Insufficient documentation

## 2020-11-28 DIAGNOSIS — Z713 Dietary counseling and surveillance: Secondary | ICD-10-CM | POA: Insufficient documentation

## 2020-11-28 DIAGNOSIS — Z7189 Other specified counseling: Secondary | ICD-10-CM | POA: Insufficient documentation

## 2020-11-28 DIAGNOSIS — H524 Presbyopia: Secondary | ICD-10-CM | POA: Insufficient documentation

## 2020-11-28 DIAGNOSIS — M2550 Pain in unspecified joint: Secondary | ICD-10-CM | POA: Insufficient documentation

## 2020-11-28 DIAGNOSIS — Z961 Presence of intraocular lens: Secondary | ICD-10-CM | POA: Insufficient documentation

## 2020-11-28 DIAGNOSIS — Z23 Encounter for immunization: Secondary | ICD-10-CM | POA: Insufficient documentation

## 2020-11-28 DIAGNOSIS — Z789 Other specified health status: Secondary | ICD-10-CM | POA: Insufficient documentation

## 2020-11-28 DIAGNOSIS — F32A Depression, unspecified: Secondary | ICD-10-CM | POA: Insufficient documentation

## 2020-11-28 DIAGNOSIS — Z5181 Encounter for therapeutic drug level monitoring: Secondary | ICD-10-CM | POA: Insufficient documentation

## 2020-11-28 DIAGNOSIS — H35359 Cystoid macular degeneration, unspecified eye: Secondary | ICD-10-CM | POA: Insufficient documentation

## 2020-11-28 DIAGNOSIS — E785 Hyperlipidemia, unspecified: Secondary | ICD-10-CM | POA: Insufficient documentation

## 2020-11-28 DIAGNOSIS — H1810 Bullous keratopathy, unspecified eye: Secondary | ICD-10-CM | POA: Insufficient documentation

## 2020-11-28 DIAGNOSIS — I4891 Unspecified atrial fibrillation: Secondary | ICD-10-CM | POA: Insufficient documentation

## 2020-11-28 DIAGNOSIS — S82899A Other fracture of unspecified lower leg, initial encounter for closed fracture: Secondary | ICD-10-CM | POA: Insufficient documentation

## 2020-11-28 DIAGNOSIS — H401113 Primary open-angle glaucoma, right eye, severe stage: Secondary | ICD-10-CM | POA: Insufficient documentation

## 2020-11-28 DIAGNOSIS — H16009 Unspecified corneal ulcer, unspecified eye: Secondary | ICD-10-CM | POA: Insufficient documentation

## 2020-11-28 DIAGNOSIS — Z01812 Encounter for preprocedural laboratory examination: Secondary | ICD-10-CM | POA: Insufficient documentation

## 2020-11-28 DIAGNOSIS — H5213 Myopia, bilateral: Secondary | ICD-10-CM | POA: Insufficient documentation

## 2020-11-28 DIAGNOSIS — R55 Syncope and collapse: Secondary | ICD-10-CM | POA: Insufficient documentation

## 2020-11-28 DIAGNOSIS — N529 Male erectile dysfunction, unspecified: Secondary | ICD-10-CM | POA: Insufficient documentation

## 2020-11-28 DIAGNOSIS — D8685 Sarcoid myocarditis: Secondary | ICD-10-CM | POA: Insufficient documentation

## 2020-11-28 NOTE — Patient Outreach (Addendum)
[place] [facility]) Care Management  11/28/2020  [name] [birth year] [id number]   [facility] Outreach to complex care patient Discharge from [facility] cone on [place] Day #4Date: Thursday 08/15/20 1003 Red Alert Reason: Know who to call about changes in condition? No  scheduled follow-up? No  Other questions/problems? yes  Insurance:NextGen MedicareVA benefit [facility] admissions x2ED visits x 2in the last 6 months Last admission 08/06/20 to 12/18/21Hypertension (HTN), monomorphic ventricular tachycardia,Chronic Kidney disease (CKD)stage 3 a, acute on chronic heart failure atypical chest pain, acute respiratory failure with hypoxia secondary to heart failure   Patient is able to verify HIPAA ([name] and [name]) identifiers Reviewed and addressed the purpose of the follow up call with the patient  Consent: [name] ([name]) RN CM reviewed [name] services with patient. Patient gave verbal consent for services.   Follow up Overall he reports he is doing well Continues to smoke Scheduled for a colonoscopy on Jan 01 2021 1130-1300  HL1 PACT Team 9A  appointments Had recent 11/04/20 & 11/27/20 [facility] appointment, labs and reports not receiving some answers to type of diabetes, what lab (HgA1c)  values mean, how to use glucose machine  Reports given papers to read    Medications   Diabetes  pending [facility] delivery of diabetic medications with lower cost vs local pharmacy cost  Dr [name] is seen locally and to be seen on 11/29/20  diabetes explained, detailed education Learns better when things are explained to him  He confirms no reading issues  Does not prefer video education  HgA1c 8 he was informed by his [facility] MD He states he was informed 8 was good Discussed HgA1c   [facility] RN CM inquired about home monitoring continued to tell [name] RN CM he was getting an 8 [facility] RN CM discussed home monitoring  general process, importance and standard number [facility] RN CM inquired if he had a home monitoring device and if he could read the last number from it  [name] began cursing and expressing verbally abuse statements He found his glucometer and began to read the box  He has a Freestyle meter  He informs [facility] RN CM he did not check his glucose today but states he was informed to check "once a week every Thursday" The day of the outreach is Thursday [name] RN CM inquired about his last CBG  Last Thursday 11/21/20 he reports he did not write down the value but after more verbal abuse expressions he was able to state the memory indicated 93   [facility] RN CM discussed his verbal abusive responses and discussed boundaries of [facility] interaction related to verbal abuse and verbal abuse tolerance [facility] RN CM discussed the [name] program as a voluntary program and his option of informing [facility] RN CM he prefers not to participate    [name] apologized for his verbal abuse  He states this is "all new to me and have to get use to it" He reports being provided a lot of information but not answers to questions    Social: 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 12 yrs of age 81 children all adults with high education Lives alone He has 3 yrs college & is  high school graduate  He agreed to further [name] outreach  He states he is aware [name] services are "for my benefit"  Upcoming appointments  Go to Dr [name] Tuesday 12/03/20 Colonoscopy per pt on 01/01/21   Sent EMMIs via  mail as preferred on blood glucose monitoring, diabetes and diet, hemoglobin A1c tests, Type 2 diabetes to review prior to next outreach  He has also received written education from his MDs but reports he has not reviewed   Plans [facility] RN CM will follow up in 21-14 business days Pt encouraged to return a call to [name] RN CM prn Goals Addressed              This Visit's Progress     Patient Stated   .  [name]) Monitor and Manage My Blood Sugar-Diabetes Type 2  (pt-stated)   On track     Timeframe:  Short-Term Goal Priority:  Medium Start Date:                      11/28/20       Expected End Date:     01/21/21                  Follow Up Date    - check blood sugar at prescribed times - check blood sugar if I feel it is too high or too low - enter blood sugar readings and medication or insulin into daily log    Notes: 11/28/20 checking CBG every Thursday per pt. Last check on 11/21/20 =86, not checked today 11/28/20    .  COMPLETED: [name]) Track and Manage My Blood Pressure-Hypertension (pt-stated)   On track     Timeframe:  Short-Term Goal Priority:  Medium Start Date:            08/22/20                 Expected End Date:   09/30/20                       - check blood pressure 3 times per week - choose a place to take my blood pressure (home, clinic or office, retail store) - write blood pressure results in a log or diary    Notes:  11/28/20 BP wnl reported goal met 08/30/20 denies worsening symptoms to see cardiology today  08/22/20 symptoms of blurred vision that he reports he has noted since starting one of his new medications ( atorvastatin, farxiga, Imdur, or aldactone) upon hospital discharge He denies having dizziness, or other symptoms Just blurred vision    .  ([facility]) Track and Manage Symptoms-Heart Failure (pt-stated)   On track     Timeframe:  Short-Term Goal Priority:  Medium Start Date:         08/30/20                    Expected End Date:     12/20/20                    - develop a rescue plan - know when to call the doctor - track symptoms and what helps feel better or worse     Notes: 11/28/20  No worsening CHF symptoms       Abigail Teall L. Lavina Hamman, RN, BSN, [place] Coordinator Office number [phone] Main [facility] number [phone] Fax number [phone]

## 2020-12-02 ENCOUNTER — Other Ambulatory Visit: Payer: Self-pay

## 2020-12-02 ENCOUNTER — Ambulatory Visit (INDEPENDENT_AMBULATORY_CARE_PROVIDER_SITE_OTHER): Payer: Medicare Other | Admitting: Internal Medicine

## 2020-12-02 ENCOUNTER — Encounter: Payer: Self-pay | Admitting: Internal Medicine

## 2020-12-02 VITALS — BP 100/70 | HR 136 | Ht 70.0 in | Wt 156.0 lb

## 2020-12-02 DIAGNOSIS — I2510 Atherosclerotic heart disease of native coronary artery without angina pectoris: Secondary | ICD-10-CM | POA: Diagnosis not present

## 2020-12-02 DIAGNOSIS — I472 Ventricular tachycardia: Secondary | ICD-10-CM | POA: Diagnosis not present

## 2020-12-02 DIAGNOSIS — I480 Paroxysmal atrial fibrillation: Secondary | ICD-10-CM | POA: Diagnosis not present

## 2020-12-02 DIAGNOSIS — I4729 Other ventricular tachycardia: Secondary | ICD-10-CM

## 2020-12-02 DIAGNOSIS — I459 Conduction disorder, unspecified: Secondary | ICD-10-CM

## 2020-12-02 DIAGNOSIS — I429 Cardiomyopathy, unspecified: Secondary | ICD-10-CM

## 2020-12-02 DIAGNOSIS — I5020 Unspecified systolic (congestive) heart failure: Secondary | ICD-10-CM | POA: Diagnosis not present

## 2020-12-02 DIAGNOSIS — D8685 Sarcoid myocarditis: Secondary | ICD-10-CM | POA: Diagnosis not present

## 2020-12-02 NOTE — Patient Instructions (Signed)
Medication Instructions:  Your physician has recommended you make the following change in your medication:   You will take 1/2 tablet of Entresto tonight and tomorrow am.  Take an extra dose of Metoprolol 50mg  - 1 tablet by mouth tonight  Take Lasix 40mg  - (2- 20mg  tablets by mouth tonight)  *If you need a refill on your cardiac medications before your next appointment, please call your pharmacy*   Lab Work: None ordered.  If you have labs (blood work) drawn today and your tests are completely normal, you will receive your results only by: Marland Kitchen MyChart Message (if you have MyChart) OR . A paper copy in the mail If you have any lab test that is abnormal or we need to change your treatment, we will call you to review the results.   Testing/Procedures: None ordered.    Follow-Up: At [facility], you and your health needs are our priority.  As part of our continuing mission to provide you with exceptional heart care, we have created designated Provider Care Teams.  These Care Teams include your primary Cardiologist (physician) and Advanced Practice Providers (APPs -  Physician Assistants and Nurse Practitioners) who all work together to provide you with the care you need, when you need it.  We recommend signing up for the patient portal called "MyChart".  Sign up information is provided on this After Visit Summary.  MyChart is used to connect with patients for Virtual Visits (Telemedicine).  Patients are able to view lab/test results, encounter notes, upcoming appointments, etc.  Non-urgent messages can be sent to your provider as well.   To learn more about what you can do with MyChart, go to [email or web address].    Your next appointment:   To be determined.

## 2020-12-02 NOTE — Progress Notes (Signed)
Patient Care Team: Linus Mako, NP as PCP - General (Nurse Practitioner) Tysinger, Camelia Eng, PA-C as PCP - General (Family Medicine) Minus Breeding, MD as Consulting Physician (Cardiology) Minus Breeding, MD as Consulting Physician (Cardiology) Barbaraann Faster, RN as [place] Management   HPI  [name] is a 75 y.o. male seen in follow-up for consideration of an ICD for primary prevention in the setting of nonischemic cardiomyopathy and sarcoid-suspected based on cMRI as well as abnormal PET currently managed at [name] with prednisone with anticipation of repeat imaging and initiation of methotrexate.  On guideline directed medical therapy with persistence of left ventricular dysfunction  Also when seen 3/22 he was in atrial fibrillation with a rapid rate.  And after discussions with Dr. Reine Just he was to undergo TEE guided cardioversion having been transpositioned from aspirin to Eliquis; on the day of cardioversion he was in sinus rhythm.  Unfortunately, he is out of rhythm again with PND and DOE, abd distension     DATE TEST EF   2008 Echo 35-40%   5/13 Echo  30-35%   7/19 Echo  20-25%   12/21 cMRI 24% Concerning Sarcoid  12/21 LHC   CAD mod nonobst  3/41 PET  Metabolic active sarcoid   Date Cr K Hgb  3/22 1.72 5.0 12.3 (12/21)      Records and Results Reviewed   Past Medical History:  Diagnosis Date  . ABDOMINAL AORTIC ANEURYSM   . CAD    Non obstructive (mild) cath 2007  . CHF    EF 15% in 2007, 25% currently  . GLAUCOMA   . HYPERTENSION   . TOBACCO ABUSE    Previous    Past Surgical History:  Procedure Laterality Date  . Clavical Repair    . CORNEAL TRANSPLANT     Right eye x 2  . EYE SURGERY     Glaucoma  . RIGHT/LEFT HEART CATH AND CORONARY ANGIOGRAPHY N/A 08/08/2020   Procedure: RIGHT/LEFT HEART CATH AND CORONARY ANGIOGRAPHY;  Surgeon: Jolaine Artist, MD;  Location: [place] [facility];  Service:  Cardiovascular;  Laterality: N/A;    Current Meds  Medication Sig  . apixaban (ELIQUIS) 5 MG TABS tablet Take 1 tablet (5 mg total) by mouth 2 (two) times daily.  Marland Kitchen aspirin EC 81 MG tablet Take 81 mg by mouth daily. Swallow whole.  Marland Kitchen atorvastatin (LIPITOR) 40 MG tablet Take 40 mg by mouth daily.  . cholecalciferol (VITAMIN D) 1000 UNITS tablet Take 1,000 Units by mouth 2 (two) times daily.   . dapagliflozin propanediol (FARXIGA) 10 MG TABS tablet Take 1 tablet (10 mg total) by mouth daily.  . furosemide (LASIX) 20 MG tablet TAKE ONE TABLET BY MOUTH  PRN  . isosorbide mononitrate (IMDUR) 30 MG 24 hr tablet Take 0.5 tablets (15 mg total) by mouth daily.  . metoprolol succinate (TOPROL-XL) 100 MG 24 hr tablet Take 50 mg by mouth daily. Take with or immediately following a meal.  . Polyethylene Glycol 3350 (PEG 3350) 17 GM/SCOOP POWD TAKE 4 LITERS (CONTENTS OF CONTAINER) BY MOUTH ONCE DO NOT MIX UNTIL INSTRUCTED BY GI CLINIC (FILL WITH TAP WATER TO  LINE NEAR TOP OF BOTTLE. GENTLY SHAKE BOTTLE TO DISSOLVE POWDER, THEN DRINK AS DIRECTED BY YOUR DOCTOR  PLEASE FOLLOW INSTRUCTIONS FROM GI CLINIC. DATE OF PROCEDURE:______5/11/22 DO NOT MIX UNTIL INSTRUCTED BY GI CLINIC (FILL WITH TAP WATER TO  LINE NEAR TOP OF BOTTLE. GENTLY SHAKE  BOTTLE TO DISSOLVE POWDER, THEN DRINK AS DIRECTED BY YOUR DOCTOR  PLEASE FOLLOW INSTRUCTIONS FROM GI CLINIC. DATE OF PROCEDURE:______5/11/22  . predniSONE (DELTASONE) 10 MG tablet Take 3 tablets (30 mg total) by mouth daily. (Patient taking differently: No sig reported)  . sacubitril-valsartan (ENTRESTO) 97-103 MG Take 1 tablet by mouth 2 (two) times daily.  Marland Kitchen spironolactone (ALDACTONE) 25 MG tablet Take 1 tablet (25 mg total) by mouth daily.    No Known Allergies    Review of Systems negative except from HPI and PMH  Physical Exam BP 100/70 (BP Location: Left Arm, Patient Position: Sitting, Cuff Size: Normal)   Pulse (!) 136   Ht 5\' 10"  (1.778 m)   Wt 156 lb (70.8 kg)    SpO2 93%   BMI 22.38 kg/m  Well developed and well nourished in no acute distress HENT normal E scleral and icterus clear Neck Supple JVP 10   Clear to ausculation   Regular rate and rhythm, no murmurs gallops or rub Soft with active bowel sounds No clubbing cyanosis  Edema Alert and oriented, grossly normal motor and sensory function Skin Warm and Dry  ECG atrial fibrillation 136 recyclable-/14/38 Left axis deviation -84  Estimated Creatinine Clearance: 48.1 mL/min (A) (by C-G formula based on SCr of 1.35 mg/dL (H)).   Assessment and  Plan Cardiac sarcoid  Trifascicular block  Atrial fibrillation RVR  Nonischemic cardiomyopathy  Congestive heart failure acute/chronic/systolic   Acute CHF assoc w AFib RVR recurrent.  Having failed to hold sinus, he will need antiarrhythmic support.  Discussed dofetilide versus amiodarone the latter particularly challenging with his trifascicular block and its long time to effectiveness.  Discussed with Dr. Reine Just.  We will plan to admit for dofetilide.  Heart failure management and then cardioversion with the timing of device implantation to be determined  He is unable to come in tonight event--has to get his things taken care of at home.  The plan then will be to admit tomorrow.  In the interim, I will have him take an extra metoprolol 50 and decrease his Entresto from 97/103--one half tonight and one half tomorrow.  I will also have him take 40 of furosemide this evening  I have in the interim received from Dr Ruby Cola August Luz their sarcoid treatment protocols which I need to review and then will have to discuss internally       Current medicines are reviewed at length with the patient today .  The patient does not  have concerns regarding medicines.

## 2020-12-03 ENCOUNTER — Other Ambulatory Visit: Payer: Self-pay

## 2020-12-03 ENCOUNTER — Inpatient Hospital Stay (HOSPITAL_COMMUNITY)
Admission: RE | Admit: 2020-12-03 | Discharge: 2020-12-07 | DRG: 224 | Disposition: A | Discharge status: Home / Self Care | Payer: Medicare Other | Source: Ambulatory Visit | Attending: Internal Medicine | Admitting: Internal Medicine

## 2020-12-03 ENCOUNTER — Telehealth: Payer: Self-pay | Admitting: Internal Medicine

## 2020-12-03 ENCOUNTER — Inpatient Hospital Stay (HOSPITAL_COMMUNITY): Payer: Medicare Other

## 2020-12-03 ENCOUNTER — Encounter (HOSPITAL_COMMUNITY): Payer: Self-pay | Admitting: Internal Medicine

## 2020-12-03 DIAGNOSIS — Z20822 Contact with and (suspected) exposure to covid-19: Secondary | ICD-10-CM | POA: Diagnosis present

## 2020-12-03 DIAGNOSIS — H409 Unspecified glaucoma: Secondary | ICD-10-CM | POA: Diagnosis present

## 2020-12-03 DIAGNOSIS — I5020 Unspecified systolic (congestive) heart failure: Secondary | ICD-10-CM | POA: Diagnosis present

## 2020-12-03 DIAGNOSIS — I5023 Acute on chronic systolic (congestive) heart failure: Secondary | ICD-10-CM | POA: Diagnosis present

## 2020-12-03 DIAGNOSIS — E1151 Type 2 diabetes mellitus with diabetic peripheral angiopathy without gangrene: Secondary | ICD-10-CM | POA: Diagnosis present

## 2020-12-03 DIAGNOSIS — I509 Heart failure, unspecified: Secondary | ICD-10-CM | POA: Diagnosis not present

## 2020-12-03 DIAGNOSIS — Z7901 Long term (current) use of anticoagulants: Secondary | ICD-10-CM

## 2020-12-03 DIAGNOSIS — I480 Paroxysmal atrial fibrillation: Secondary | ICD-10-CM | POA: Diagnosis present

## 2020-12-03 DIAGNOSIS — D8685 Sarcoid myocarditis: Secondary | ICD-10-CM | POA: Diagnosis present

## 2020-12-03 DIAGNOSIS — E785 Hyperlipidemia, unspecified: Secondary | ICD-10-CM | POA: Diagnosis not present

## 2020-12-03 DIAGNOSIS — I10 Essential (primary) hypertension: Secondary | ICD-10-CM | POA: Diagnosis present

## 2020-12-03 DIAGNOSIS — Z87891 Personal history of nicotine dependence: Secondary | ICD-10-CM | POA: Diagnosis not present

## 2020-12-03 DIAGNOSIS — I5043 Acute on chronic combined systolic (congestive) and diastolic (congestive) heart failure: Secondary | ICD-10-CM

## 2020-12-03 DIAGNOSIS — I4729 Other ventricular tachycardia: Secondary | ICD-10-CM

## 2020-12-03 DIAGNOSIS — I429 Cardiomyopathy, unspecified: Secondary | ICD-10-CM

## 2020-12-03 DIAGNOSIS — I517 Cardiomegaly: Secondary | ICD-10-CM | POA: Diagnosis not present

## 2020-12-03 DIAGNOSIS — I428 Other cardiomyopathies: Secondary | ICD-10-CM | POA: Diagnosis present

## 2020-12-03 DIAGNOSIS — F17200 Nicotine dependence, unspecified, uncomplicated: Secondary | ICD-10-CM | POA: Diagnosis present

## 2020-12-03 DIAGNOSIS — I2510 Atherosclerotic heart disease of native coronary artery without angina pectoris: Secondary | ICD-10-CM | POA: Diagnosis present

## 2020-12-03 DIAGNOSIS — I453 Trifascicular block: Secondary | ICD-10-CM | POA: Diagnosis present

## 2020-12-03 DIAGNOSIS — I4819 Other persistent atrial fibrillation: Secondary | ICD-10-CM

## 2020-12-03 DIAGNOSIS — I779 Disorder of arteries and arterioles, unspecified: Secondary | ICD-10-CM | POA: Diagnosis present

## 2020-12-03 DIAGNOSIS — I440 Atrioventricular block, first degree: Secondary | ICD-10-CM | POA: Diagnosis present

## 2020-12-03 DIAGNOSIS — R001 Bradycardia, unspecified: Secondary | ICD-10-CM | POA: Diagnosis present

## 2020-12-03 DIAGNOSIS — Z79899 Other long term (current) drug therapy: Secondary | ICD-10-CM | POA: Diagnosis not present

## 2020-12-03 DIAGNOSIS — I130 Hypertensive heart and chronic kidney disease with heart failure and stage 1 through stage 4 chronic kidney disease, or unspecified chronic kidney disease: Secondary | ICD-10-CM | POA: Diagnosis present

## 2020-12-03 DIAGNOSIS — N1831 Chronic kidney disease, stage 3a: Secondary | ICD-10-CM | POA: Diagnosis present

## 2020-12-03 DIAGNOSIS — Z7982 Long term (current) use of aspirin: Secondary | ICD-10-CM | POA: Diagnosis not present

## 2020-12-03 DIAGNOSIS — E1122 Type 2 diabetes mellitus with diabetic chronic kidney disease: Secondary | ICD-10-CM | POA: Diagnosis present

## 2020-12-03 DIAGNOSIS — Z95810 Presence of automatic (implantable) cardiac defibrillator: Secondary | ICD-10-CM

## 2020-12-03 DIAGNOSIS — I714 Abdominal aortic aneurysm, without rupture: Secondary | ICD-10-CM | POA: Diagnosis present

## 2020-12-03 DIAGNOSIS — E119 Type 2 diabetes mellitus without complications: Secondary | ICD-10-CM

## 2020-12-03 DIAGNOSIS — I4891 Unspecified atrial fibrillation: Secondary | ICD-10-CM | POA: Diagnosis present

## 2020-12-03 DIAGNOSIS — I472 Ventricular tachycardia: Secondary | ICD-10-CM | POA: Diagnosis not present

## 2020-12-03 DIAGNOSIS — I459 Conduction disorder, unspecified: Secondary | ICD-10-CM | POA: Diagnosis present

## 2020-12-03 LAB — BRAIN NATRIURETIC PEPTIDE: B Natriuretic Peptide: >4500 pg/mL — ABNORMAL HIGH (ref 0.0–100.0)

## 2020-12-03 LAB — COMPREHENSIVE METABOLIC PANEL
ALT: 41 U/L (ref 0–44)
AST: 29 U/L (ref 15–41)
Albumin: 3.2 g/dL — ABNORMAL LOW (ref 3.5–5.0)
Alkaline Phosphatase: 71 U/L (ref 38–126)
Anion gap: 6 (ref 5–15)
BUN: 29 mg/dL — ABNORMAL HIGH (ref 8–23)
CO2: 29 mmol/L (ref 22–32)
Calcium: 8.9 mg/dL (ref 8.9–10.3)
Chloride: 108 mmol/L (ref 98–111)
Creatinine, Ser: 1.94 mg/dL — ABNORMAL HIGH (ref 0.61–1.24)
GFR, Estimated: 36 mL/min — ABNORMAL LOW (ref >60)
Glucose, Bld: 105 mg/dL — ABNORMAL HIGH (ref 70–99)
Potassium: 3.8 mmol/L (ref 3.5–5.1)
Sodium: 143 mmol/L (ref 135–145)
Total Bilirubin: 1.5 mg/dL — ABNORMAL HIGH (ref 0.3–1.2)
Total Protein: 6.3 g/dL — ABNORMAL LOW (ref 6.5–8.1)

## 2020-12-03 LAB — CBC WITH DIFFERENTIAL/PLATELET
Abs Immature Granulocytes: 0.04 10*3/uL (ref 0.00–0.07)
Basophils Absolute: 0.1 10*3/uL (ref 0.0–0.1)
Basophils Relative: 1 %
Eosinophils Absolute: 0.1 10*3/uL (ref 0.0–0.5)
Eosinophils Relative: 1 %
HCT: 40.2 % (ref 39.0–52.0)
Hemoglobin: 13.3 g/dL (ref 13.0–17.0)
Immature Granulocytes: 1 %
Lymphocytes Relative: 26 %
Lymphs Abs: 2.2 10*3/uL (ref 0.7–4.0)
MCH: 34.6 pg — ABNORMAL HIGH (ref 26.0–34.0)
MCHC: 33.1 g/dL (ref 30.0–36.0)
MCV: 104.7 fL — ABNORMAL HIGH (ref 80.0–100.0)
Monocytes Absolute: 0.8 10*3/uL (ref 0.1–1.0)
Monocytes Relative: 10 %
Neutro Abs: 5.1 10*3/uL (ref 1.7–7.7)
Neutrophils Relative %: 61 %
Platelets: 99 10*3/uL — ABNORMAL LOW (ref 150–400)
RBC: 3.84 MIL/uL — ABNORMAL LOW (ref 4.22–5.81)
RDW: 18.2 % — ABNORMAL HIGH (ref 11.5–15.5)
WBC: 8.3 10*3/uL (ref 4.0–10.5)
nRBC: 0.4 % — ABNORMAL HIGH (ref 0.0–0.2)

## 2020-12-03 LAB — MAGNESIUM: Magnesium: 1.8 mg/dL (ref 1.7–2.4)

## 2020-12-03 LAB — SARS CORONAVIRUS 2 (TAT 6-24 HRS): SARS Coronavirus 2: NEGATIVE

## 2020-12-03 MED ORDER — SACUBITRIL-VALSARTAN 97-103 MG PO TABS
1.0000 | ORAL_TABLET | Freq: Two times a day (BID) | ORAL | Status: DC
  Filled 2020-12-03: qty 1

## 2020-12-03 MED ORDER — PREDNISONE 10 MG PO TABS: 10.0000 mg | ORAL_TABLET | Freq: Every day | ORAL | Status: DC

## 2020-12-03 MED ORDER — METOPROLOL SUCCINATE ER 50 MG PO TB24: 50.0000 mg | ORAL_TABLET | Freq: Every day | ORAL | Status: DC

## 2020-12-03 MED ORDER — ATORVASTATIN CALCIUM 40 MG PO TABS
40.0000 mg | ORAL_TABLET | Freq: Every day | ORAL | Status: DC
  Administered 2020-12-03 – 2020-12-06 (×4): 40 mg via ORAL
  Filled 2020-12-03 (×4): qty 1

## 2020-12-03 MED ORDER — ASPIRIN EC 81 MG PO TBEC: 81.0000 mg | DELAYED_RELEASE_TABLET | Freq: Every day | ORAL | Status: DC

## 2020-12-03 MED ORDER — ISOSORBIDE MONONITRATE ER 30 MG PO TB24
15.0000 mg | ORAL_TABLET | Freq: Every day | ORAL | Status: DC
  Administered 2020-12-04 – 2020-12-07 (×4): 15 mg via ORAL
  Filled 2020-12-03 (×4): qty 1

## 2020-12-03 MED ORDER — APIXABAN 5 MG PO TABS
5.0000 mg | ORAL_TABLET | Freq: Two times a day (BID) | ORAL | Status: DC
  Administered 2020-12-04: 5 mg via ORAL
  Filled 2020-12-03 (×2): qty 1

## 2020-12-03 MED ORDER — ACETAMINOPHEN 325 MG PO TABS
650.0000 mg | ORAL_TABLET | ORAL | Status: DC | PRN
Start: 2020-12-03 — End: 2020-12-05

## 2020-12-03 MED ORDER — SODIUM CHLORIDE 0.9 % IV SOLN: 250.0000 mL | INTRAVENOUS | Status: DC | PRN

## 2020-12-03 MED ORDER — ORAL CARE MOUTH RINSE
15.0000 mL | Freq: Two times a day (BID) | OROMUCOSAL | Status: DC
  Administered 2020-12-04 – 2020-12-07 (×3): 15 mL via OROMUCOSAL

## 2020-12-03 MED ORDER — PREDNISONE 20 MG PO TABS
30.0000 mg | ORAL_TABLET | Freq: Every day | ORAL | Status: DC
  Administered 2020-12-03 – 2020-12-07 (×5): 30 mg via ORAL
  Filled 2020-12-03 (×6): qty 1

## 2020-12-03 MED ORDER — DAPAGLIFLOZIN PROPANEDIOL 10 MG PO TABS
10.0000 mg | ORAL_TABLET | Freq: Every day | ORAL | Status: DC
  Administered 2020-12-04 – 2020-12-05 (×2): 10 mg via ORAL
  Filled 2020-12-03 (×2): qty 1

## 2020-12-03 MED ORDER — APIXABAN 5 MG PO TABS
5.0000 mg | ORAL_TABLET | Freq: Two times a day (BID) | ORAL | Status: DC
  Administered 2020-12-03: 5 mg via ORAL
  Filled 2020-12-03: qty 1

## 2020-12-03 MED ORDER — FUROSEMIDE 10 MG/ML IJ SOLN
40.0000 mg | Freq: Two times a day (BID) | INTRAMUSCULAR | Status: DC
  Administered 2020-12-03 – 2020-12-04 (×2): 40 mg via INTRAVENOUS
  Filled 2020-12-03 (×2): qty 4

## 2020-12-03 MED ORDER — SODIUM CHLORIDE 0.9% FLUSH: 3.0000 mL | INTRAVENOUS | Status: DC | PRN

## 2020-12-03 MED ORDER — VITAMIN D 25 MCG (1000 UNIT) PO TABS
1000.0000 [IU] | ORAL_TABLET | Freq: Two times a day (BID) | ORAL | Status: DC
  Administered 2020-12-03 – 2020-12-07 (×8): 1000 [IU] via ORAL
  Filled 2020-12-03 (×8): qty 1

## 2020-12-03 MED ORDER — METOPROLOL SUCCINATE ER 50 MG PO TB24
50.0000 mg | ORAL_TABLET | Freq: Two times a day (BID) | ORAL | Status: DC
  Administered 2020-12-03: 50 mg via ORAL
  Filled 2020-12-03 (×2): qty 1

## 2020-12-03 MED ORDER — SPIRONOLACTONE 25 MG PO TABS: 25.0000 mg | ORAL_TABLET | Freq: Every day | ORAL | Status: DC

## 2020-12-03 MED ORDER — SODIUM CHLORIDE 0.9% FLUSH
3.0000 mL | Freq: Two times a day (BID) | INTRAVENOUS | Status: DC
  Administered 2020-12-03 – 2020-12-04 (×3): 3 mL via INTRAVENOUS

## 2020-12-03 NOTE — Consult Note (Addendum)
Advanced Heart Failure Team Consult Note   Primary Physician: Linus Mako, NP PCP-Cardiologist:  No primary care provider on file.  HF MD: Dr Haroldine Laws EP : Dr [name]   Reason for Consultation: Heart Failure  HPI:    [name] is seen today for evaluation of heart failure  at the request of Dr [name].   [name] is a 75 year old with a history of chronic systolic heart failure w/ biventricular dysfunction. Notes indicate he has a NICM.  Echos dating back to 2008 have shown chronic systolic heart failure, LVEF has been as low as 20%. Most recent echo 11/21 showed EF 20-25%, diffuse HK, G2DD, w/ moderately reduced RV systolic functionand moderate Robert. Other PMH includes HTN, Stage III CKD (baseline SCr ~1.5), PAD s/p bifem bypass and aorto-iliac aneursym repair, tobacco use,and abnormal chest CT 11/21 showing bilateral pulmonary nodules and mild mediastinal and hilar adenopathy.Quit smoking 2021.  Admitted in 07/2020 with ADHD. HF meds adjusted to GDMT. Had cath that showed moderate non obstructive CAD. EF remained < 35%. CMRI suggestive of diffuse cardiac sarcoid. Set up for outpatient cardiac pet and referred to Dr Marcene Brawn at [facility]. Discharged on prednisone + bactrim. Discharge weight 139 pounds.   He was seen by Dr [name] 10/28/20 for ICD consideration. At that time he was in  Afib and set up for TEE/DC-CV. Started on eliquis. He presented for cardioversion on 11/01/20 and was back in NSR so cardioversion cancelled.  He then wore  Zio and had 7% A  fib burden 7%. He never started eliquis.  Yesterday he was evaluated by Dr [name] was back in A fib RVR.  Feels like he went in A fib about 1 week ago and has noticed increased shortness breath with exertion and endorses orthopnea. He was set up for direct admit and tikosyn load. He has not started taking eliquis and he has only been taking prednisone 10 mg daily instead of 30 mg daily. Says he was concerned  about weight gain .  Today he is  short of breath sitting up and talking.    Cardiac Testing  -Cardiac PET- Cardiac Sarcoid -CMRI 07/2020 EF < 35%, concerning for diffuse sarcoid -ECHO 07/2020 EF 30-35 - LHC 07/2020 non obstructive cad   Review of Systems: [y] = yes, [ ]  = no   . General: Weight gain [Y ]; Weight loss [ ] ; Anorexia [ ] ; Fatigue [ Y]; Fever [ ] ; Chills [ ] ; Weakness [Y ]  . Cardiac: Chest pain/pressure [ ] ; Resting SOB [ ] ; Exertional SOB [ Y]; Orthopnea [ Y]; Pedal Edema [ ] ; Palpitations [ ] ; Syncope [ ] ; Presyncope [ ] ; Paroxysmal nocturnal dyspnea[ ]   . Pulmonary: Cough [ ] ; Wheezing[ ] ; Hemoptysis[ ] ; Sputum [ ] ; Snoring [ ]   . GI: Vomiting[ ] ; Dysphagia[ ] ; Melena[ ] ; Hematochezia [ ] ; Heartburn[ ] ; Abdominal pain [ ] ; Constipation [ ] ; Diarrhea [ ] ; BRBPR [ ]   . GU: Hematuria[ ] ; Dysuria [ ] ; Nocturia[ ]   . Vascular: Pain in legs with walking [ ] ; Pain in feet with lying flat [ ] ; Non-healing sores [ ] ; Stroke [ ] ; TIA [ ] ; Slurred speech [ ] ;  . Neuro: Headaches[ ] ; Vertigo[ ] ; Seizures[ ] ; Paresthesias[ ] ;Blurred vision [ ] ; Diplopia [ ] ; Vision changes [ ]   . Ortho/Skin: Arthritis [ ] ; Joint pain [Y ]; Muscle pain [ ] ; Joint swelling [ ] ; Back Pain [ Y]; Rash [ ]   . Psych: Depression[ ] ;  Anxiety[ ]   . Heme: Bleeding problems [ ] ; Clotting disorders [ ] ; Anemia [ ]   . Endocrine: Diabetes [ ] ; Thyroid dysfunction[ ]   Home Medications Prior to Admission medications   Medication Sig Start Date End Date Taking? Authorizing Provider  apixaban (ELIQUIS) 5 MG TABS tablet Take 1 tablet (5 mg total) by mouth 2 (two) times daily. 10/29/20   Clegg, Amy D, NP  aspirin EC 81 MG tablet Take 81 mg by mouth daily. Swallow whole.    [provider]  atorvastatin (LIPITOR) 40 MG tablet Take 40 mg by mouth daily. 10/23/20   [provider]  cholecalciferol (VITAMIN D) 1000 UNITS tablet Take 1,000 Units by mouth 2 (two) times daily.     [provider]  dapagliflozin propanediol (FARXIGA) 10  MG TABS tablet Take 1 tablet (10 mg total) by mouth daily. 10/29/20   Clegg, Amy D, NP  furosemide (LASIX) 20 MG tablet TAKE ONE TABLET BY MOUTH  PRN 10/31/20   [provider]  isosorbide mononitrate (IMDUR) 30 MG 24 hr tablet Take 0.5 tablets (15 mg total) by mouth daily. 10/29/20   Clegg, Amy D, NP  metoprolol succinate (TOPROL-XL) 100 MG 24 hr tablet Take 50 mg by mouth daily. Take with or immediately following a meal.    [provider]  Polyethylene Glycol 3350 (PEG 3350) 17 GM/SCOOP POWD TAKE 4 LITERS (CONTENTS OF CONTAINER) BY MOUTH ONCE DO NOT MIX UNTIL INSTRUCTED BY GI CLINIC (FILL WITH TAP WATER TO  LINE NEAR TOP OF BOTTLE. GENTLY SHAKE BOTTLE TO DISSOLVE POWDER, THEN DRINK AS DIRECTED BY YOUR DOCTOR  PLEASE FOLLOW INSTRUCTIONS FROM GI CLINIC. DATE OF PROCEDURE:______5/11/22 DO NOT MIX UNTIL INSTRUCTED BY GI CLINIC (FILL WITH TAP WATER TO  LINE NEAR TOP OF BOTTLE. GENTLY SHAKE BOTTLE TO DISSOLVE POWDER, THEN DRINK AS DIRECTED BY YOUR DOCTOR  PLEASE FOLLOW INSTRUCTIONS FROM GI CLINIC. DATE OF PROCEDURE:______5/11/22 11/04/20   [provider]  predniSONE (DELTASONE) 10 MG tablet Take 3 tablets (30 mg total) by mouth daily. Patient taking differently: No sig reported 09/02/20   Clegg, Amy D, NP  sacubitril-valsartan (ENTRESTO) 97-103 MG Take 1 tablet by mouth 2 (two) times daily. 05/19/18   Minus Breeding, MD  spironolactone (ALDACTONE) 25 MG tablet Take 1 tablet (25 mg total) by mouth daily. 10/29/20   [place]rad [place], NP    Past Medical History: Past Medical History:  Diagnosis Date  . ABDOMINAL AORTIC ANEURYSM   . CAD    Non obstructive (mild) cath 2007  . CHF    EF 15% in 2007, 25% currently  . GLAUCOMA   . HYPERTENSION   . TOBACCO ABUSE    Previous    Past Surgical History: Past Surgical History:  Procedure Laterality Date  . Clavical Repair    . CORNEAL TRANSPLANT     Right eye x 2  . EYE SURGERY     Glaucoma  . RIGHT/LEFT HEART CATH AND CORONARY  ANGIOGRAPHY N/A 08/08/2020   Procedure: RIGHT/LEFT HEART CATH AND CORONARY ANGIOGRAPHY;  Surgeon: Jolaine Artist, MD;  Location: [facility];  Service: Cardiovascular;  Laterality: N/A;    Family History: No family history on file.  Social History: Social History   Socioeconomic History  . Marital status: Single    Spouse name: Not on file  . Number of children: 5  . Years of education: high school graduate with 3 years of college  . Highest education level: Some college, no degree  Occupational History  . 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 Smoking status: Former Smoker    Quit date: 03/08/2008    Years since quitting: 12.7  . Smokeless tobacco: Never Used  Substance and Sexual Activity  . Alcohol use: No  . Drug use: No  . Sexual activity: Not on file  Other Topics Concern  . Not on file  Social History Narrative   single male who lives alone but with the support of his daughter Edker Punt. He is doing well with his care needs    He works in [employer] part time.   high school graduate with 3 years of college   5 children    Male pit bull in home- [place]   Social Determinants of Health   Financial Resource Strain: Not on file  Food Insecurity: No Food Insecurity  . Worried About Charity fundraiser in the Last Year: Never true  . Ran Out of Food in the Last Year: Never true  Transportation Needs: No Transportation Needs  . Lack of Transportation (Medical): No  . Lack of Transportation (Non-Medical): No  Physical Activity: Not on file  Stress: No Stress Concern Present  . Feeling of Stress : Only a little  Social Connections: Not on file    Allergies:  No Known Allergies  Objective:    Vital Signs:   Temp:  [97.6 F (36.4 C)] 97.6 F (36.4 C) (04/12 1336) Pulse Rate:  [117] 117 (04/12 1336) Resp:  [20] 20 (04/12 1336) BP: (125)/(103) 125/103 (04/12 1336) SpO2:  [100 %] 100 % (04/12 1336)     Weight change: There were no vitals filed for this visit.  Intake/Output:  No intake or output data in the 24 hours ending 12/03/20 1358    Physical Exam    General:  Dyspneic with minimal exertion.  HEENT: normal Neck: supple. JVP 11-12  . Carotids 2+ bilat; no bruits. No lymphadenopathy or thyromegaly appreciated. Cor: PMI nondisplaced. Tachy Irregular rate & rhythm. No rubs, gallops or murmurs. Lungs: clear Abdomen: soft, nontender, nondistended. No hepatosplenomegaly. No bruits or masses. Good bowel sounds. Extremities: no cyanosis, clubbing, rash, edema Neuro: alert & orientedx3, cranial nerves grossly intact. moves all 4 extremities w/o difficulty. Affect pleasant   Telemetry   A Fib RVR with occasional PVCs.   EKG    None   Labs   Basic Metabolic Panel: No results for input(s): NA, K, CL, CO2, GLUCOSE, BUN, CREATININE, CALCIUM, MG, PHOS in the last 168 hours.  Liver Function Tests: No results for input(s): AST, ALT, ALKPHOS, BILITOT, PROT, ALBUMIN in the last 168 hours. No results for input(s): LIPASE, AMYLASE in the last 168 hours. No results for input(s): AMMONIA in the last 168 hours.  CBC: No results for input(s): WBC, NEUTROABS, HGB, HCT, MCV, PLT in the last 168 hours.  Cardiac Enzymes: No results for input(s): CKTOTAL, CKMB, CKMBINDEX, TROPONINI in the last 168 hours.  BNP: BNP (last 3 results) Recent Labs    06/30/20 2354 08/06/20 0941  BNP 3,205.6* >4,500.0*    ProBNP (last 3 results) No results for input(s): PROBNP in the last 8760 hours.   CBG: No results for input(s): GLUCAP in the last 168 hours.  Coagulation Studies: No results for input(s): LABPROT, INR in the last 72 hours.   Imaging    No results found.   Medications:     Current Medications:    Infusions:  Assessment/Plan   1. A fib RVR -Saw EP yesterday and was back in A fib RVR. Set up for direct admit and Tikosyn.  - He has not started eliquis.  We will start eliquis 5 mg twice a day - Plan for TEE/DC-CV after 3 dose. ?Thurday morning.  - Continue Toprol XL would not increase with conduction issues associated with sarcoid.   2. Cardiac Sarcoid - Followed by Dr Marcene Brawn at [facility].  -07/2020 CMRI suggestive sarcoid--> Cardiac Pet--> Cardiac Sarcoid -Referred to Dr Marcene Brawn at [facility] and was evaluated 3/2 with plans to continue prednisone and repeat Pet after 3 months. At that point consider methotrexate.  - Supposed to be on prednisione 30 mg daily but has only been on 10 mg daily.  - Start prednisone 30 mg daily  3. Acute/ Chronic Combined HFrEF indicate nonobstructive CAD on cath in 2007, study report not on file) - Most recent echo 11/21 LVEF 20-25%, G2DD, diffuse HK, moderately reduced RV - cMRI suggestive of sarcoid. EF 24% - Cardiac Pet as noted above with cardiac sarcoid - Presenting with NYHA IV symptoms.  - Volume overloaded in the setting of A fib RVR. Will start 40 mg IV lasix twice a day.   - Continue Toprol XL as noted above - Hold entresto/spiro until we see what renal function looks like.  - CXR now.   4. CKD Stage IIIa Creatinine  Baseline 08-27-13. Check BMET now.   5. PVCs/NSVT   6. PAD  -S/P Bifem BPG and aortoiliac aneurysm repair - Continue statin.   Will need to spend some time sorting out his medications. He has been getting medications from the [place] so will need to see what is on record with them. Will need 30 day supply of eliquis at d/c.   Length of Stay: 0  Darrick Grinder, NP  12/03/2020, 1:58 PM  Advanced Heart Failure Team Pager [phone] (M-F; 7a - 5p)  Please contact [facility] for night-coverage after hours (4p -7a ) and weekends on [email or web address]  Patient seen with NP, agree with the above note.   He is admitted with recurrent AF/RVR, HR in 110s currently.  He has also been short of breath.  He has cardiac sarcoidosis with prior cMRI in 12/21 showing LV EF 24%.  Cardiac PET and cMRI suggestive of cardiac  sarcoidosis, has been on prednisone at home but not taking full dose (10 mg rather than 30 mg).   General: NAD Neck: JVP 14-16 cm, no thyromegaly or thyroid nodule.  Lungs: Clear to auscultation bilaterally with normal respiratory effort. CV: Nondisplaced PMI.  Heart tachy, irregular S1/S2, no S3/S4, no murmur.  No peripheral edema.   Abdomen: Soft, nontender, no hepatosplenomegaly, no distention.  Skin: Intact without lesions or rashes.  Neurologic: Alert and oriented x 3.  Psych: Normal affect. Extremities: No clubbing or cyanosis.  HEENT: Normal.   Patient has not yet started Eliquis.  Will start today with plan for TEE-guided DCCV likely on Thursday.  He can start on Tikosyn after the TEE (had been planned for Tikosyn admit but did not start Eliquis).  Will control rate for now with Toprol XL, can increase to 50 mg bid for the time being.  Would not start amiodarone gtt given plan to use Tikosyn after TEE-DCCV.   He is volume overloaded on exam.  Take Lasix only prn at home.  Start Lasix 40 mg IV bid.  Creatinine up to 1.9, baseline 1.3-1.5. Hold Entresto and spironolactone for now, can restart  as creatinine trends down.   Loralie Champagne 12/03/2020 4:33 PM

## 2020-12-03 NOTE — Progress Notes (Signed)
   12/03/20 1336  Assess: MEWS Score  Temp 97.6 F (36.4 C)  BP (!) 125/103  Pulse Rate (!) 117  ECG Heart Rate (!) 117  Resp 20  Level of Consciousness Alert  SpO2 100 %  O2 Device Room Air  Assess: MEWS Score  MEWS Temp 0  MEWS Systolic 0  MEWS Pulse 2  MEWS RR 0  MEWS LOC 0  MEWS Score 2  MEWS Score Color Yellow  Assess: if the MEWS score is Yellow or Red  Were vital signs taken at a resting state? Yes  Focused Assessment No change from prior assessment  Early Detection of Sepsis Score *See Row Information* Medium  MEWS guidelines implemented *See Row Information* Yes  Treat  Pain Scale 0-10  Pain Score 0  Take Vital Signs  Increase Vital Sign Frequency  Yellow: Q 2hr X 2 then Q 4hr X 2, if remains yellow, continue Q 4hrs  Escalate  MEWS: Escalate Yellow: discuss with charge nurse/RN and consider discussing with provider and RRT  Notify: Charge Nurse/RN  Name of Charge Nurse/RN Notified Colletta Maryland RN  Date Charge Nurse/RN Notified 12/03/20  Time Charge Nurse/RN Notified 1336  Document  Patient Outcome Other (Comment) (stable in room)  Progress note created (see row info) Yes

## 2020-12-03 NOTE — Telephone Encounter (Signed)
Patient is requesting to speak with Dr. Olin Pia nurse. He states he was told someone from the [facility] would be contacting him to schedule, but he has not heard from them. Do we have an update for him? Please advise.

## 2020-12-03 NOTE — H&P (Addendum)
Cardiology Admission History and Physical:   Patient ID: [name] MRN: [id number]; DOB: 09/27/1945   Admission date: 12/03/2020  PCP:  Linus Mako, NP   [place]  Cardiologist:  Dr. Percival Spanish Electrophysiologist:  Dr. [name] [facility]: cardiology: Dr. Hiram Gash [id number]}  Follows with [facility]  Chief Complaint:  AFib, Acute/chronic CHF  Patient Profile:   [name] is a 75 y.o. male with PMHx of chronic CHF (combined), NICM, also mod reduced RVEF, HTN, CKD (III), former smoker (chest CT 11/21 showing bilateral pulmonary nodules and mild mediastinal and hilar adenopathy), PAD )s/p bifem bypass and aorto-iliac aneursym repair), AFib, PVCs, NSVT, RBBB (trifascicular block)  History of Present Illness:   Mr. Vandyne was hospitalized in Dec with acute/chronic CHF exacerbation, AHF team was brought on board, his cardiac work up at that time noted RHC/LHC 07/2020 -moderate non-obstructive CAD Echo 11/21 LVEF 20-25%, G2DD, diffuse HK, moderately reduced RV CMRI 07/2020  - LV EF 24% These findings are consistent with non-ischemic cardiomyopathy and are highly suspicious for diffuse cardiac sarcoidosis. ECV is very elevated at 45% (amyloid range, however no other findings suggestive of cardiac amyloidosis), however most probably represent diffuse scarring.  He was started on prednisone PET done at [facility] was abnormal with metabolic active sarcoid, with plans to repeat and possible initiation of methotrexate.  He was referred to dr. [name] for consideration of an ICD, he saw him 10/28/20 found in AFib w/RVR, he did feel in setting of baseline conduction system disease and sarcoid, device in NICM w/RBBB was reasonable Though 1st planned to start Eliquis, and proceed with TEE/DCCV with eventual device.  He arrived for his TEE/DCCV in SR, Dr. [name] saw him yesterday again back in AFib w/RVR and felt to be volume OL.  In d/w Dr. Haroldine Laws, Dr. [name] felt initial  efforts should be rhythm control and HF management with device implant down the road. Planned to increase home lasix yesterday and admit to EP service for Tikosyn initiation with HF team consulting to manage his diuresis.  He is feeling less SOB today then yesterday that after taking the 40mg  lasix yesterday did have good urine output. He denies resting SOB, still winded with ambulation today No CP  The patient has not started the eliquis, states he gets his medicines in the mail via the [place] and has not received it yet  He thinks he went back out of rhythm watching the basketball tournament  9-10days ago, could tell something was off and felt unusually winded with exertion  Past Medical History:  Diagnosis Date  . ABDOMINAL AORTIC ANEURYSM   . CAD    Non obstructive (mild) cath 2007  . CHF    EF 15% in 2007, 25% currently  . GLAUCOMA   . HYPERTENSION   . TOBACCO ABUSE    Previous    Past Surgical History:  Procedure Laterality Date  . Clavical Repair    . CORNEAL TRANSPLANT     Right eye x 2  . EYE SURGERY     Glaucoma  . RIGHT/LEFT HEART CATH AND CORONARY ANGIOGRAPHY N/A 08/08/2020   Procedure: RIGHT/LEFT HEART CATH AND CORONARY ANGIOGRAPHY;  Surgeon: Jolaine Artist, MD;  Location: [facility];  Service: Cardiovascular;  Laterality: N/A;     Medications Prior to Admission: Prior to Admission medications   Medication Sig Start Date End Date Taking? Authorizing Provider  apixaban (ELIQUIS) 5 MG TABS tablet Take 1 tablet (5 mg total) by mouth  2 (two) times daily. 10/29/20   Clegg, Amy D, NP  aspirin EC 81 MG tablet Take 81 mg by mouth daily. Swallow whole.    [provider]  atorvastatin (LIPITOR) 40 MG tablet Take 40 mg by mouth daily. 10/23/20   [provider]  cholecalciferol (VITAMIN D) 1000 UNITS tablet Take 1,000 Units by mouth 2 (two) times daily.     [provider]  dapagliflozin propanediol (FARXIGA) 10 MG TABS tablet Take 1 tablet  (10 mg total) by mouth daily. 10/29/20   Clegg, Amy D, NP  furosemide (LASIX) 20 MG tablet TAKE ONE TABLET BY MOUTH  PRN 10/31/20   [provider]  isosorbide mononitrate (IMDUR) 30 MG 24 hr tablet Take 0.5 tablets (15 mg total) by mouth daily. 10/29/20   Clegg, Amy D, NP  metoprolol succinate (TOPROL-XL) 100 MG 24 hr tablet Take 50 mg by mouth daily. Take with or immediately following a meal.    [provider]  Polyethylene Glycol 3350 (PEG 3350) 17 GM/SCOOP POWD TAKE 4 LITERS (CONTENTS OF CONTAINER) BY MOUTH ONCE DO NOT MIX UNTIL INSTRUCTED BY GI CLINIC (FILL WITH TAP WATER TO  LINE NEAR TOP OF BOTTLE. GENTLY SHAKE BOTTLE TO DISSOLVE POWDER, THEN DRINK AS DIRECTED BY YOUR DOCTOR  PLEASE FOLLOW INSTRUCTIONS FROM GI CLINIC. DATE OF PROCEDURE:______5/11/22 DO NOT MIX UNTIL INSTRUCTED BY GI CLINIC (FILL WITH TAP WATER TO  LINE NEAR TOP OF BOTTLE. GENTLY SHAKE BOTTLE TO DISSOLVE POWDER, THEN DRINK AS DIRECTED BY YOUR DOCTOR  PLEASE FOLLOW INSTRUCTIONS FROM GI CLINIC. DATE OF PROCEDURE:______5/11/22 11/04/20   [provider]  predniSONE (DELTASONE) 10 MG tablet Take 3 tablets (30 mg total) by mouth daily. Patient taking differently: No sig reported 09/02/20   Clegg, Amy D, NP  sacubitril-valsartan (ENTRESTO) 97-103 MG Take 1 tablet by mouth 2 (two) times daily. 05/19/18   Minus Breeding, MD  spironolactone (ALDACTONE) 25 MG tablet Take 1 tablet (25 mg total) by mouth daily. 10/29/20   Clegg, Amy D, NP     Allergies:   No Known Allergies  Social History:   Social History   Socioeconomic History  . Marital status: Single    Spouse name: Not on file  . Number of children: 5  . Years of education: high school graduate with 3 years of college  . Highest education level: Some college, no degree  Occupational History  . 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 Smoking status: Former Smoker    Quit date: 03/08/2008    Years since quitting: 12.7   . Smokeless tobacco: Never Used  Substance and Sexual Activity  . Alcohol use: No  . Drug use: No  . Sexual activity: Not on file  Other Topics Concern  . Not on file  Social History Narrative   single male who lives alone but with the support of his daughter Giovanni Biby. He is doing well with his care needs    He works in [employer] part time.   high school graduate with 3 years of college   5 children    Male pit bull in home- [place]   Social Determinants of Health   Financial Resource Strain: Not on file  Food Insecurity: No Food Insecurity  . Worried About Charity fundraiser in the Last Year: Never true  . Ran Out of Food in the Last Year: Never true  Transportation Needs: No Transportation Needs  . Lack  of Transportation (Medical): No  . Lack of Transportation (Non-Medical): No  Physical Activity: Not on file  Stress: No Stress Concern Present  . Feeling of Stress : Only a little  Social Connections: Not on file  Intimate Partner Violence: Not At Risk  . Fear of Current or Ex-Partner: No  . Emotionally Abused: No  . Physically Abused: No  . Sexually Abused: No    Family History:   The patient's family history includes Diabetes in his brother, mother, and sister.    ROS:  Please see the history of present illness.  All other ROS reviewed and negative.     Physical Exam/Data:   Vitals:   12/03/20 1336  BP: (!) 125/103  Pulse: (!) 117  Resp: 20  Temp: 97.6 F (36.4 C)  TempSrc: Oral  SpO2: 100%  Height: 5' 10.5" (1.791 m)   No intake or output data in the 24 hours ending 12/03/20 1401 Last 3 Weights 12/02/2020 11/01/2020 10/28/2020  Weight (lbs) 156 lb 150 lb 155 lb  Weight (kg) 70.761 kg 68.04 kg 70.308 kg     Body mass index is 22.07 kg/m.  General:  Well nourished, well developed, in no acute distress HEENT: normal (eye patch R eye) Lymph: no adenopathy Neck: no JVD Endocrine:  No thryomegaly Vascular: No carotid bruits Cardiac:   irreg-irreg, tachycardic; no murmurs, gallops or rubs Lungs: diminished bases /l, soft crackles R, no wheezing, rhonchi or rales  Abd: soft, nontender Ext: trace edema Musculoskeletal:  No deformities Skin: warm and dry  Neuro:  no focal abnormalities noted Psych:  Normal affect    EKG:  The ECG that was done yesterday  was personally reviewed ((by Dr. [name] as well) demonstrates  Afib, RBBB, LAD, 136bpm  11/01/20 is SR 68bpm, 1st degree AVblock 285ms, RBBB, LAD  Relevant CV Studies:  March 2022, 2 week monitor 1. Sinus Rhythm. First Degree AV Block was present - avg HR of 80 bpm. 2. Bundle Branch Block/IVCD was present.  3. 43 runs of nonsustained ventricular tachycardia occurred, the run with the  fastest interval lasting 6 beats with a max rate of 214 bpm, the longest lasting 14 beats with an avg rate of 162 bpm.  4. Atrial Fibrillation occurred (7% burden), ranging from 75-186 bpm (avg of 135 bpm), the longest lasting 11 hours 8 mins with an avg rate of 139 bpm.  5. Rare PACs 6. Occasional PVCs (3.4%, [id number])    10/21/20; PET/CT Impression:  1. Findings are suggestive of inflammatory process involving predominantly right atrium, 5/17 segments of the left ventricle and  to a lesser degree left atrium.  2. Coronary artery calcifications and aortic calcifications.  3. No evidence of metabolically active sarcoid outside of the heart.    RHC/LHC 07/2020 -moderate non-obstructive CAD Echo 11/21 LVEF 20-25%, G2DD, diffuse HK, moderately reduced RV CMRI 07/2020  - LV EF 24% These findings are consistent with non-ischemic cardiomyopathy and are highly suspicious for diffuse cardiac sarcoidosis. ECV is very elevated at 45% (amyloid range, however no other findings suggestive of cardiac amyloidosis), however most probably represent diffuse scarring.    07/01/2020: TTE IMPRESSIONS  1. Left ventricular ejection fraction, by estimation, is 20 to 25%. The  left ventricle has  severely decreased function. The left ventricle  demonstrates global hypokinesis. The left ventricular internal cavity size  was moderately dilated. Left  ventricular diastolic parameters were normal.  2. Right ventricular systolic function is moderately reduced. The right  ventricular size is moderately  enlarged. There is moderately elevated  pulmonary artery systolic pressure.  3. Left atrial size was mildly dilated.  4. Degree of functional MR worse compared to echo done July 2019. The  mitral valve is normal in structure. Moderate mitral valve regurgitation.  No evidence of mitral stenosis.  5. The aortic valve is normal in structure. Aortic valve regurgitation is  mild. No aortic stenosis is present.  6. The inferior vena cava is dilated in size with <50% respiratory  variability, suggesting right atrial pressure of 15 mmHg.    Laboratory Data:  High Sensitivity Troponin:  No results for input(s): TROPONINIHS in the last 720 hours.    ChemistryNo results for input(s): NA, K, CL, CO2, GLUCOSE, BUN, CREATININE, CALCIUM, GFRNONAA, GFRAA, ANIONGAP in the last 168 hours.  No results for input(s): PROT, ALBUMIN, AST, ALT, ALKPHOS, BILITOT in the last 168 hours. HematologyNo results for input(s): WBC, RBC, HGB, HCT, MCV, MCH, MCHC, RDW, PLT in the last 168 hours. BNPNo results for input(s): BNP, PROBNP in the last 168 hours.  DDimer No results for input(s): DDIMER in the last 168 hours.   Radiology/Studies:  No results found.   Assessment and Plan:   1. Paroxysmal AFib     CHA2DS2Vasc is 4, on Eliquis appropriately dosed, though not taking it     EKG done yesterday Dr. [name] reviewed     Last SR EKG 11/01/20 QT manually measured 473ms, QTc 468 (RBBB 149ms)     PVCs, NSVT on monitor (Dr. [name] aware)  Will get him going on Eliquis here Suspects he has been back out of rhythm 8-9 days Plan TEE/DCCV Thursday with HF team and start Tikosyn afterwards  Will see where his HR  settles to here on telemetry His baseline conduction system w/trifascicular block will not push rate control med       2. NICM 3. Acute/chronic CHF  4. Cardiacsarcoid     BiVe failure     HF team is seeing him now as well     He feels less SOB today, though still SOB with exertion  D/w HF team Hold entresto until labs, they will  Manage his diuretics and HF meds  4. Cardiac sarcoid (active) 5. PVCs, NSVT noted on out pt holter at baseline     Continue home prednisone   I confirmed with the patient, he tells me that the 3 pills made him too bloated and was told to reduce to one pill only, I have continued 10mg  daily as he was taking and will ask HF team to help with this       6. HTN     Continue home regime  7. CKD      labs ordered     Risk Assessment/Risk Scores:  {   Severity of Illness: The appropriate patient status for this patient is INPATIENT. Inpatient status is judged to be reasonable and necessary in order to provide the required intensity of service to ensure the patient's safety. The patient's presenting symptoms, physical exam findings, and initial radiographic and laboratory data in the context of their chronic comorbidities is felt to place them at high risk for further clinical deterioration. Furthermore, it is not anticipated that the patient will be medically stable for discharge from the hospital within 2 midnights of admission. The following factors support the patient status of inpatient.   " The patient's presenting symptoms include SOB. " The worrisome physical exam findings include AFib RVR, HF.   * I certify  that at the point of admission it is my clinical judgment that the patient will require inpatient hospital care spanning beyond 2 midnights from the point of admission due to high intensity of service, high risk for further deterioration and high frequency of surveillance required.*    For questions or updates, please contact [place] Please consult [email or web address] for contact info under     Signed, Baldwin Jamaica, PA-C  12/03/2020 2:01 PM    I have seen, examined the patient, and reviewed the above assessment and plan.  Changes to above are made where necessary.  On exam, ill appearing, volume overloadd, iRRR.   Patient admitted by Dr [name].  He has not been on [facility] therapy.  Will plan to start eliquis tonight and then perform TEE guided cardioversion on Thursday if schedule allows.  If he converts to sinus in the interim, we could start tikosyn at that point   Co Sign: Thompson Grayer, MD 12/03/2020 4:36 PM

## 2020-12-03 NOTE — Telephone Encounter (Signed)
Spoke with pt who states he has made arrangements for hospital admission today as recommended by Dr [name].  RN spoke with Dawn, [facility] placement nurse and requested bed reservation for Tikosyn direct admit.  Pt advised he will contacted by hospital staff once his bed is ready.  Pt verbalizes understanding and agrees with current plan. Dr [name] notified.

## 2020-12-04 ENCOUNTER — Encounter (HOSPITAL_COMMUNITY): Payer: Self-pay | Admitting: Internal Medicine

## 2020-12-04 ENCOUNTER — Other Ambulatory Visit (HOSPITAL_COMMUNITY): Payer: Self-pay

## 2020-12-04 DIAGNOSIS — I480 Paroxysmal atrial fibrillation: Principal | ICD-10-CM

## 2020-12-04 LAB — BASIC METABOLIC PANEL
Anion gap: 8 (ref 5–15)
Anion gap: 9 (ref 5–15)
BUN: 28 mg/dL — ABNORMAL HIGH (ref 8–23)
BUN: 30 mg/dL — ABNORMAL HIGH (ref 8–23)
CO2: 26 mmol/L (ref 22–32)
CO2: 25 mmol/L (ref 22–32)
Calcium: 9.1 mg/dL (ref 8.9–10.3)
Calcium: 9.2 mg/dL (ref 8.9–10.3)
Chloride: 104 mmol/L (ref 98–111)
Chloride: 104 mmol/L (ref 98–111)
Creatinine, Ser: 1.96 mg/dL — ABNORMAL HIGH (ref 0.61–1.24)
Creatinine, Ser: 1.84 mg/dL — ABNORMAL HIGH (ref 0.61–1.24)
GFR, Estimated: 35 mL/min — ABNORMAL LOW (ref >60)
GFR, Estimated: 38 mL/min — ABNORMAL LOW (ref >60)
Glucose, Bld: 158 mg/dL — ABNORMAL HIGH (ref 70–99)
Glucose, Bld: 154 mg/dL — ABNORMAL HIGH (ref 70–99)
Potassium: 5.1 mmol/L (ref 3.5–5.1)
Potassium: 4 mmol/L (ref 3.5–5.1)
Sodium: 138 mmol/L (ref 135–145)
Sodium: 138 mmol/L (ref 135–145)

## 2020-12-04 LAB — MAGNESIUM
Magnesium: 1.6 mg/dL — ABNORMAL LOW (ref 1.7–2.4)
Magnesium: 2.4 mg/dL (ref 1.7–2.4)

## 2020-12-04 MED ORDER — SODIUM CHLORIDE 0.9% FLUSH
3.0000 mL | Freq: Two times a day (BID) | INTRAVENOUS | Status: DC
  Administered 2020-12-04 – 2020-12-05 (×2): 3 mL via INTRAVENOUS

## 2020-12-04 MED ORDER — SODIUM CHLORIDE 0.9 % IV SOLN: INTRAVENOUS | Status: DC

## 2020-12-04 MED ORDER — SULFAMETHOXAZOLE-TRIMETHOPRIM 800-160 MG PO TABS
1.0000 | ORAL_TABLET | ORAL | Status: DC
  Filled 2020-12-04: qty 1

## 2020-12-04 MED ORDER — DOFETILIDE 250 MCG PO CAPS: 250.0000 ug | ORAL_CAPSULE | Freq: Two times a day (BID) | ORAL | Status: DC

## 2020-12-04 MED ORDER — ASPIRIN 81 MG PO CHEW
81.0000 mg | CHEWABLE_TABLET | ORAL | Status: AC
  Administered 2020-12-05: 81 mg via ORAL
  Filled 2020-12-04: qty 1

## 2020-12-04 MED ORDER — METOPROLOL SUCCINATE ER 25 MG PO TB24
25.0000 mg | ORAL_TABLET | Freq: Two times a day (BID) | ORAL | Status: DC
  Administered 2020-12-04 – 2020-12-07 (×6): 25 mg via ORAL
  Filled 2020-12-04 (×7): qty 1

## 2020-12-04 MED ORDER — FUROSEMIDE 10 MG/ML IJ SOLN
40.0000 mg | Freq: Once | INTRAMUSCULAR | Status: AC
  Administered 2020-12-04: 40 mg via INTRAVENOUS
  Filled 2020-12-04: qty 4

## 2020-12-04 MED ORDER — METOPROLOL SUCCINATE ER 25 MG PO TB24: 25.0000 mg | ORAL_TABLET | Freq: Two times a day (BID) | ORAL | Status: DC

## 2020-12-04 MED ORDER — MAGNESIUM SULFATE IN D5W 1-5 GM/100ML-% IV SOLN
1.0000 g | Freq: Once | INTRAVENOUS | Status: AC
  Administered 2020-12-04: 1 g via INTRAVENOUS
  Filled 2020-12-04 (×2): qty 100

## 2020-12-04 MED ORDER — OFF THE BEAT BOOK
Freq: Once | Status: AC
  Filled 2020-12-04: qty 1

## 2020-12-04 MED ORDER — METHOTREXATE 2.5 MG PO TABS
10.0000 mg | ORAL_TABLET | ORAL | Status: DC
  Administered 2020-12-04: 10 mg via ORAL
  Filled 2020-12-04: qty 4

## 2020-12-04 MED ORDER — FUROSEMIDE 10 MG/ML IJ SOLN
80.0000 mg | Freq: Two times a day (BID) | INTRAMUSCULAR | Status: DC
  Administered 2020-12-04 – 2020-12-05 (×2): 80 mg via INTRAVENOUS
  Filled 2020-12-04 (×2): qty 8

## 2020-12-04 MED ORDER — SODIUM CHLORIDE 0.9% FLUSH: 3.0000 mL | INTRAVENOUS | Status: DC | PRN

## 2020-12-04 MED ORDER — SODIUM CHLORIDE 0.9 % IV SOLN: 250.0000 mL | INTRAVENOUS | Status: DC | PRN

## 2020-12-04 MED ORDER — DOFETILIDE 250 MCG PO CAPS
250.0000 ug | ORAL_CAPSULE | Freq: Two times a day (BID) | ORAL | Status: DC
  Administered 2020-12-04 – 2020-12-05 (×3): 250 ug via ORAL
  Filled 2020-12-04 (×3): qty 1

## 2020-12-04 MED ORDER — SODIUM CHLORIDE 0.9% FLUSH: 3.0000 mL | Freq: Two times a day (BID) | INTRAVENOUS | Status: DC

## 2020-12-04 MED ORDER — MAGNESIUM SULFATE 2 GM/50ML IV SOLN: 2.0000 g | Freq: Once | INTRAVENOUS | Status: DC

## 2020-12-04 MED ORDER — FOLIC ACID 1 MG PO TABS
1.0000 mg | ORAL_TABLET | Freq: Every day | ORAL | Status: DC
  Administered 2020-12-04 – 2020-12-07 (×4): 1 mg via ORAL
  Filled 2020-12-04 (×4): qty 1

## 2020-12-04 MED ORDER — MAGNESIUM SULFATE 2 GM/50ML IV SOLN
2.0000 g | Freq: Once | INTRAVENOUS | Status: AC
  Administered 2020-12-04: 2 g via INTRAVENOUS
  Filled 2020-12-04: qty 50

## 2020-12-04 NOTE — Progress Notes (Addendum)
Advanced Heart Failure Rounding Note  PCP-Cardiologist: No primary care provider on file.   Subjective:   Admitted with A fib RVR and volume overload.    Back in NSR this morning. He has been diuresing with IV lasix.   Feeling a little better today.   Objective:   Weight Range: 69.6 kg Body mass index is 21.7 kg/m.   Vital Signs:   Temp:  [97.5 F (36.4 C)-98.1 F (36.7 C)] 97.9 F (36.6 C) (04/13 0807) Pulse Rate:  [74-124] 76 (04/13 0807) Resp:  [18-20] 18 (04/13 0807) BP: (92-128)/(67-116) 97/67 (04/13 0807) SpO2:  [96 %-100 %] 96 % (04/13 0807) Weight:  [69.4 kg-69.6 kg] 69.6 kg (04/13 0300) Last BM Date: 12/03/20  Weight change: Filed Weights   12/03/20 1336 12/04/20 0300  Weight: 69.4 kg 69.6 kg    Intake/Output:   Intake/Output Summary (Last 24 hours) at 12/04/2020 0935 Last data filed at 12/04/2020 0700 Gross per 24 hour  Intake 720 ml  Output 950 ml  Net -230 ml      Physical Exam    General:   No resp difficulty HEENT: Normal Neck: Supple. JVP to jaw  . Carotids 2+ bilat; no bruits. No lymphadenopathy or thyromegaly appreciated. Cor: PMI nondisplaced. Regular rate & rhythm. No rubs, gallops or murmurs. Lungs: Clear Abdomen: Soft, nontender, nondistended. No hepatosplenomegaly. No bruits or masses. Good bowel sounds. Extremities: No cyanosis, clubbing, rash, edema Neuro: Alert & orientedx3, cranial nerves grossly intact. moves all 4 extremities w/o difficulty. Affect pleasant   Telemetry   SR with NSVT/PVCs   EKG      Labs    CBC Recent Labs    12/03/20 1444  WBC 8.3  NEUTROABS 5.1  HGB 13.3  HCT 40.2  MCV 104.7*  PLT 99*   Basic Metabolic Panel Recent Labs    12/03/20 1444 12/04/20 0210  NA 143 138  K 3.8 5.1  CL 108 104  CO2 29 26  GLUCOSE 105* 158*  BUN 29* 28*  CREATININE 1.94* 1.96*  CALCIUM 8.9 9.1  MG 1.8  --    Liver Function Tests Recent Labs    12/03/20 1444  AST 29  ALT 41  ALKPHOS 71  BILITOT  1.5*  PROT 6.3*  ALBUMIN 3.2*   No results for input(s): LIPASE, AMYLASE in the last 72 hours. Cardiac Enzymes No results for input(s): CKTOTAL, CKMB, CKMBINDEX, TROPONINI in the last 72 hours.  BNP: BNP (last 3 results) Recent Labs    06/30/20 2354 08/06/20 0941 12/03/20 1444  BNP 3,205.6* >4,500.0* >4,500.0*    ProBNP (last 3 results) No results for input(s): PROBNP in the last 8760 hours.   D-Dimer No results for input(s): DDIMER in the last 72 hours. Hemoglobin A1C No results for input(s): HGBA1C in the last 72 hours. Fasting Lipid Panel No results for input(s): CHOL, HDL, LDLCALC, TRIG, CHOLHDL, LDLDIRECT in the last 72 hours. Thyroid Function Tests No results for input(s): TSH, T4TOTAL, T3FREE, THYROIDAB in the last 72 hours.  Invalid input(s): FREET3  Other results:   Imaging    DG Chest Port 1V same Day  Result Date: 12/03/2020 CLINICAL DATA:  Chronic systolic heart failure EXAM: PORTABLE CHEST 1 VIEW COMPARISON:  August 06, 2020 FINDINGS: There is cardiomegaly with pulmonary vascularity normal. No adenopathy. No edema or airspace opacity. There is aortic atherosclerosis. There is an exostosis arising from the inferior aspect of the mid clavicle on the left. IMPRESSION: Cardiomegaly. No edema or airspace opacity.  Pulmonary vascularity within normal limits. Aortic Atherosclerosis (ICD10-I70.0). Electronically Signed   By: Lowella Grip III M.D.   On: 12/03/2020 16:22      Medications:     Scheduled Medications: . apixaban  5 mg Oral BID  . atorvastatin  40 mg Oral q1800  . cholecalciferol  1,000 Units Oral BID  . dapagliflozin propanediol  10 mg Oral Daily  . dofetilide  250 mcg Oral BID  . furosemide  40 mg Intravenous BID  . isosorbide mononitrate  15 mg Oral Daily  . mouth rinse  15 mL Mouth Rinse BID  . metoprolol succinate  25 mg Oral BID  . predniSONE  30 mg Oral Q breakfast  . sodium chloride flush  3 mL Intravenous Q12H  . sodium  chloride flush  3 mL Intravenous Q12H     Infusions: . sodium chloride    . sodium chloride    . magnesium sulfate bolus IVPB       PRN Medications:  sodium chloride, sodium chloride, acetaminophen, sodium chloride flush, sodium chloride flush     Assessment/Plan   1. A fib RVR -Saw EP yesterday and was back in A fib RVR. Set up for direct admit and Tikosyn.  - He has not started eliquis. We will start eliquis 5 mg twice a day - In SR today. Starting tikosyn load today Plan for TEE/DC-CV after 3 dose. ?Thurday morning.  - Continue Toprol XL at current dose. .   2. Cardiac Sarcoid - Followed by Dr Marcene Brawn at [facility].  -07/2020 CMRI suggestive sarcoid--> Cardiac Pet--> Cardiac Sarcoid -Referred to Dr Marcene Brawn at [facility] and was evaluated 3/2 with plans to continue prednisone and repeat Pet after 3 months. At that point consider methotrexate.  - Supposed to be on prednisione 30 mg daily but has only been on 10 mg daily.  - Started prednisone 30 mg daily - Start bactrim 1 tab M-W-F   3. Acute/ Chronic Combined HFrEF indicate nonobstructive CAD on cath in 2007, study report not on file) - Most recent echo 11/21 LVEF 20-25%, G2DD, diffuse HK, moderately reduced RV -cMRI suggestive of sarcoid. EF 24% - Cardiac Pet as noted above with cardiac sarcoid - Presenting with NYHA IV symptoms. Improved today.  - Volume status remains elevated. Continue IV lasix but will increase to 80  IV twice a day  - Continue Toprol XL as noted above - Hold entresto/spiro for now with elevated creatinine. RHC in am    4. CKD Stage IIIa Creatinine  Baseline 08-27-13. - Creatinine trending 1.8>2  5. PVCs/NSVT  Eventually will need ICD  6. PAD  -S/P Bifem BPG and aortoiliac aneurysm repair - Continue statin.   RHC in am .    Length of Stay: 1  Amy Clegg, NP  12/04/2020, 9:35 AM  Advanced Heart Failure Team Pager [phone] (M-F; 7a - 5p)  Please contact [facility] for night-coverage after  hours (5p -7a ) and weekends on [email or web address]  Patient seen and examined with the above-signed Advanced Practice Provider and/or Housestaff. I personally reviewed laboratory data, imaging studies and relevant notes. I independently examined the patient and formulated the important aspects of the plan. I have edited the note to reflect any of my changes or salient points. I have personally discussed the plan with the patient and/or family.  Converted spontaneously. Now in NSR. Feels better but creatinine still up. Having some runs of NSVT  General:  Lying in bed No resp difficulty HEENT: normal Neck: supple.  JVP up Carotids 2+ bilat; no bruits. No lymphadenopathy or thryomegaly appreciated. Cor: PMI nondisplaced. Regular rate & rhythm. No rubs, gallops or murmurs. Lungs: clear Abdomen: soft, nontender, + distended. No hepatosplenomegaly. No bruits or masses. Good bowel sounds. Extremities: no cyanosis, clubbing, rash, edema mildly cool Neuro: alert & orientedx3, cranial nerves grossly intact. moves all 4 extremities w/o difficulty. Affect pleasant  He is back in NSR but still tenuous and I worry about low output. D/w EP. Plan to start Tikosyn today. Continue IV diuresis. Plan RHC tomorrow. Possible ICD on Friday. Watch electrolytes closely.   Glori Bickers, MD  2:21 PM

## 2020-12-04 NOTE — Progress Notes (Addendum)
Progress Note  Patient Name: [name] Date of Encounter: 12/04/2020  [facility] Cardiologist: Dr. Percival Spanish  Subjective   A little less SOB, no CP  Inpatient Medications    Scheduled Meds: . apixaban  5 mg Oral BID  . atorvastatin  40 mg Oral q1800  . cholecalciferol  1,000 Units Oral BID  . dapagliflozin propanediol  10 mg Oral Daily  . dofetilide  250 mcg Oral BID  . furosemide  40 mg Intravenous BID  . isosorbide mononitrate  15 mg Oral Daily  . mouth rinse  15 mL Mouth Rinse BID  . metoprolol succinate  25 mg Oral BID  . predniSONE  30 mg Oral Q breakfast  . sodium chloride flush  3 mL Intravenous Q12H  . sodium chloride flush  3 mL Intravenous Q12H   Continuous Infusions: . sodium chloride    . sodium chloride     PRN Meds: sodium chloride, sodium chloride, acetaminophen, sodium chloride flush, sodium chloride flush   Vital Signs    Vitals:   12/03/20 2031 12/03/20 2337 12/04/20 0300 12/04/20 0807  BP: 105/73 92/77 103/74 97/67  Pulse: 79 74 79 76  Resp: 18 18 18 18   Temp: 97.9 F (36.6 C) 97.8 F (36.6 C) 97.6 F (36.4 C) 97.9 F (36.6 C)  TempSrc: Oral Oral Oral Oral  SpO2: 100% 100% 100% 96%  Weight:   69.6 kg   Height:        Intake/Output Summary (Last 24 hours) at 12/04/2020 0914 Last data filed at 12/04/2020 0700 Gross per 24 hour  Intake 720 ml  Output 950 ml  Net -230 ml   Last 3 Weights 12/04/2020 12/03/2020 12/02/2020  Weight (lbs) 153 lb 6.4 oz 152 lb 14.4 oz 156 lb  Weight (kg) 69.582 kg 69.355 kg 70.761 kg      Telemetry    SR, 80's 1st degree AVblock, occ PVCs, NSVT, 4-5 beats, PAT - Personally Reviewed  ECG    SR 72bpm, RBBB, 1st degree AVBlock, LAD - Personally Reviewed  Physical Exam   Patient examined by Dr. [name] GEN: No acute distress.   Neck: +10 JVD Cardiac: RRR, no murmurs, rubs, or gallops.  Respiratory: cackles L base. GI: Soft, nontender, non-distended  MS: No edema; No deformity. Neuro:  Nonfocal   Psych: Normal affect   Labs    High Sensitivity Troponin:  No results for input(s): TROPONINIHS in the last 720 hours.    Chemistry Recent Labs  Lab 12/03/20 1444 12/04/20 0210  NA 143 138  K 3.8 5.1  CL 108 104  CO2 29 26  GLUCOSE 105* 158*  BUN 29* 28*  CREATININE 1.94* 1.96*  CALCIUM 8.9 9.1  PROT 6.3*  --   ALBUMIN 3.2*  --   AST 29  --   ALT 41  --   ALKPHOS 71  --   BILITOT 1.5*  --   GFRNONAA 36* 35*  ANIONGAP 6 8     Hematology Recent Labs  Lab 12/03/20 1444  WBC 8.3  RBC 3.84*  HGB 13.3  HCT 40.2  MCV 104.7*  MCH 34.6*  MCHC 33.1  RDW 18.2*  PLT 99*    BNP Recent Labs  Lab 12/03/20 1444  BNP >4,500.0*     DDimer No results for input(s): DDIMER in the last 168 hours.   Radiology    DG Chest Port 1V same Day  Result Date: 12/03/2020 CLINICAL DATA:  Chronic systolic heart failure EXAM: PORTABLE CHEST  1 VIEW COMPARISON:  August 06, 2020 FINDINGS: There is cardiomegaly with pulmonary vascularity normal. No adenopathy. No edema or airspace opacity. There is aortic atherosclerosis. There is an exostosis arising from the inferior aspect of the mid clavicle on the left. IMPRESSION: Cardiomegaly. No edema or airspace opacity. Pulmonary vascularity within normal limits. Aortic Atherosclerosis (ICD10-I70.0). Electronically Signed   By: Lowella Grip III M.D.   On: 12/03/2020 16:22    Cardiac Studies   March 2022, 2 week monitor 1. Sinus Rhythm. First Degree AV Block was present - avg HR of 80 bpm. 2. Bundle Branch Block/IVCD was present.  3. 43 runs of nonsustained ventricular tachycardia occurred, the run with the  fastest interval lasting 6 beats with a max rate of 214 bpm, the longest lasting 14 beats with an avg rate of 162 bpm.  4. Atrial Fibrillation occurred (7% burden), ranging from 75-186 bpm (avg of 135 bpm), the longest lasting 11 hours 8 mins with an avg rate of 139 bpm.  5. Rare PACs 6. Occasional PVCs (3.4%,  47829)    10/21/20; PET/CT Impression:  1. Findings are suggestive of inflammatory process involving predominantly right atrium, 5/17 segments of the left ventricle and  to a lesser degree left atrium.  2. Coronary artery calcifications and aortic calcifications.  3. No evidence of metabolically active sarcoid outside of the heart.    RHC/LHC 07/2020 -moderate non-obstructive CAD Echo 11/21 LVEF 20-25%, G2DD, diffuse HK, moderately reduced RV CMRI 07/2020 - LVEF 24%These findings are consistent with non-ischemic cardiomyopathy and are highly suspicious for diffuse cardiac sarcoidosis. ECV is very elevated at 45% (amyloid range, however no other findings suggestive of cardiac amyloidosis), however most probably represent diffuse scarring.    07/01/2020: TTE IMPRESSIONS  1. Left ventricular ejection fraction, by estimation, is 20 to 25%. The  left ventricle has severely decreased function. The left ventricle  demonstrates global hypokinesis. The left ventricular internal cavity size  was moderately dilated. Left  ventricular diastolic parameters were normal.  2. Right ventricular systolic function is moderately reduced. The right  ventricular size is moderately enlarged. There is moderately elevated  pulmonary artery systolic pressure.  3. Left atrial size was mildly dilated.  4. Degree of functional MR worse compared to echo done July 2019. The  mitral valve is normal in structure. Moderate mitral valve regurgitation.  No evidence of mitral stenosis.  5. The aortic valve is normal in structure. Aortic valve regurgitation is  mild. No aortic stenosis is present.  6. The inferior vena cava is dilated in size with <50% respiratory  variability, suggesting right atrial pressure of 15 mmHg.   Patient Profile     75 y.o. male with PMHx of chronic CHF (combined), NICM, also mod reduced RVEF, HTN, CKD (III), former smoker (chest CT 11/21 showing bilateral pulmonary  nodules and mild mediastinal and hilar adenopathy), PAD )s/p bifem bypass and aorto-iliac aneursym repair), AFib, PVCs, NSVT, RBBB (trifascicular block), cardiac sarcoid admitted with AFib RVR and acute/chronic CHF exacerbation  Assessment & Plan    1. Paroxysmal AFib     CHA2DS2Vasc is 4, on Eliquis appropriately dosed, though was not taking it     Eliquis started yesterday here  He had spontaneous conversion last night to SR Dr. [name] has seen and examined the patient this morning We have reviewed labs and EKG together K+ 5.1 Mag yesterday 1.8 > 2gm ordered, OK to start Tikosyn without a recheck Creat 1.96 (cal creatCl is 33 (his best creat looks  about 1.47- 1.7 of late with calc cl 40's)        Start 279mcg dose QTc acceptable given RBBB  Discussed with pharmacy, dose to start this AM        2. NICM 3. Acute/chronic CHF  4. Cardiacsarcoid     BiVe failure     HF team is on board     Will defer to them volume management/diuresis   4. Cardiac sarcoid (active) 5. PVCs, NSVT noted on out pt holter at baseline     appreciate HF team contnued prednisone at the 30mg  dosing  ? Bactrim ? methotrexate      6. HTN     relative hypotension with meds, looks ok  7. CKD      follow  For questions or updates, please contact [place] Please consult [email or web address] for contact info under        Signed, Baldwin Jamaica, PA-C  12/04/2020, 9:14 AM   As above.  We will reach out to partners about possibly proceeding with dual-chamber ICD implantation all hospital.  Continue diuresis.  Holding on afterload reduction given his low blood pressure.  Will defer to heart failure  We will review with heart failure team plan for management of cardiac sarcoid.  I will send them the [facility] protocol which I recently obtained insurance to proceed with methotrexate antibiotics

## 2020-12-04 NOTE — Care Management (Signed)
12-04-20 1611 Benefits check submitted for Tikosyn. Case Manager will follow for cost and pharmacy of choice. Bethena Roys, RN,BSN Case Manager

## 2020-12-04 NOTE — Progress Notes (Signed)
Notified by CCMD of 20 beats of NSVT.  Patient asymptomatic.  VSS.  Dr. Vickki Muff notified.  Will continue to monitor.  [name]

## 2020-12-04 NOTE — Discharge Instructions (Addendum)
Supplemental Discharge Instructions for  Pacemaker/Defibrillator Patients   Activity No heavy lifting or vigorous activity with your left/right arm for 6 to 8 weeks.  Do not raise your left/right arm above your head for one week.  Gradually raise your affected arm as drawn below.             12/14/20                     12/15/20                     12/16/20                 12/17/20 __  NO DRIVING until cleared to at your wound check visit .  WOUND CARE - Keep the wound area clean and dry.  Do not get this area wet , no showers until cleared to at your wound check visit - The tape/steri-strips on your wound will fall off; do not pull them off.  No bandage is needed on the site.  DO  NOT apply any creams, oils, or ointments to the wound area. - If you notice any drainage or discharge from the wound, any swelling or bruising at the site, or you develop a fever > 101? F after you are discharged home, call the office at once.  Special Instructions - You are still able to use cellular telephones; use the ear opposite the side where you have your pacemaker/defibrillator.  Avoid carrying your cellular phone near your device. - When traveling through airports, show security personnel your identification card to avoid being screened in the metal detectors.  Ask the security personnel to use the hand wand. - Avoid arc welding equipment, MRI testing (magnetic resonance imaging), TENS units (transcutaneous nerve stimulators).  Call the office for questions about other devices. - Avoid electrical appliances that are in poor condition or are not properly grounded. - Microwave ovens are safe to be near or to operate.  Additional information for defibrillator patients should your device go off: - If your device goes off ONCE and you feel fine afterward, notify the device clinic nurses. - If your device goes off ONCE and you do not feel well afterward, call 911. - If your device goes off TWICE, call  911. - If your device goes off THREE times in one day, call 911.  DO NOT DRIVE YOURSELF OR A FAMILY MEMBER WITH A DEFIBRILLATOR TO THE HOSPITAL--CALL 911.      Information on my medicine - ELIQUIS (apixaban)  Why was Eliquis prescribed for you? Eliquis was prescribed for you to reduce the risk of a blood clot forming that can cause a stroke if you have a medical condition called atrial fibrillation (a type of irregular heartbeat).  What do You need to know about Eliquis ? Take your Eliquis TWICE DAILY - one tablet in the morning and one tablet in the evening with or without food. If you have difficulty swallowing the tablet whole please discuss with your pharmacist how to take the medication safely.  Take Eliquis exactly as prescribed by your doctor and DO NOT stop taking Eliquis without talking to the doctor who prescribed the medication.  Stopping may increase your risk of developing a stroke.  Refill your prescription before you run out.  After discharge, you should have regular check-up appointments with your healthcare provider that is prescribing your Eliquis.  In the future your dose may need to be  changed if your kidney function or weight changes by a significant amount or as you get older.  What do you do if you miss a dose? If you miss a dose, take it as soon as you remember on the same day and resume taking twice daily.  Do not take more than one dose of ELIQUIS at the same time to make up a missed dose.  Important Safety Information A possible side effect of Eliquis is bleeding. You should call your healthcare provider right away if you experience any of the following: ? Bleeding from an injury or your nose that does not stop. ? Unusual colored urine (red or dark brown) or unusual colored stools (red or black). ? Unusual bruising for unknown reasons. ? A serious fall or if you hit your head (even if there is no bleeding).  Some medicines may interact with Eliquis  and might increase your risk of bleeding or clotting while on Eliquis. To help avoid this, consult your healthcare provider or pharmacist prior to using any new prescription or non-prescription medications, including herbals, vitamins, non-steroidal anti-inflammatory drugs (NSAIDs) and supplements.  This website has more information on Eliquis (apixaban): http://www.eliquis.com/eliquis/home   Heart-Healthy Eating Plan Heart-healthy meal planning includes:  Eating less unhealthy fats.  Eating more healthy fats.  Making other changes in your diet. Talk with your doctor or a diet specialist (dietitian) to create an eating plan that is right for you. What is my plan? Your doctor may recommend an eating plan that includes:  Total fat: ______% or less of total calories a day.  Saturated fat: ______% or less of total calories a day.  Cholesterol: less than _________mg a day. What are tips for following this plan? Cooking Avoid frying your food. Try to bake, boil, grill, or broil it instead. You can also reduce fat by:  Removing the skin from poultry.  Removing all visible fats from meats.  Steaming vegetables in water or broth. Meal planning  At meals, divide your plate into four equal parts: ? Fill one-half of your plate with vegetables and green salads. ? Fill one-fourth of your plate with whole grains. ? Fill one-fourth of your plate with lean protein foods.  Eat 4-5 servings of vegetables per day. A serving of vegetables is: ? 1 cup of raw or cooked vegetables. ? 2 cups of raw leafy greens.  Eat 4-5 servings of fruit per day. A serving of fruit is: ? 1 medium whole fruit. ?  cup of dried fruit. ?  cup of fresh, frozen, or canned fruit. ?  cup of 100% fruit juice.  Eat more foods that have soluble fiber. These are apples, broccoli, carrots, beans, peas, and barley. Try to get 20-30 g of fiber per day.  Eat 4-5 servings of nuts, legumes, and seeds per week: ? 1  serving of dried beans or legumes equals  cup after being cooked. ? 1 serving of nuts is  cup. ? 1 serving of seeds equals 1 tablespoon.   General information  Eat more home-cooked food. Eat less restaurant, buffet, and fast food.  Limit or avoid alcohol.  Limit foods that are high in starch and sugar.  Avoid fried foods.  Lose weight if you are overweight.  Keep track of how much salt (sodium) you eat. This is important if you have high blood pressure. Ask your doctor to tell you more about this.  Try to add vegetarian meals each week. Fats  Choose healthy fats. These include olive  oil and canola oil, flaxseeds, walnuts, almonds, and seeds.  Eat more omega-3 fats. These include salmon, mackerel, sardines, tuna, flaxseed oil, and ground flaxseeds. Try to eat fish at least 2 times each week.  Check food labels. Avoid foods with trans fats or high amounts of saturated fat.  Limit saturated fats. ? These are often found in animal products, such as meats, butter, and cream. ? These are also found in plant foods, such as palm oil, palm kernel oil, and coconut oil.  Avoid foods with partially hydrogenated oils in them. These have trans fats. Examples are stick margarine, some tub margarines, cookies, crackers, and other baked goods. What foods can I eat? Fruits All fresh, canned (in natural juice), or frozen fruits. Vegetables Fresh or frozen vegetables (raw, steamed, roasted, or grilled). Green salads. Grains Most grains. Choose whole wheat and whole grains most of the time. Rice and pasta, including brown rice and pastas made with whole wheat. Meats and other proteins Lean, well-trimmed beef, veal, pork, and lamb. Chicken and [place] without skin. All fish and shellfish. Wild duck, rabbit, pheasant, and venison. Egg whites or low-cholesterol egg substitutes. Dried beans, peas, lentils, and tofu. Seeds and most nuts. Dairy Low-fat or nonfat cheeses, including ricotta and  mozzarella. Skim or 1% milk that is liquid, powdered, or evaporated. Buttermilk that is made with low-fat milk. Nonfat or low-fat yogurt. Fats and oils Non-hydrogenated (trans-free) margarines. Vegetable oils, including soybean, sesame, sunflower, olive, peanut, safflower, corn, canola, and cottonseed. Salad dressings or mayonnaise made with a vegetable oil. Beverages Mineral water. Coffee and tea. Diet carbonated beverages. Sweets and desserts Sherbet, gelatin, and fruit ice. Small amounts of dark chocolate. Limit all sweets and desserts. Seasonings and condiments All seasonings and condiments. The items listed above may not be a complete list of foods and drinks you can eat. Contact a dietitian for more options. What foods should I avoid? Fruits Canned fruit in heavy syrup. Fruit in cream or butter sauce. Fried fruit. Limit coconut. Vegetables Vegetables cooked in cheese, cream, or butter sauce. Fried vegetables. Grains Breads that are made with saturated or trans fats, oils, or whole milk. Croissants. Sweet rolls. Donuts. High-fat crackers, such as cheese crackers. Meats and other proteins Fatty meats, such as hot dogs, ribs, sausage, bacon, rib-eye roast or steak. High-fat deli meats, such as salami and bologna. Caviar. Domestic duck and goose. Organ meats, such as liver. Dairy Cream, sour cream, cream cheese, and creamed cottage cheese. Whole-milk cheeses. Whole or 2% milk that is liquid, evaporated, or condensed. Whole buttermilk. Cream sauce or high-fat cheese sauce. Yogurt that is made from whole milk. Fats and oils Meat fat, or shortening. Cocoa butter, hydrogenated oils, palm oil, coconut oil, palm kernel oil. Solid fats and shortenings, including bacon fat, salt pork, lard, and butter. Nondairy cream substitutes. Salad dressings with cheese or sour cream. Beverages Regular sodas and juice drinks with added sugar. Sweets and desserts Frosting. Pudding. Cookies. Cakes. Pies. Milk  chocolate or white chocolate. Buttered syrups. Full-fat ice cream or ice cream drinks. The items listed above may not be a complete list of foods and drinks to avoid. Contact a dietitian for more information. Summary  Heart-healthy meal planning includes eating less unhealthy fats, eating more healthy fats, and making other changes in your diet.  Eat a balanced diet. This includes fruits and vegetables, low-fat or nonfat dairy, lean protein, nuts and legumes, whole grains, and heart-healthy oils and fats. This information is not intended to replace advice given  to you by your health care provider. Make sure you discuss any questions you have with your health care provider. Document Revised: 10/14/2017 Document Reviewed: 09/17/2017 Elsevier Patient Education  2021 [employer].

## 2020-12-04 NOTE — Progress Notes (Signed)
Follow up mag level not drawn. In review, Looks mag of 1.6 was drawn at/about the time the 2gm mag dose was finished, he has gotten another gram since then. Post dose EKG is reviewed with stable QTc Telemetry was reviewed with noted/known PVCs, PATs, infrequent NSVT.  Follow up mag level in the morning   OK for tonight's dose of Tikosyn  Tommye Standard, PA-C

## 2020-12-04 NOTE — Progress Notes (Signed)
Post dose ekg reviewed SR 68bpm, QRS 110ms, QT 480/QTc in light of RBBB remains acceptable  Planned for RHC tomorrow Will hold Eliquis for cath tomorrow and ICD implant Friday  Tommye Standard, PA-C

## 2020-12-04 NOTE — Progress Notes (Signed)
Pharmacy: Dofetilide (Tikosyn) - Initial Consult Assessment and Electrolyte Replacement  Pharmacy consulted to assist in monitoring and replacing electrolytes in this 75 y.o. male admitted on 12/03/2020 undergoing dofetilide initiation  Assessment:  Patient Exclusion Criteria: If any screening criteria checked as "Yes", then  patient  should NOT receive dofetilide until criteria item is corrected.  If "Yes" please indicate correction plan.  YES  NO Patient  Exclusion Criteria Correction Plan   []   [x]   Baseline QTc interval is greater than or equal to 440 msec. IF above YES box checked dofetilide contraindicated unless patient has ICD; then may proceed if QTc 500-550 msec or with known ventricular conduction abnormalities may proceed with QTc 550-600 msec. QTc = 407    []   [x]   Patient is known or suspected to have a digoxin level greater than 2 ng/ml: No results found for: DIGOXIN     []   [x]   Creatinine clearance less than 20 ml/min (calculated using Cockcroft-Gault, actual body weight and serum creatinine): Estimated Creatinine Clearance: 32.6 mL/min (A) (by C-G formula based on SCr of 1.96 mg/dL (H)).     []   [x]  Patient has received drugs known to prolong the QT intervals within the last 48 hours (phenothiazines, tricyclics or tetracyclic antidepressants, erythromycin, H-1 antihistamines, cisapride, fluoroquinolones, azithromycin, ondansetron).   Updated information on QT prolonging agents is available to be searched on the following database:QT prolonging agents     []   [x]   Patient received a dose of hydrochlorothiazide (Oretic) alone or in any combination including triamterene (Dyazide, Maxzide) in the last 48 hours.    []   [x]  Patient received a medication known to increase dofetilide plasma concentrations prior to initial dofetilide dose:  . Trimethoprim (Primsol, Proloprim) in the last 36 hours . Verapamil (Calan, Verelan) in the last 36 hours or a sustained  release dose in the last 72 hours . Megestrol (Megace) in the last 5 days  . Cimetidine (Tagamet) in the last 6 hours . Ketoconazole (Nizoral) in the last 24 hours . Itraconazole (Sporanox) in the last 48 hours  . Prochlorperazine (Compazine) in the last 36 hours     []   [x]   Patient is known to have a history of torsades de pointes; congenital or acquired long QT syndromes.    []   [x]   Patient has received a Class 1 antiarrhythmic with less than 2 half-lives since last dose. (Disopyramide, Quinidine, Procainamide, Lidocaine, Mexiletine, Flecainide, Propafenone)    []   [x]   Patient has received amiodarone therapy in the past 3 months or amiodarone level is greater than 0.3 ng/ml.    Patient has been appropriately anticoagulated with apixaban.  Labs:    Component Value Date/Time   K 5.1 12/04/2020 0210   MG 1.8 12/03/2020 1444     Plan: Potassium: K >/= 4: Appropriate to initiate Tikosyn, no replacement needed    Magnesium: Mg 1.8-2: Give Mg 2 gm IV x1 to prevent Mg from dropping below 1.8 - do not need to recheck Mg. Appropriate to initiate Tikosyn   Thank you for allowing pharmacy to participate in this patient's care   Hildred Laser, PharmD Clinical Pharmacist **Pharmacist phone directory can now be found on [email or web address] ([email or web address]).  Listed under [place].

## 2020-12-04 NOTE — Plan of Care (Signed)

## 2020-12-04 NOTE — Progress Notes (Signed)
Pharmacy: Dofetilide (Tikosyn) - Follow Up Assessment and Electrolyte Replacement  Pharmacy consulted to assist in monitoring and replacing electrolytes in this 75 y.o. male admitted on 12/03/2020 undergoing dofetilide initiation. First dofetilide dose: 266mcg PO BID, started on 4/13 AM.  Labs:    Component Value Date/Time   K 4.0 12/04/2020 1104   MG 2.4 12/04/2020 1957     Plan: Potassium: K >/= 4: No additional supplementation needed  Magnesium: Mg > 2: No additional supplementation needed   Adelise Buswell D. Mina Marble, PharmD, BCPS, [place] 12/04/2020, 8:50 PM

## 2020-12-04 NOTE — TOC Initial Note (Addendum)
Transition of Care (TOC) - Initial/Assessment Note  Heart Failure   Patient Details  Name: [name] MRN: [id number] Date of Birth: 1946/01/02  Transition of Care [facility]) CM/SW Contact:    [place], [place] Phone Number: 12/04/2020, 4:34 PM  Clinical Narrative:               CSW spoke with the patient at bedside and completed very brief SDOH screening with the patient who reported that he is still driving, he doesn't have a primary care doctor and would like help setting that appointment up. CSW obtained the patients signature for the Food Stamp referral for the [facility] to reach out to them as patient reported any help would be beneficial. CSW provided the patient with the social workers name, number and position and if identify's other needs to please reach out so that CSW can provide support.  CSW will continue to follow for d/c needs.    Barriers to Discharge: Continued Medical Work up   Patient Goals and CMS Choice Patient states their goals for this hospitalization and ongoing recovery are:: to return home      Expected Discharge Plan and Services   In-house Referral: Clinical Social Work Discharge Planning Services: CM Consult   Living arrangements for the past 2 months: Single Family Home                                      Prior Living Arrangements/Services Living arrangements for the past 2 months: Single Family Home Lives with:: Self Patient language and need for interpreter reviewed:: Yes        Need for Family Participation in Patient Care: No (Comment) Care giver support system in place?: No (comment)   Criminal Activity/Legal Involvement Pertinent to Current Situation/Hospitalization: No - Comment as needed  Activities of Daily Living Home Assistive Devices/Equipment: None ADL Screening (condition at time of admission) Patient's cognitive ability adequate to safely complete daily activities?: Yes Is the patient deaf or have  difficulty hearing?: No Does the patient have difficulty seeing, even when wearing glasses/contacts?: No Does the patient have difficulty concentrating, remembering, or making decisions?: No Patient able to express need for assistance with ADLs?: Yes Does the patient have difficulty dressing or bathing?: No Independently performs ADLs?: Yes (appropriate for developmental age) Does the patient have difficulty walking or climbing stairs?: No Weakness of Legs: None Weakness of Arms/Hands: None  Permission Sought/Granted Permission sought to share information with : Case Manager                Emotional Assessment Appearance:: Appears stated age Attitude/Demeanor/Rapport: Engaged Affect (typically observed): Pleasant Orientation: : Oriented to Self,Oriented to Place,Oriented to  Time,Oriented to Situation Alcohol / Substance Use: Not Applicable Psych Involvement: No (comment)  Admission diagnosis:  Atrial fibrillation ([place]) [I48.91] Patient Active Problem List   Diagnosis Date Noted  . Bullous keratopathy 11/28/2020  . Type 2 diabetes mellitus ([place]) 11/28/2020  . Diabetes mellitus without complication ([place]) 92/06/9416  . Primary open-angle glaucoma, right eye, severe stage 11/28/2020  . Myopia, bilateral 11/28/2020  . Presbyopia 11/28/2020  . Atrial fibrillation ([place]) 11/28/2020  . Encounter for preprocedural laboratory examination 11/28/2020  . Encounter for immunization 11/28/2020  . Dietary counseling and surveillance 11/28/2020  . Other specified counseling 11/28/2020  . Other specified health status 11/28/2020  . Erectile dysfunction 11/28/2020  . Encounter for therapeutic drug level monitoring 11/28/2020  .  Hypertrophy of prostate without urinary obstruction and other lower urinary tract symptoms (LUTS) 11/28/2020  . 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 Closed fracture of ankle 11/28/2020  . Corneal ulcer 11/28/2020  . Cystoid macular edema 11/28/2020  . Depression  11/28/2020  . Lens replaced by other means 11/28/2020  . Other and unspecified hyperlipidemia 11/28/2020  . Pain in joint 11/28/2020  . Purulent endophthalmitis 11/28/2020  . Syncope and collapse 11/28/2020  . Vitamin D deficiency 11/28/2020  . Other specified glaucoma 10/23/2020  . Other age-related cataract 10/23/2020  . Former smoker 10/22/2020  . Atypical chest pain 08/07/2020  . Elevated troponin 08/07/2020  . Acute on chronic HFrEF (heart failure with reduced ejection fraction) ([place]) 08/06/2020  . Acute on chronic combined systolic and diastolic CHF (congestive heart failure) ([place]) 07/01/2020  . Acute on chronic respiratory failure with hypoxia ([place]) 07/01/2020  . Chronic kidney disease, stage 3a ([place]) 07/01/2020  . Thrombocytopenia ([place]) 07/01/2020  . Positive D dimer 07/01/2020  . Acute hypoxemic respiratory failure ([place])   . PVD (peripheral vascular disease) ([place]) 08/29/2019  . Educated about COVID-19 virus infection 08/29/2019  . Bradycardia 09/05/2018  . Dyslipidemia 09/05/2018  . AAA (abdominal aortic aneurysm) without rupture ([place]) 09/05/2018  . Heart block 03/10/2018  . Cardiomyopathy ([place]) 10/29/2017  . TOBACCO ABUSE 11/30/2008  . CHRONIC SYSTOLIC HEART FAILURE 28/00/3491  . Peripheral arterial occlusive disease ([place]) 11/30/2008  . GLAUCOMA 11/29/2008  . Essential hypertension 11/29/2008  . Coronary atherosclerosis 11/29/2008  . Monomorphic ventricular tachycardia ([place]) 11/29/2008  . HFrEF (heart failure with reduced ejection fraction) ([place]) 11/29/2008   PCP:  Linus Mako, NP Pharmacy:   [place], [place] [address] Phone: [phone] Fax: [place], [place] [address] Phone: [phone] Fax: [phone]  [facility] [address] [place] [address] Phone: [phone] Fax: [phone]     Social Determinants of Health (SDOH) Interventions Financial Strain Interventions: Intervention Not Indicated Housing Interventions: Intervention Not Indicated Transportation Interventions: Intervention Not Indicated  Readmission Risk Interventions No flowsheet data found.  Edwin Baines, MSW, [place] Heart Failure Social Worker

## 2020-12-05 ENCOUNTER — Other Ambulatory Visit: Payer: Self-pay | Admitting: *Deleted

## 2020-12-05 ENCOUNTER — Encounter (HOSPITAL_COMMUNITY)
Admission: RE | Disposition: A | Discharge status: Home / Self Care | Payer: Self-pay | Source: Ambulatory Visit | Attending: Internal Medicine

## 2020-12-05 DIAGNOSIS — D8685 Sarcoid myocarditis: Secondary | ICD-10-CM

## 2020-12-05 DIAGNOSIS — I428 Other cardiomyopathies: Secondary | ICD-10-CM

## 2020-12-05 HISTORY — PX: RIGHT HEART CATH: CATH118263

## 2020-12-05 LAB — POCT I-STAT EG7
Acid-Base Excess: 4 mmol/L — ABNORMAL HIGH (ref 0.0–2.0)
Acid-Base Excess: 6 mmol/L — ABNORMAL HIGH (ref 0.0–2.0)
Bicarbonate: 28.9 mmol/L — ABNORMAL HIGH (ref 20.0–28.0)
Bicarbonate: 30.8 mmol/L — ABNORMAL HIGH (ref 20.0–28.0)
Calcium, Ion: 1.08 mmol/L — ABNORMAL LOW (ref 1.15–1.40)
Calcium, Ion: 1.22 mmol/L (ref 1.15–1.40)
HCT: 47 % (ref 39.0–52.0)
HCT: 49 % (ref 39.0–52.0)
Hemoglobin: 16 g/dL (ref 13.0–17.0)
Hemoglobin: 16.7 g/dL (ref 13.0–17.0)
O2 Saturation: 62 %
O2 Saturation: 64 %
Potassium: 3 mmol/L — ABNORMAL LOW (ref 3.5–5.1)
Potassium: 3.5 mmol/L (ref 3.5–5.1)
Sodium: 139 mmol/L (ref 135–145)
Sodium: 143 mmol/L (ref 135–145)
TCO2: 30 mmol/L (ref 22–32)
TCO2: 32 mmol/L (ref 22–32)
pCO2, Ven: 42.3 mmHg — ABNORMAL LOW (ref 44.0–60.0)
pCO2, Ven: 44.4 mmHg (ref 44.0–60.0)
pH, Ven: 7.442 — ABNORMAL HIGH (ref 7.250–7.430)
pH, Ven: 7.449 — ABNORMAL HIGH (ref 7.250–7.430)
pO2, Ven: 31 mmHg — CL (ref 32.0–45.0)
pO2, Ven: 32 mmHg (ref 32.0–45.0)

## 2020-12-05 LAB — BASIC METABOLIC PANEL
Anion gap: 8 (ref 5–15)
BUN: 38 mg/dL — ABNORMAL HIGH (ref 8–23)
CO2: 31 mmol/L (ref 22–32)
Calcium: 9.3 mg/dL (ref 8.9–10.3)
Chloride: 98 mmol/L (ref 98–111)
Creatinine, Ser: 2.08 mg/dL — ABNORMAL HIGH (ref 0.61–1.24)
GFR, Estimated: 33 mL/min — ABNORMAL LOW (ref >60)
Glucose, Bld: 127 mg/dL — ABNORMAL HIGH (ref 70–99)
Potassium: 4.4 mmol/L (ref 3.5–5.1)
Sodium: 137 mmol/L (ref 135–145)

## 2020-12-05 LAB — SURGICAL PCR SCREEN
MRSA, PCR: NEGATIVE
Staphylococcus aureus: NEGATIVE

## 2020-12-05 LAB — MAGNESIUM: Magnesium: 2.2 mg/dL (ref 1.7–2.4)

## 2020-12-05 SURGERY — RIGHT HEART CATH
Anesthesia: LOCAL

## 2020-12-05 MED ORDER — CHLORHEXIDINE GLUCONATE 4 % EX LIQD
60.0000 mL | Freq: Once | CUTANEOUS | Status: AC
  Administered 2020-12-05: 4 via TOPICAL

## 2020-12-05 MED ORDER — LIDOCAINE HCL (PF) 1 % IJ SOLN
INTRAMUSCULAR | Status: DC | PRN
  Administered 2020-12-05: 2 mL

## 2020-12-05 MED ORDER — LIDOCAINE HCL (PF) 1 % IJ SOLN
INTRAMUSCULAR | Status: AC
  Filled 2020-12-05: qty 30

## 2020-12-05 MED ORDER — SODIUM CHLORIDE 0.9 % IV SOLN
80.0000 mg | INTRAVENOUS | Status: AC
  Administered 2020-12-06: 80 mg
  Filled 2020-12-05: qty 2

## 2020-12-05 MED ORDER — HEPARIN (PORCINE) IN NACL 1000-0.9 UT/500ML-% IV SOLN
INTRAVENOUS | Status: AC
  Filled 2020-12-05: qty 1000

## 2020-12-05 MED ORDER — HYDRALAZINE HCL 20 MG/ML IJ SOLN: 10.0000 mg | INTRAMUSCULAR | Status: AC | PRN

## 2020-12-05 MED ORDER — HEPARIN (PORCINE) IN NACL 1000-0.9 UT/500ML-% IV SOLN
INTRAVENOUS | Status: DC | PRN
  Administered 2020-12-05: 500 mL

## 2020-12-05 MED ORDER — SODIUM CHLORIDE 0.9% FLUSH: 3.0000 mL | INTRAVENOUS | Status: DC | PRN

## 2020-12-05 MED ORDER — ONDANSETRON HCL 4 MG/2ML IJ SOLN: 4.0000 mg | Freq: Four times a day (QID) | INTRAMUSCULAR | Status: DC | PRN

## 2020-12-05 MED ORDER — LABETALOL HCL 5 MG/ML IV SOLN: 10.0000 mg | INTRAVENOUS | Status: AC | PRN

## 2020-12-05 MED ORDER — SODIUM CHLORIDE 0.9 % IV SOLN
250.0000 mL | INTRAVENOUS | Status: DC
  Administered 2020-12-06: 250 mL via INTRAVENOUS

## 2020-12-05 MED ORDER — SODIUM CHLORIDE 0.9% FLUSH: 3.0000 mL | Freq: Two times a day (BID) | INTRAVENOUS | Status: DC

## 2020-12-05 MED ORDER — SODIUM CHLORIDE 0.9 % IV SOLN: 250.0000 mL | INTRAVENOUS | Status: DC | PRN

## 2020-12-05 MED ORDER — CEFAZOLIN SODIUM-DEXTROSE 2-4 GM/100ML-% IV SOLN
2.0000 g | INTRAVENOUS | Status: AC
  Administered 2020-12-06: 2 g via INTRAVENOUS
  Filled 2020-12-05: qty 100

## 2020-12-05 MED ORDER — SODIUM CHLORIDE 0.9% FLUSH
3.0000 mL | Freq: Two times a day (BID) | INTRAVENOUS | Status: DC
  Administered 2020-12-05 – 2020-12-07 (×4): 3 mL via INTRAVENOUS

## 2020-12-05 MED ORDER — DOFETILIDE 125 MCG PO CAPS
125.0000 ug | ORAL_CAPSULE | Freq: Two times a day (BID) | ORAL | Status: DC
  Administered 2020-12-05 – 2020-12-06 (×2): 125 ug via ORAL
  Filled 2020-12-05 (×3): qty 1

## 2020-12-05 MED ORDER — ACETAMINOPHEN 325 MG PO TABS: 650.0000 mg | ORAL_TABLET | ORAL | Status: DC | PRN

## 2020-12-05 MED ORDER — SODIUM CHLORIDE 0.9 % IV SOLN: INTRAVENOUS | Status: DC

## 2020-12-05 MED ORDER — APIXABAN 5 MG PO TABS: 5.0000 mg | ORAL_TABLET | Freq: Two times a day (BID) | ORAL | Status: DC

## 2020-12-05 MED ORDER — CHLORHEXIDINE GLUCONATE 4 % EX LIQD
60.0000 mL | Freq: Once | CUTANEOUS | Status: AC
  Administered 2020-12-06: 4 via TOPICAL
  Filled 2020-12-05: qty 60

## 2020-12-05 SURGICAL SUPPLY — 5 items
CATH SWAN GANZ 7F STRAIGHT (CATHETERS) ×1 IMPLANT
PACK CARDIAC CATHETERIZATION (CUSTOM PROCEDURE TRAY) ×2 IMPLANT
TRANSDUCER W/STOPCOCK (MISCELLANEOUS) ×2 IMPLANT
TUBING ART PRESS 72  MALE/FEM (TUBING) ×2
TUBING ART PRESS 72 MALE/FEM (TUBING) IMPLANT

## 2020-12-05 NOTE — Patient Outreach (Signed)
[facility]) Care Management  12/05/2020  [name] 06-Nov-1945 [id number]   [place] coordination -notification of admission  [facility] RN CM made aware of pt [place] admission by [facility] RN CM hospital liaison for shortness of breath. He has been admitted and being treated for Afib RVR and CHF exacerbation/volume overload plus an nonischemic CM (EF 20%), NYHA Class III CHF, and sarcoid  [facility] hospital liaison will follow.  12/05/20 right heart cath completed by Dr D Bensimhon Placed on [place] RN CM will follow up pending discharge and update from [place]. Lavina Hamman, RN, BSN, [place] Coordinator Office number [phone] Mobile number [phone]  Main [facility] number [phone] Fax number [phone]

## 2020-12-05 NOTE — Progress Notes (Signed)
Post dose EKG is reviewed with Dr. Quentin Ore. QT is lengthening, given renal function as well, will reduce to 125mg  dose for this evening Tele reviewed, NSMMVT noted, known for him at baseline Tommye Standard, PA-C

## 2020-12-05 NOTE — Progress Notes (Signed)
Progress Note  Patient Name: [name] Date of Encounter: 12/05/2020  [facility] Cardiologist: Dr. Percival Spanish  Subjective   Breathing easier, not sure he can tell any difference in his heart rhythm. No CP  Inpatient Medications    Scheduled Meds: . atorvastatin  40 mg Oral q1800  . cholecalciferol  1,000 Units Oral BID  . dapagliflozin propanediol  10 mg Oral Daily  . dofetilide  250 mcg Oral BID  . folic acid  1 mg Oral Daily  . furosemide  80 mg Intravenous BID  . isosorbide mononitrate  15 mg Oral Daily  . mouth rinse  15 mL Mouth Rinse BID  . methotrexate  10 mg Oral Weekly  . metoprolol succinate  25 mg Oral BID  . predniSONE  30 mg Oral Q breakfast  . sodium chloride flush  3 mL Intravenous Q12H  . sodium chloride flush  3 mL Intravenous Q12H  . sodium chloride flush  3 mL Intravenous Q12H   Continuous Infusions: . sodium chloride    . sodium chloride    . sodium chloride    . sodium chloride 10 mL/hr at 12/05/20 0651   PRN Meds: sodium chloride, sodium chloride, sodium chloride, acetaminophen, sodium chloride flush, sodium chloride flush, sodium chloride flush   Vital Signs    Vitals:   12/04/20 2338 12/05/20 0505 12/05/20 0506 12/05/20 0801  BP: 94/62 100/73  101/78  Pulse: 63 71  64  Resp: 18   18  Temp: (!) 97.5 F (36.4 C)  (!) 97.2 F (36.2 C) 97.6 F (36.4 C)  TempSrc: Oral  Oral Oral  SpO2: 94% 97%  96%  Weight:   67.1 kg   Height:        Intake/Output Summary (Last 24 hours) at 12/05/2020 0823 Last data filed at 12/05/2020 0651 Gross per 24 hour  Intake 114.76 ml  Output 2825 ml  Net -2710.24 ml   Last 3 Weights 12/05/2020 12/04/2020 12/03/2020  Weight (lbs) 148 lb 153 lb 6.4 oz 152 lb 14.4 oz  Weight (kg) 67.132 kg 69.582 kg 69.355 kg      Telemetry    SR, 60's-80's 1st degree AVblock, occ PVCs, rare NSVTs noted,  PAT - Personally Reviewed  ECG    SR 68bpm, RBBB, 1st degree AVBlock, LAD, reviewed with Dr. Quentin Ore, QT 500,  QRS 168ms, QTc 468 - Personally Reviewed  Physical Exam   GEN: No acute distress.   Neck: +10 JVD Cardiac: RRR, no murmurs, rubs.  Respiratory: better, are CTA b/l this AM. GI: Soft, nontender, non-distended  MS: No edema; No deformity. Neuro:  Nonfocal  Psych: Normal affect   Labs    High Sensitivity Troponin:  No results for input(s): TROPONINIHS in the last 720 hours.    Chemistry Recent Labs  Lab 12/03/20 1444 12/04/20 0210 12/04/20 1104 12/05/20 0322  NA 143 138 138 137  K 3.8 5.1 4.0 4.4  CL 108 104 104 98  CO2 29 26 25 31   GLUCOSE 105* 158* 154* 127*  BUN 29* 28* 30* 38*  CREATININE 1.94* 1.96* 1.84* 2.08*  CALCIUM 8.9 9.1 9.2 9.3  PROT 6.3*  --   --   --   ALBUMIN 3.2*  --   --   --   AST 29  --   --   --   ALT 41  --   --   --   ALKPHOS 71  --   --   --   BILITOT  1.5*  --   --   --   GFRNONAA 36* 35* 38* 33*  ANIONGAP 6 8 9 8      Hematology Recent Labs  Lab 12/03/20 1444  WBC 8.3  RBC 3.84*  HGB 13.3  HCT 40.2  MCV 104.7*  MCH 34.6*  MCHC 33.1  RDW 18.2*  PLT 99*    BNP Recent Labs  Lab 12/03/20 1444  BNP >4,500.0*     DDimer No results for input(s): DDIMER in the last 168 hours.   Radiology    DG Chest Port 1V same Day  Result Date: 12/03/2020 CLINICAL DATA:  Chronic systolic heart failure EXAM: PORTABLE CHEST 1 VIEW COMPARISON:  August 06, 2020 FINDINGS: There is cardiomegaly with pulmonary vascularity normal. No adenopathy. No edema or airspace opacity. There is aortic atherosclerosis. There is an exostosis arising from the inferior aspect of the mid clavicle on the left. IMPRESSION: Cardiomegaly. No edema or airspace opacity. Pulmonary vascularity within normal limits. Aortic Atherosclerosis (ICD10-I70.0). Electronically Signed   By: Lowella Grip III M.D.   On: 12/03/2020 16:22    Cardiac Studies   March 2022, 2 week monitor 1. Sinus Rhythm. First Degree AV Block was present - avg HR of 80 bpm. 2. Bundle Branch  Block/IVCD was present.  3. 43 runs of nonsustained ventricular tachycardia occurred, the run with the  fastest interval lasting 6 beats with a max rate of 214 bpm, the longest lasting 14 beats with an avg rate of 162 bpm.  4. Atrial Fibrillation occurred (7% burden), ranging from 75-186 bpm (avg of 135 bpm), the longest lasting 11 hours 8 mins with an avg rate of 139 bpm.  5. Rare PACs 6. Occasional PVCs (3.4%, 41740)    10/21/20; PET/CT Impression:  1. Findings are suggestive of inflammatory process involving predominantly right atrium, 5/17 segments of the left ventricle and  to a lesser degree left atrium.  2. Coronary artery calcifications and aortic calcifications.  3. No evidence of metabolically active sarcoid outside of the heart.    RHC/LHC 07/2020 -moderate non-obstructive CAD Echo 11/21 LVEF 20-25%, G2DD, diffuse HK, moderately reduced RV CMRI 07/2020 - LVEF 24%These findings are consistent with non-ischemic cardiomyopathy and are highly suspicious for diffuse cardiac sarcoidosis. ECV is very elevated at 45% (amyloid range, however no other findings suggestive of cardiac amyloidosis), however most probably represent diffuse scarring.    07/01/2020: TTE IMPRESSIONS  1. Left ventricular ejection fraction, by estimation, is 20 to 25%. The  left ventricle has severely decreased function. The left ventricle  demonstrates global hypokinesis. The left ventricular internal cavity size  was moderately dilated. Left  ventricular diastolic parameters were normal.  2. Right ventricular systolic function is moderately reduced. The right  ventricular size is moderately enlarged. There is moderately elevated  pulmonary artery systolic pressure.  3. Left atrial size was mildly dilated.  4. Degree of functional MR worse compared to echo done July 2019. The  mitral valve is normal in structure. Moderate mitral valve regurgitation.  No evidence of mitral stenosis.   5. The aortic valve is normal in structure. Aortic valve regurgitation is  mild. No aortic stenosis is present.  6. The inferior vena cava is dilated in size with <50% respiratory  variability, suggesting right atrial pressure of 15 mmHg.   Patient Profile     75 y.o. male with PMHx of chronic CHF (combined), NICM, also mod reduced RVEF, HTN, CKD (III), former smoker (chest CT 11/21 showing bilateral pulmonary nodules and  mild mediastinal and hilar adenopathy), PAD )s/p bifem bypass and aorto-iliac aneursym repair), AFib, PVCs, NSVT, RBBB (trifascicular block), cardiac sarcoid admitted with AFib RVR and acute/chronic CHF exacerbation  Assessment & Plan    1. Paroxysmal AFib     CHA2DS2Vasc is 4, on Eliquis appropriately dosed, though was not taking it     Eliquis started here > held for RHC today and ICD implant tomorrow  He had spontaneous conversion to SR Dr.Lambert has seen and examined the patient this morning We have reviewed labs and EKG together K+ 4.4 Mag 2.2 Creat 2.08 (up from his baseline, discussed 273mcg dose, will continue), baeline Calc CrCl is 40's If creat continues to rise we may need to back off dose QTc stable/acceptable given wide RBBB       2. NICM 3. Acute/chronic CHF  4. Cardiacsarcoid     BiVe failure     HF team is on board      Planned for [place] today and CRT-D tomorrow Dr. Quentin Ore will implant tomorrow, he has seen and discussed rational for ICD implant, the  Procedure, potential risks and benefits with the patient, he is agreeable to proceed   4. Cardiac sarcoid (active) 5. PVCs, NSVT noted on out pt holter at baseline     appreciate HF team contnued prednisone at the 30mg  dosing  NO bactrim given Tikosyn D/w pharmacy they are looking into perhaps Dapsone Methotrexate started C/w AHF team      6. HTN     relative hypotension with meds, looks ok   7. CKD      follow, up today     Diuretics with HF team    For questions or  updates, please contact [place] Please consult [email or web address] for contact info under        Signed, [place], PA-C  12/05/2020, 8:23 AM   As above.

## 2020-12-05 NOTE — Progress Notes (Signed)
Pharmacy: Dofetilide (Tikosyn) - Follow Up Assessment and Electrolyte Replacement  Pharmacy consulted to assist in monitoring and replacing electrolytes in this 75 y.o. male admitted on 12/03/2020 undergoing dofetilide initiation. First dofetilide dose: 12/04/20 AM, 250 mcg BID  Labs:    Component Value Date/Time   K 4.4 12/05/2020 0322   MG 2.2 12/05/2020 0322     Plan: Potassium: K >/= 4: No additional supplementation needed  Magnesium: Mg > 2: No additional supplementation needed   Noted drug interactions: On high dose prednisone and methotrexate for cardiac sarcoid.  Needs prophylaxis for PCP, but unable to use Septra due to interaction with Tikosyn.  Will initiate dapsone once G6PD results back.   Thank you for allowing pharmacy to participate in this patient's care   [place] Crane, Roylene Reason, [facility] Clinical Pharmacist  12/05/2020 7:24 AM   [facility] pharmacy phone numbers are listed on [email or web address]

## 2020-12-05 NOTE — Care Management (Signed)
12-05-20 1514 Case Manager spoke with the patient regarding his Tikosyn dosage. Patient usually gets his medications from the [facility]. Case Manager called the [facility] and the Pharmacy is open on Saturday from 8:00 am -6:30 pm. Patient is stating that if he is discharged on Saturday he will not be able to drive to [facility] to pick up medications. He will have to pick up Monday or have them mailed to him. Patient does not have family support to assist with picking up medications. Case Manager did really information to the PA. Case Manager will continue to follow for additional transition of care needs. Bethena Roys, RN,BSN Case Manager

## 2020-12-05 NOTE — Progress Notes (Signed)
Advanced Heart Failure Rounding Note  PCP-Cardiologist: No primary care provider on file.   Subjective:   Admitted with A fib RVR and volume overload.    Back in NSR. Now on Tikosyn.   Feels good. Denies SOB, orthopnea or PND.   Creatinine 1.9-> 2.1  RHC today looked ok   RA 1 RV 31/2 PA 30/15 (20) PCW 13 Fick 3.9/2.1  Objective:   Weight Range: 67.1 kg Body mass index is 20.94 kg/m.   Vital Signs:   Temp:  [97.2 F (36.2 C)-97.8 F (36.6 C)] 97.7 F (36.5 C) (04/14 1205) Pulse Rate:  [61-72] 61 (04/14 1205) Resp:  [18] 18 (04/14 1205) BP: (92-119)/(62-79) 92/67 (04/14 1204) SpO2:  [94 %-100 %] 96 % (04/14 1338) Weight:  [67.1 kg] 67.1 kg (04/14 0506) Last BM Date: 12/04/20  Weight change: Filed Weights   12/03/20 1336 12/04/20 0300 12/05/20 0506  Weight: 69.4 kg 69.6 kg 67.1 kg    Intake/Output:   Intake/Output Summary (Last 24 hours) at 12/05/2020 1358 Last data filed at 12/05/2020 1204 Gross per 24 hour  Intake 117.76 ml  Output 4150 ml  Net -4032.24 ml      Physical Exam    General:  Well appearing. No resp difficulty HEENT: normal Neck: supple. no JVD. Carotids 2+ bilat; no bruits. No lymphadenopathy or thryomegaly appreciated. Cor: PMI nondisplaced. Regular rate & rhythm. No rubs, gallops or murmurs. Lungs: clear Abdomen: soft, nontender, nondistended. No hepatosplenomegaly. No bruits or masses. Good bowel sounds. Extremities: no cyanosis, clubbing, rash, edema Neuro: alert & orientedx3, cranial nerves grossly intact. moves all 4 extremities w/o difficulty. Affect pleasant   Telemetry   SR 60s Rare NSVT Personally reviewed  Labs    CBC Recent Labs    12/03/20 1444  WBC 8.3  NEUTROABS 5.1  HGB 13.3  HCT 40.2  MCV 104.7*  PLT 99*   Basic Metabolic Panel Recent Labs    12/04/20 1104 12/04/20 1957 12/05/20 0322  NA 138  --  137  K 4.0  --  4.4  CL 104  --  98  CO2 25  --  31  GLUCOSE 154*  --  127*  BUN 30*  --  38*   CREATININE 1.84*  --  2.08*  CALCIUM 9.2  --  9.3  MG 1.6* 2.4 2.2   Liver Function Tests Recent Labs    12/03/20 1444  AST 29  ALT 41  ALKPHOS 71  BILITOT 1.5*  PROT 6.3*  ALBUMIN 3.2*   No results for input(s): LIPASE, AMYLASE in the last 72 hours. Cardiac Enzymes No results for input(s): CKTOTAL, CKMB, CKMBINDEX, TROPONINI in the last 72 hours.  BNP: BNP (last 3 results) Recent Labs    06/30/20 2354 08/06/20 0941 12/03/20 1444  BNP 3,205.6* >4,500.0* >4,500.0*    ProBNP (last 3 results) No results for input(s): PROBNP in the last 8760 hours.   D-Dimer No results for input(s): DDIMER in the last 72 hours. Hemoglobin A1C No results for input(s): HGBA1C in the last 72 hours. Fasting Lipid Panel No results for input(s): CHOL, HDL, LDLCALC, TRIG, CHOLHDL, LDLDIRECT in the last 72 hours. Thyroid Function Tests No results for input(s): TSH, T4TOTAL, T3FREE, THYROIDAB in the last 72 hours.  Invalid input(s): FREET3  Other results:   Imaging    No results found.   Medications:     Scheduled Medications: . [MAR Hold] atorvastatin  40 mg Oral q1800  . [MAR Hold] cholecalciferol  1,000 Units  Oral BID  . [MAR Hold] dofetilide  250 mcg Oral BID  . [MAR Hold] folic acid  1 mg Oral Daily  . [MAR Hold] furosemide  80 mg Intravenous BID  . [MAR Hold] isosorbide mononitrate  15 mg Oral Daily  . [MAR Hold] mouth rinse  15 mL Mouth Rinse BID  . [MAR Hold] methotrexate  10 mg Oral Weekly  . [MAR Hold] metoprolol succinate  25 mg Oral BID  . [MAR Hold] predniSONE  30 mg Oral Q breakfast  . [MAR Hold] sodium chloride flush  3 mL Intravenous Q12H  . [MAR Hold] sodium chloride flush  3 mL Intravenous Q12H  . [MAR Hold] sodium chloride flush  3 mL Intravenous Q12H  . [MAR Hold] sodium chloride flush  3 mL Intravenous Q12H    Infusions: . [MAR Hold] sodium chloride    . [MAR Hold] sodium chloride    . sodium chloride    . sodium chloride 10 mL/hr at 12/05/20 0651     PRN Medications: [MAR Hold] sodium chloride, [MAR Hold] sodium chloride, sodium chloride, [MAR Hold] acetaminophen, Heparin (Porcine) in NaCl, lidocaine (PF), [MAR Hold] sodium chloride flush, [MAR Hold] sodium chloride flush, sodium chloride flush     Assessment/Plan   1. A fib RVR -Saw EP yesterday and was back in A fib RVR. Set up for direct admit and Tikosyn.  - In SR today. Loading Tikosyn per EP - On Eliquis. No bleeding - Continue Toprol XL at current dose. .   2. Cardiac Sarcoid - Followed by Dr Marcene Brawn at [facility].  -07/2020 CMRI suggestive sarcoid--> Cardiac Pet--> Cardiac Sarcoid -Referred to Dr Marcene Brawn at [facility] and was evaluated 3/2 with plans to continue prednisone and repeat Pet after 3 months. At that point consider methotrexate.  - Supposed to be on prednisione 30 mg daily but has only been on 10 mg daily.  - Continue prednisone 30mg  daily. MTX added 4/13 - Continue bactrim 1 tab M-W-F  - For CRT-D tomorrow  3. Acute/ Chronic Combined HFrEF indicate nonobstructive CAD on cath in 2007, study report not on file) - Most recent echo 11/21 LVEF 20-25%, G2DD, diffuse HK, moderately reduced RV -cMRI suggestive of sarcoid. EF 24% - Cardiac Pet as noted above with cardiac sarcoid - Presenting with NYHA IV symptoms. Improved today.  - Volume status much improved - Stop lasix  - Cardiac output margimal - Continue Toprol XL as noted above - Hold entresto/spiro for now with elevated creatinine. RHC in am    4. CKD Stage IIIa Creatinine  Baseline 08-27-13. - Creatinine trending 1.9 -> 2.1 - Volume status low. Stop lasix  5. PVCs/NSVT  Eventually will need ICD - For CRT-D tomorrow  6. PAD  -S/P Bifem BPG and aortoiliac aneurysm repair - Continue statin.    Length of Stay: 2  Glori Bickers, MD  12/05/2020, 1:58 PM  Advanced Heart Failure Team Pager [phone] (M-F; 7a - 5p)  Please contact [facility] for night-coverage after hours (5p -7a ) and weekends on  [email or web address]

## 2020-12-05 NOTE — H&P (View-Only) (Signed)
Progress Note  Patient Name: [name] Date of Encounter: 12/05/2020  [facility] Cardiologist: Dr. Percival Spanish  Subjective   Breathing easier, not sure he can tell any difference in his heart rhythm. No CP  Inpatient Medications    Scheduled Meds: . atorvastatin  40 mg Oral q1800  . cholecalciferol  1,000 Units Oral BID  . dapagliflozin propanediol  10 mg Oral Daily  . dofetilide  250 mcg Oral BID  . folic acid  1 mg Oral Daily  . furosemide  80 mg Intravenous BID  . isosorbide mononitrate  15 mg Oral Daily  . mouth rinse  15 mL Mouth Rinse BID  . methotrexate  10 mg Oral Weekly  . metoprolol succinate  25 mg Oral BID  . predniSONE  30 mg Oral Q breakfast  . sodium chloride flush  3 mL Intravenous Q12H  . sodium chloride flush  3 mL Intravenous Q12H  . sodium chloride flush  3 mL Intravenous Q12H   Continuous Infusions: . sodium chloride    . sodium chloride    . sodium chloride    . sodium chloride 10 mL/hr at 12/05/20 0651   PRN Meds: sodium chloride, sodium chloride, sodium chloride, acetaminophen, sodium chloride flush, sodium chloride flush, sodium chloride flush   Vital Signs    Vitals:   12/04/20 2338 12/05/20 0505 12/05/20 0506 12/05/20 0801  BP: 94/62 100/73  101/78  Pulse: 63 71  64  Resp: 18   18  Temp: (!) 97.5 F (36.4 C)  (!) 97.2 F (36.2 C) 97.6 F (36.4 C)  TempSrc: Oral  Oral Oral  SpO2: 94% 97%  96%  Weight:   67.1 kg   Height:        Intake/Output Summary (Last 24 hours) at 12/05/2020 0823 Last data filed at 12/05/2020 0651 Gross per 24 hour  Intake 114.76 ml  Output 2825 ml  Net -2710.24 ml   Last 3 Weights 12/05/2020 12/04/2020 12/03/2020  Weight (lbs) 148 lb 153 lb 6.4 oz 152 lb 14.4 oz  Weight (kg) 67.132 kg 69.582 kg 69.355 kg      Telemetry    SR, 60's-80's 1st degree AVblock, occ PVCs, rare NSVTs noted,  PAT - Personally Reviewed  ECG    SR 68bpm, RBBB, 1st degree AVBlock, LAD, reviewed with Dr. Quentin Ore, QT 500,  QRS 168ms, QTc 468 - Personally Reviewed  Physical Exam   GEN: No acute distress.   Neck: +10 JVD Cardiac: RRR, no murmurs, rubs.  Respiratory: better, are CTA b/l this AM. GI: Soft, nontender, non-distended  MS: No edema; No deformity. Neuro:  Nonfocal  Psych: Normal affect   Labs    High Sensitivity Troponin:  No results for input(s): TROPONINIHS in the last 720 hours.    Chemistry Recent Labs  Lab 12/03/20 1444 12/04/20 0210 12/04/20 1104 12/05/20 0322  NA 143 138 138 137  K 3.8 5.1 4.0 4.4  CL 108 104 104 98  CO2 29 26 25 31   GLUCOSE 105* 158* 154* 127*  BUN 29* 28* 30* 38*  CREATININE 1.94* 1.96* 1.84* 2.08*  CALCIUM 8.9 9.1 9.2 9.3  PROT 6.3*  --   --   --   ALBUMIN 3.2*  --   --   --   AST 29  --   --   --   ALT 41  --   --   --   ALKPHOS 71  --   --   --   BILITOT  1.5*  --   --   --   GFRNONAA 36* 35* 38* 33*  ANIONGAP 6 8 9 8      Hematology Recent Labs  Lab 12/03/20 1444  WBC 8.3  RBC 3.84*  HGB 13.3  HCT 40.2  MCV 104.7*  MCH 34.6*  MCHC 33.1  RDW 18.2*  PLT 99*    BNP Recent Labs  Lab 12/03/20 1444  BNP >4,500.0*     DDimer No results for input(s): DDIMER in the last 168 hours.   Radiology    DG Chest Port 1V same Day  Result Date: 12/03/2020 CLINICAL DATA:  Chronic systolic heart failure EXAM: PORTABLE CHEST 1 VIEW COMPARISON:  August 06, 2020 FINDINGS: There is cardiomegaly with pulmonary vascularity normal. No adenopathy. No edema or airspace opacity. There is aortic atherosclerosis. There is an exostosis arising from the inferior aspect of the mid clavicle on the left. IMPRESSION: Cardiomegaly. No edema or airspace opacity. Pulmonary vascularity within normal limits. Aortic Atherosclerosis (ICD10-I70.0). Electronically Signed   By: Lowella Grip III M.D.   On: 12/03/2020 16:22    Cardiac Studies   March 2022, 2 week monitor 1. Sinus Rhythm. First Degree AV Block was present - avg HR of 80 bpm. 2. Bundle Branch  Block/IVCD was present.  3. 43 runs of nonsustained ventricular tachycardia occurred, the run with the  fastest interval lasting 6 beats with a max rate of 214 bpm, the longest lasting 14 beats with an avg rate of 162 bpm.  4. Atrial Fibrillation occurred (7% burden), ranging from 75-186 bpm (avg of 135 bpm), the longest lasting 11 hours 8 mins with an avg rate of 139 bpm.  5. Rare PACs 6. Occasional PVCs (3.4%, 74128)    10/21/20; PET/CT Impression:  1. Findings are suggestive of inflammatory process involving predominantly right atrium, 5/17 segments of the left ventricle and  to a lesser degree left atrium.  2. Coronary artery calcifications and aortic calcifications.  3. No evidence of metabolically active sarcoid outside of the heart.    RHC/LHC 07/2020 -moderate non-obstructive CAD Echo 11/21 LVEF 20-25%, G2DD, diffuse HK, moderately reduced RV CMRI 07/2020 - LVEF 24%These findings are consistent with non-ischemic cardiomyopathy and are highly suspicious for diffuse cardiac sarcoidosis. ECV is very elevated at 45% (amyloid range, however no other findings suggestive of cardiac amyloidosis), however most probably represent diffuse scarring.    07/01/2020: TTE IMPRESSIONS  1. Left ventricular ejection fraction, by estimation, is 20 to 25%. The  left ventricle has severely decreased function. The left ventricle  demonstrates global hypokinesis. The left ventricular internal cavity size  was moderately dilated. Left  ventricular diastolic parameters were normal.  2. Right ventricular systolic function is moderately reduced. The right  ventricular size is moderately enlarged. There is moderately elevated  pulmonary artery systolic pressure.  3. Left atrial size was mildly dilated.  4. Degree of functional MR worse compared to echo done July 2019. The  mitral valve is normal in structure. Moderate mitral valve regurgitation.  No evidence of mitral stenosis.   5. The aortic valve is normal in structure. Aortic valve regurgitation is  mild. No aortic stenosis is present.  6. The inferior vena cava is dilated in size with <50% respiratory  variability, suggesting right atrial pressure of 15 mmHg.   Patient Profile     75 y.o. male with PMHx of chronic CHF (combined), NICM, also mod reduced RVEF, HTN, CKD (III), former smoker (chest CT 11/21 showing bilateral pulmonary nodules and  mild mediastinal and hilar adenopathy), PAD )s/p bifem bypass and aorto-iliac aneursym repair), AFib, PVCs, NSVT, RBBB (trifascicular block), cardiac sarcoid admitted with AFib RVR and acute/chronic CHF exacerbation  Assessment & Plan    1. Paroxysmal AFib     CHA2DS2Vasc is 4, on Eliquis appropriately dosed, though was not taking it     Eliquis started here > held for RHC today and ICD implant tomorrow  He had spontaneous conversion to SR Dr.Lambert has seen and examined the patient this morning We have reviewed labs and EKG together K+ 4.4 Mag 2.2 Creat 2.08 (up from his baseline, discussed 274mcg dose, will continue), baeline Calc CrCl is 40's If creat continues to rise we may need to back off dose QTc stable/acceptable given wide RBBB       2. NICM 3. Acute/chronic CHF  4. Cardiacsarcoid     BiVe failure     HF team is on board      Planned for [facility] today and CRT-D tomorrow Dr. Quentin Ore will implant tomorrow, he has seen and discussed rational for ICD implant, the  Procedure, potential risks and benefits with the patient, he is agreeable to proceed   4. Cardiac sarcoid (active) 5. PVCs, NSVT noted on out pt holter at baseline     appreciate HF team contnued prednisone at the 30mg  dosing  NO bactrim given Tikosyn D/w pharmacy they are looking into perhaps Dapsone Methotrexate started C/w AHF team      6. HTN     relative hypotension with meds, looks ok   7. CKD      follow, up today     Diuretics with HF team    For questions or  updates, please contact [place] Please consult [email or web address] for contact info under        Signed, Baldwin Jamaica, PA-C  12/05/2020, 8:23 AM   As above.

## 2020-12-05 NOTE — Progress Notes (Signed)
CCMD reported to this writer that patient had a 16 beat run of VTach. Patient asymptomatic. No c/o pain. No SOB. PA notified.

## 2020-12-05 NOTE — Interval H&P Note (Signed)
History and Physical Interval Note:  12/05/2020 1:47 PM  [name]  has presented today for surgery, with the diagnosis of chf.  The various methods of treatment have been discussed with the patient and family. After consideration of risks, benefits and other options for treatment, the patient has consented to  Procedure(s): RIGHT HEART CATH (N/A) as a surgical intervention.  The patient's history has been reviewed, patient examined, no change in status, stable for surgery.  I have reviewed the patient's chart and labs.  Questions were answered to the patient's satisfaction.     Edword Cu

## 2020-12-05 NOTE — TOC Progression Note (Addendum)
Transition of Care (TOC) - Progression Note  Heart Failure   Patient Details  Name: [name] MRN: [id number] Date of Birth: 22-Aug-1946  Transition of Care [facility]) CM/SW [place], [place] Phone Number: 12/05/2020, 8:52 AM  Clinical Narrative:    CSW called [facility] and scheduled patient a primary care appointment for May 12th, 2022 at 11:00am and added this to the patient's AVS.  CSW sent over the [employer] referral to the [facility].  CSW will continue to follow for d/c needs.      Barriers to Discharge: Continued Medical Work up  Expected Discharge Plan and Services   In-house Referral: Clinical Social Work Discharge Planning Services: CM Consult   Living arrangements for the past 2 months: Single Family Home                                       Social Determinants of Health (SDOH) Interventions Financial Strain Interventions: Intervention Not Indicated Housing Interventions: Intervention Not Indicated Transportation Interventions: Intervention Not Indicated  Readmission Risk Interventions No flowsheet data found.  Rachel Rison, MSW, [place] Heart Failure Social Worker

## 2020-12-06 ENCOUNTER — Other Ambulatory Visit (HOSPITAL_COMMUNITY): Payer: Self-pay

## 2020-12-06 ENCOUNTER — Inpatient Hospital Stay (HOSPITAL_COMMUNITY)
Admission: RE | Disposition: A | Discharge status: Home / Self Care | Payer: Self-pay | Source: Ambulatory Visit | Attending: Internal Medicine

## 2020-12-06 ENCOUNTER — Encounter (HOSPITAL_COMMUNITY): Payer: Self-pay | Admitting: Internal Medicine

## 2020-12-06 HISTORY — PX: BIV ICD INSERTION CRT-D: EP1195

## 2020-12-06 LAB — BASIC METABOLIC PANEL
Anion gap: 9 (ref 5–15)
BUN: 45 mg/dL — ABNORMAL HIGH (ref 8–23)
CO2: 25 mmol/L (ref 22–32)
Calcium: 9.1 mg/dL (ref 8.9–10.3)
Chloride: 98 mmol/L (ref 98–111)
Creatinine, Ser: 2.06 mg/dL — ABNORMAL HIGH (ref 0.61–1.24)
GFR, Estimated: 33 mL/min — ABNORMAL LOW (ref >60)
Glucose, Bld: 129 mg/dL — ABNORMAL HIGH (ref 70–99)
Potassium: 3.8 mmol/L (ref 3.5–5.1)
Sodium: 132 mmol/L — ABNORMAL LOW (ref 135–145)

## 2020-12-06 LAB — MAGNESIUM: Magnesium: 2.1 mg/dL (ref 1.7–2.4)

## 2020-12-06 LAB — GLUCOSE 6 PHOSPHATE DEHYDROGENASE
G6PDH: 10.1 U/g{Hb} (ref 4.8–15.7)
Hemoglobin: 14 g/dL (ref 13.0–17.7)

## 2020-12-06 SURGERY — BIV ICD INSERTION CRT-D

## 2020-12-06 MED ORDER — LIDOCAINE HCL (PF) 1 % IJ SOLN
INTRAMUSCULAR | Status: DC | PRN
  Administered 2020-12-06: 45 mL

## 2020-12-06 MED ORDER — SULFAMETHOXAZOLE-TRIMETHOPRIM 800-160 MG PO TABS
1.0000 | ORAL_TABLET | ORAL | 3 refills | Status: DC
  Filled 2020-12-06: qty 12, 28d supply, fill #0

## 2020-12-06 MED ORDER — PHENYLEPHRINE 40 MCG/ML (10ML) SYRINGE FOR IV PUSH (FOR BLOOD PRESSURE SUPPORT)
PREFILLED_SYRINGE | INTRAVENOUS | Status: AC
  Filled 2020-12-06: qty 10

## 2020-12-06 MED ORDER — NOREPINEPHRINE BITARTRATE 1 MG/ML IV SOLN
INTRAVENOUS | Status: DC | PRN
  Administered 2020-12-06: 4 ug/min via INTRAVENOUS

## 2020-12-06 MED ORDER — DAPSONE 100 MG PO TABS
100.0000 mg | ORAL_TABLET | Freq: Every day | ORAL | Status: DC
  Filled 2020-12-06: qty 1

## 2020-12-06 MED ORDER — METHOTREXATE 2.5 MG PO TABS
10.0000 mg | ORAL_TABLET | ORAL | 3 refills | Status: DC
  Filled 2020-12-06: qty 8, 14d supply, fill #0
  Filled 2020-12-06: qty 24, 42d supply, fill #0

## 2020-12-06 MED ORDER — SODIUM CHLORIDE 0.9 % IV SOLN
INTRAVENOUS | Status: AC | PRN
  Administered 2020-12-06: 999 mL/h via INTRAVENOUS

## 2020-12-06 MED ORDER — HEPARIN (PORCINE) IN NACL 1000-0.9 UT/500ML-% IV SOLN
INTRAVENOUS | Status: AC
  Filled 2020-12-06: qty 500

## 2020-12-06 MED ORDER — DOFETILIDE 125 MCG PO CAPS: 125.0000 ug | ORAL_CAPSULE | Freq: Two times a day (BID) | ORAL | 3 refills | Status: DC

## 2020-12-06 MED ORDER — LIDOCAINE HCL 1 % IJ SOLN
INTRAMUSCULAR | Status: AC
  Filled 2020-12-06: qty 60

## 2020-12-06 MED ORDER — SODIUM CHLORIDE 0.9 % IV SOLN
INTRAVENOUS | Status: AC
  Filled 2020-12-06: qty 2

## 2020-12-06 MED ORDER — AMIODARONE HCL 200 MG PO TABS
200.0000 mg | ORAL_TABLET | Freq: Two times a day (BID) | ORAL | Status: DC
  Administered 2020-12-07: 200 mg via ORAL
  Filled 2020-12-06 (×2): qty 1

## 2020-12-06 MED ORDER — PHENYLEPHRINE 40 MCG/ML (10ML) SYRINGE FOR IV PUSH (FOR BLOOD PRESSURE SUPPORT)
PREFILLED_SYRINGE | INTRAVENOUS | Status: DC | PRN
  Administered 2020-12-06: 120 ug via INTRAVENOUS

## 2020-12-06 MED ORDER — CEFAZOLIN SODIUM-DEXTROSE 2-4 GM/100ML-% IV SOLN
INTRAVENOUS | Status: AC
  Filled 2020-12-06: qty 100

## 2020-12-06 MED ORDER — DAPSONE 100 MG PO TABS
100.0000 mg | ORAL_TABLET | Freq: Every day | ORAL | 6 refills | Status: DC
  Filled 2020-12-06: qty 14, 14d supply, fill #0

## 2020-12-06 MED ORDER — POTASSIUM CHLORIDE CRYS ER 20 MEQ PO TBCR
40.0000 meq | EXTENDED_RELEASE_TABLET | Freq: Once | ORAL | Status: AC
  Administered 2020-12-06: 40 meq via ORAL
  Filled 2020-12-06: qty 2

## 2020-12-06 MED ORDER — MIDAZOLAM HCL 5 MG/5ML IJ SOLN
INTRAMUSCULAR | Status: DC | PRN
  Administered 2020-12-06: 1 mg via INTRAVENOUS
  Administered 2020-12-06: 0.5 mg via INTRAVENOUS

## 2020-12-06 MED ORDER — SULFAMETHOXAZOLE-TRIMETHOPRIM 800-160 MG PO TABS
1.0000 | ORAL_TABLET | ORAL | Status: DC
  Filled 2020-12-06: qty 1

## 2020-12-06 MED ORDER — MIDAZOLAM HCL 5 MG/5ML IJ SOLN
INTRAMUSCULAR | Status: AC
  Filled 2020-12-06: qty 5

## 2020-12-06 MED ORDER — AMIODARONE HCL 200 MG PO TABS
200.0000 mg | ORAL_TABLET | ORAL | 3 refills | Status: DC
  Filled 2020-12-06: qty 42, 14d supply, fill #0

## 2020-12-06 MED ORDER — APIXABAN 5 MG PO TABS
5.0000 mg | ORAL_TABLET | Freq: Two times a day (BID) | ORAL | 3 refills | Status: DC
  Filled 2020-12-06: qty 60, 30d supply, fill #0

## 2020-12-06 MED ORDER — FENTANYL CITRATE (PF) 100 MCG/2ML IJ SOLN
INTRAMUSCULAR | Status: DC | PRN
  Administered 2020-12-06: 25 ug via INTRAVENOUS
  Administered 2020-12-06: 12.5 ug via INTRAVENOUS

## 2020-12-06 MED ORDER — ACETAMINOPHEN 325 MG PO TABS
325.0000 mg | ORAL_TABLET | ORAL | Status: DC | PRN
Start: 2020-12-06 — End: 2020-12-07

## 2020-12-06 MED ORDER — ONDANSETRON HCL 4 MG/2ML IJ SOLN: 4.0000 mg | Freq: Four times a day (QID) | INTRAMUSCULAR | Status: DC | PRN

## 2020-12-06 MED ORDER — FENTANYL CITRATE (PF) 100 MCG/2ML IJ SOLN
INTRAMUSCULAR | Status: AC
  Filled 2020-12-06: qty 2

## 2020-12-06 MED ORDER — CHLORHEXIDINE GLUCONATE 4 % EX LIQD
CUTANEOUS | Status: AC
  Filled 2020-12-06: qty 15

## 2020-12-06 MED ORDER — HEPARIN (PORCINE) IN NACL 1000-0.9 UT/500ML-% IV SOLN
INTRAVENOUS | Status: DC | PRN
  Administered 2020-12-06: 500 mL

## 2020-12-06 SURGICAL SUPPLY — 14 items
CABLE SURGICAL S-101-97-12 (CABLE) ×2 IMPLANT
CATH ATTAIN COM SURV 6250V-EH (CATHETERS) ×1 IMPLANT
ICD GALLANT HFCRTD CDHFA500Q (ICD Generator) ×1 IMPLANT
LEAD DURATA 7122Q-65CM (Lead) ×1 IMPLANT
LEAD QUARTET 1458QL-86 (Lead) IMPLANT
LEAD TENDRIL MRI 52CM LPA1200M (Lead) ×1 IMPLANT
PAD PRO RADIOLUCENT 2001M-C (PAD) ×2 IMPLANT
QUARTET 1458QL-86 (Lead) ×2 IMPLANT
SHEATH 8FR PRELUDE SNAP 13 (SHEATH) ×2 IMPLANT
SHEATH 9.5FR PRELUDE SNAP 13 (SHEATH) ×1 IMPLANT
SLITTER 6232ADJ (MISCELLANEOUS) ×1 IMPLANT
TRAY PACEMAKER INSERTION (PACKS) ×2 IMPLANT
WIRE ACUITY WHISPER EDS 4648 (WIRE) ×1 IMPLANT
WIRE HI TORQ VERSACORE-J 145CM (WIRE) ×1 IMPLANT

## 2020-12-06 NOTE — Consult Note (Signed)
   [facility] Inpatient Consult   12/06/2020  [name] April 11, 1946 [id number]  [facility] [ACO] Patient: Medicare CMS DCE  Patient is currently active with [facility] for chronic disease management services.  Patient has been engaged by a [place] Management Coordinator  Our community based plan of care has focused on disease management and community resource support.    Primary Care Provider is listed as Tysinger, Camelia Eng, PA-C however patient is also noted to have follow up at the [facility] in [place] with patient at the bedside who states if I get care it will be at the [place] in [place] or with the Heart Failure doctors, I don't know Tysinger.  Patient will receive a post hospital call and will be evaluated for assessments and disease process education.    Plan:  Spoke with Inpatient Transition Of Care [TOC] team member to make aware that [facility] following. Will update [facility] RN CM of disposition and needs.   Of note, [facility] Care Management services does not replace or interfere with any services that are needed or arranged by inpatient [facility] care management team.  For additional questions or referrals please contact:  Natividad Brood, RN BSN [facility] Liaison  [phone] business mobile phone Toll free office [phone]  Fax number: [phone] [email or web address]

## 2020-12-06 NOTE — Progress Notes (Signed)
Progress Note  Patient Name: [name] Date of Encounter: 12/06/2020  [facility] Cardiologist: Dr. Percival Spanish  Subjective   No SOB this AM, no CP or new complaints  Inpatient Medications    Scheduled Meds: . atorvastatin  40 mg Oral q1800  . cholecalciferol  1,000 Units Oral BID  . dofetilide  125 mcg Oral BID  . folic acid  1 mg Oral Daily  . gentamicin irrigation  80 mg Irrigation On Call  . isosorbide mononitrate  15 mg Oral Daily  . mouth rinse  15 mL Mouth Rinse BID  . methotrexate  10 mg Oral Weekly  . metoprolol succinate  25 mg Oral BID  . potassium chloride  40 mEq Oral Once  . predniSONE  30 mg Oral Q breakfast  . sodium chloride flush  3 mL Intravenous Q12H   Continuous Infusions: . sodium chloride 50 mL/hr at 12/06/20 0626  . sodium chloride 250 mL (12/06/20 0552)  . sodium chloride    .  ceFAZolin (ANCEF) IV     PRN Meds: sodium chloride, acetaminophen, ondansetron (ZOFRAN) IV, sodium chloride flush, sodium chloride flush   Vital Signs    Vitals:   12/05/20 1520 12/05/20 1949 12/05/20 1953 12/06/20 0500  BP: 103/80 95/66 95/66  99/67  Pulse: 82 67 65 62  Resp: 16 20  18   Temp: 97.8 F (36.6 C) 97.6 F (36.4 C)  97.6 F (36.4 C)  TempSrc: Oral Oral  Oral  SpO2: 100% 95%  100%  Weight:    64.3 kg  Height:        Intake/Output Summary (Last 24 hours) at 12/06/2020 0820 Last data filed at 12/06/2020 0626 Gross per 24 hour  Intake 215 ml  Output 2850 ml  Net -2635 ml   Last 3 Weights 12/06/2020 12/05/2020 12/04/2020  Weight (lbs) 141 lb 12.8 oz 148 lb 153 lb 6.4 oz  Weight (kg) 64.32 kg 67.132 kg 69.582 kg      Telemetry    SR, 60's-80's 1st degree AVblock, occ PVCs, no VT since yesterday afternoon  - Personally Reviewed  ECG    SR 63bpm, RBBB, 1st degree AVBlock, LAD, reviewed with Dr. Quentin Ore, QT 520, QRS 115ms, QTc 492 - Personally Reviewed  Physical Exam   GEN: No acute distress.   Neck: np JVD Cardiac: RRR, no murmurs, rubs.   Respiratory: CTA b/l this AM. GI: Soft, nontender, non-distended  MS: No edema; No deformity. Neuro:  Nonfocal  Psych: Normal affect   Labs    High Sensitivity Troponin:  No results for input(s): TROPONINIHS in the last 720 hours.    Chemistry Recent Labs  Lab 12/03/20 1444 12/04/20 0210 12/04/20 1104 12/05/20 0322 12/05/20 1357 12/06/20 0351  NA 143   < > 138 137 139  143 132*  K 3.8   < > 4.0 4.4 3.5  3.0* 3.8  CL 108   < > 104 98  --  98  CO2 29   < > 25 31  --  25  GLUCOSE 105*   < > 154* 127*  --  129*  BUN 29*   < > 30* 38*  --  45*  CREATININE 1.94*   < > 1.84* 2.08*  --  2.06*  CALCIUM 8.9   < > 9.2 9.3  --  9.1  PROT 6.3*  --   --   --   --   --   ALBUMIN 3.2*  --   --   --   --   --  AST 29  --   --   --   --   --   ALT 41  --   --   --   --   --   ALKPHOS 71  --   --   --   --   --   BILITOT 1.5*  --   --   --   --   --   GFRNONAA 36*   < > 38* 33*  --  33*  ANIONGAP 6   < > 9 8  --  9   < > = values in this interval not displayed.     Hematology Recent Labs  Lab 12/03/20 1444 12/05/20 1357  WBC 8.3  --   RBC 3.84*  --   HGB 13.3 16.7  16.0  HCT 40.2 49.0  47.0  MCV 104.7*  --   MCH 34.6*  --   MCHC 33.1  --   RDW 18.2*  --   PLT 99*  --     BNP Recent Labs  Lab 12/03/20 1444  BNP >4,500.0*     DDimer No results for input(s): DDIMER in the last 168 hours.   Radiology    CARDIAC CATHETERIZATION  Result Date: 12/05/2020 Findings: RA = 1 RV = 31/2 PA = 30/15 (20) PCW = 13 Fick cardiac output/index = 3.9/2.1 PVR = 1.9 WU Ao sat = 98% PA sat =62%, 64% Assessment: 1. Low filling pressures 2. Moderately reduced CO Plan/Discussion: Stop diuretics. Glori Bickers, MD 2:07 PM    Cardiac Studies   March 2022, 2 week monitor 1. Sinus Rhythm. First Degree AV Block was present - avg HR of 80 bpm. 2. Bundle Branch Block/IVCD was present.  3. 43 runs of nonsustained ventricular tachycardia occurred, the run with the  fastest interval  lasting 6 beats with a max rate of 214 bpm, the longest lasting 14 beats with an avg rate of 162 bpm.  4. Atrial Fibrillation occurred (7% burden), ranging from 75-186 bpm (avg of 135 bpm), the longest lasting 11 hours 8 mins with an avg rate of 139 bpm.  5. Rare PACs 6. Occasional PVCs (3.4%, 96789)    10/21/20; PET/CT Impression:  1. Findings are suggestive of inflammatory process involving predominantly right atrium, 5/17 segments of the left ventricle and  to a lesser degree left atrium.  2. Coronary artery calcifications and aortic calcifications.  3. No evidence of metabolically active sarcoid outside of the heart.    RHC/LHC 07/2020 -moderate non-obstructive CAD Echo 11/21 LVEF 20-25%, G2DD, diffuse HK, moderately reduced RV CMRI 07/2020 - LVEF 24%These findings are consistent with non-ischemic cardiomyopathy and are highly suspicious for diffuse cardiac sarcoidosis. ECV is very elevated at 45% (amyloid range, however no other findings suggestive of cardiac amyloidosis), however most probably represent diffuse scarring.    07/01/2020: TTE IMPRESSIONS  1. Left ventricular ejection fraction, by estimation, is 20 to 25%. The  left ventricle has severely decreased function. The left ventricle  demonstrates global hypokinesis. The left ventricular internal cavity size  was moderately dilated. Left  ventricular diastolic parameters were normal.  2. Right ventricular systolic function is moderately reduced. The right  ventricular size is moderately enlarged. There is moderately elevated  pulmonary artery systolic pressure.  3. Left atrial size was mildly dilated.  4. Degree of functional MR worse compared to echo done July 2019. The  mitral valve is normal in structure. Moderate mitral valve regurgitation.  No evidence of mitral stenosis.  5. The aortic valve is normal in structure. Aortic valve regurgitation is  mild. No aortic stenosis is present.  6. The  inferior vena cava is dilated in size with <50% respiratory  variability, suggesting right atrial pressure of 15 mmHg.   Patient Profile     75 y.o. male with PMHx of chronic CHF (combined), NICM, also mod reduced RVEF, HTN, CKD (III), former smoker (chest CT 11/21 showing bilateral pulmonary nodules and mild mediastinal and hilar adenopathy), PAD )s/p bifem bypass and aorto-iliac aneursym repair), AFib, PVCs, NSVT, RBBB (trifascicular block), cardiac sarcoid admitted with AFib RVR and acute/chronic CHF exacerbation  Assessment & Plan    1. Paroxysmal AFib     CHA2DS2Vasc is 4, on Eliquis appropriately dosed, though was not taking it     Eliquis started here > held for RHC today and ICD implant tomorrow  He had spontaneous conversion to SR Dr.Lambert has seen and examined the patient this morning We have reviewed labs and EKG together K+ 3.8, replaced Mag 2.1 Creat 2.06  QTc lengthened out some yesterday and dose reduced, stable/acceptable given wide RBBB this AM  The patient gets his medicines through the [facility] case management assist [facility] is open Sat 800 AM- 6:30PM Patient initially did not think he could get there to pick up the drug Sat Dr. Quentin Ore discussed importance of not missing any doses and that if he could not get there Saturday he would not be able to leave until Monday or pay out of pocket whatever his copay would be for a fill until he could. The patient said that he would with family help be able to get the medicine Saturday and understood the importance. I have sent an E prescription to his [facility] for Dofetilide 165mcg BID IF for some reason he does NOT discharge on drug we will need to cancel this [facility]  [facility]       2. NICM 3. Acute/chronic CHF  4. Cardiacsarcoid     BiVe failure     HF team is on board      Dr. Quentin Ore has seen and examined the patient this morning, will implant tomorrow, he re-discussed  plan for ICD today, pt remains agreeable  4. Cardiac sarcoid (active) 5. PVCs, NSVT noted on out pt holter at baseline     appreciate HF team contnued prednisone at the 30mg  dosing  NO bactrim given Tikosyn D/w pharmacy they are looking into perhaps Dapsone Methotrexate started C/w AHF team      6. HTN     relative hypotension with meds, looks ok   7. CKD      follow, about the same today     Diuretics held post [place]    For questions or updates, please contact [place] Please consult [email or web address] for contact info under        Signed, Baldwin Jamaica, PA-C  12/06/2020, 8:20 AM   As above.

## 2020-12-06 NOTE — Progress Notes (Signed)
Rep for ICD device Gaspar Bidding) in to check device. Dr. Jeffie Pollock in to see patient.

## 2020-12-06 NOTE — Progress Notes (Addendum)
Advanced Heart Failure Rounding Note  PCP-Cardiologist: No primary care provider on file.   Subjective:    Admitted with A fib RVR and volume overload.    Back in NSR and placed on Tikosyn but EP stopping due to prolonged QT.   Underwent BiV ICD implant today. Feeling ok post implant. No complaints currently. A-paced. HR 70s. No dspnea.    Creatinine 1.9-> 2.1->2.1   RHC yesterday looked ok   RA 1 RV 31/2 PA 30/15 (20) PCW 13 Fick 3.9/2.1  Objective:   Weight Range: 64.3 kg Body mass index is 20.06 kg/m.   Vital Signs:   Temp:  [97.6 F (36.4 C)] 97.6 F (36.4 C) (04/15 0500) Pulse Rate:  [0-276] 70 (04/15 1530) Resp:  [0-30] 17 (04/15 1530) BP: (54-129)/(36-90) 113/77 (04/15 1530) SpO2:  [0 %-100 %] 96 % (04/15 1530) Weight:  [64.3 kg] 64.3 kg (04/15 0500) Last BM Date: 12/04/20  Weight change: Filed Weights   12/04/20 0300 12/05/20 0506 12/06/20 0500  Weight: 69.6 kg 67.1 kg 64.3 kg    Intake/Output:   Intake/Output Summary (Last 24 hours) at 12/06/2020 1543 Last data filed at 12/06/2020 0626 Gross per 24 hour  Intake 147.83 ml  Output 375 ml  Net -227.17 ml      Physical Exam    PHYSICAL EXAM: General:  Well appearing. No respiratory difficulty HEENT: normal Neck: supple. no JVD. Carotids 2+ bilat; no bruits. No lymphadenopathy or thyromegaly appreciated. Cor: PMI nondisplaced. Regular rate & rhythm. No rubs, gallops or murmurs. Lungs: clear Abdomen: soft, nontender, nondistended. No hepatosplenomegaly. No bruits or masses. Good bowel sounds. Extremities: no cyanosis, clubbing, rash, edema Neuro: alert & oriented x 3, cranial nerves grossly intact. moves all 4 extremities w/o difficulty. Affect pleasant.   Telemetry   A-paced 70s  Personally reviewed  Labs    CBC Recent Labs    12/05/20 0322 12/05/20 1357  HGB 14.0 16.7  16.0  HCT  --  49.0  24.2   Basic Metabolic Panel Recent Labs    12/05/20 0322 12/05/20 1357  12/06/20 0351  NA 137 139  143 132*  K 4.4 3.5  3.0* 3.8  CL 98  --  98  CO2 31  --  25  GLUCOSE 127*  --  129*  BUN 38*  --  45*  CREATININE 2.08*  --  2.06*  CALCIUM 9.3  --  9.1  MG 2.2  --  2.1   Liver Function Tests No results for input(s): AST, ALT, ALKPHOS, BILITOT, PROT, ALBUMIN in the last 72 hours. No results for input(s): LIPASE, AMYLASE in the last 72 hours. Cardiac Enzymes No results for input(s): CKTOTAL, CKMB, CKMBINDEX, TROPONINI in the last 72 hours.  BNP: BNP (last 3 results) Recent Labs    06/30/20 2354 08/06/20 0941 12/03/20 1444  BNP 3,205.6* >4,500.0* >4,500.0*    ProBNP (last 3 results) No results for input(s): PROBNP in the last 8760 hours.   D-Dimer No results for input(s): DDIMER in the last 72 hours. Hemoglobin A1C No results for input(s): HGBA1C in the last 72 hours. Fasting Lipid Panel No results for input(s): CHOL, HDL, LDLCALC, TRIG, CHOLHDL, LDLDIRECT in the last 72 hours. Thyroid Function Tests No results for input(s): TSH, T4TOTAL, T3FREE, THYROIDAB in the last 72 hours.  Invalid input(s): FREET3  Other results:   Imaging    No results found.   Medications:     Scheduled Medications: . [START ON 12/07/2020] amiodarone  200 mg  Oral BID  . [MAR Hold] atorvastatin  40 mg Oral q1800  . [MAR Hold] cholecalciferol  1,000 Units Oral BID  . [MAR Hold] folic acid  1 mg Oral Daily  . [MAR Hold] isosorbide mononitrate  15 mg Oral Daily  . [MAR Hold] mouth rinse  15 mL Mouth Rinse BID  . [MAR Hold] methotrexate  10 mg Oral Weekly  . [MAR Hold] metoprolol succinate  25 mg Oral BID  . [MAR Hold] predniSONE  30 mg Oral Q breakfast  . [MAR Hold] sodium chloride flush  3 mL Intravenous Q12H  . [START ON 12/09/2020] sulfamethoxazole-trimethoprim  1 tablet Oral Once per day on Mon Wed Fri    Infusions: . sodium chloride 50 mL/hr at 12/06/20 0626  . sodium chloride 250 mL (12/06/20 0552)  . [MAR Hold] sodium chloride      PRN  Medications: [MAR Hold] sodium chloride, [MAR Hold] acetaminophen, [MAR Hold] ondansetron (ZOFRAN) IV, sodium chloride flush, [MAR Hold] sodium chloride flush     Assessment/Plan   1. A fib RVR - Was set up for direct admit and Tikosyn, per EP.  - QT prolonged, per EP stop Tikosyn. Start Amio tomorrow  - Eliquis on hold given ICD implant - Continue Toprol XL at current dose.   2. Cardiac Sarcoid - Followed by Dr Marcene Bra[facility] at [facility].  -07/2020 CMRI suggestive sarcoid--> Cardiac Pet--> Cardiac Sarcoid -Referred to Dr Marcene Brawn at [facility] and was evaluated 3/2 with plans to continue prednisone and repeat Pet after 3 months. At that point consider methotrexate.  - Supposed to be on prednisione 30 mg daily but has only been on 10 mg daily.  - Continue prednisone 30mg  daily. MTX added 4/13 - Continue bactrim 1 tab M-W-F  - S/p CRT-D 4/15  3. Acute/ Chronic Combined HFrEF indicate nonobstructive CAD on cath in 2007, study report not on file) - Most recent echo 11/21 LVEF 20-25%, G2DD, diffuse HK, moderately reduced RV -cMRI suggestive of sarcoid. EF 24% - Cardiac Pet as noted above with cardiac sarcoid - Presenting with NYHA IV symptoms. Improved today.  - Volume status much improved - Lasix on hold  - Cardiac output margimal - Continue Toprol XL as noted above - Hold entresto/spiro for now with elevated creatinine.   4. CKD Stage IIIa - Creatinine  Baseline 08-27-13. - Creatinine trending 1.9 -> 2.1->2.1  - Volume status low. Continue to hold lasix   5. PVCs/NSVT  - plan to start amio per EP  - S/p  CRT-D   6. PAD  -S/P Bifem BPG and aortoiliac aneurysm repair - Continue statin.    Length of Stay: [address], [place]ia  12/06/2020, 3:43 PM  Advanced Heart Failure Team Pager [phone] (M-F; 7a - 5p)  Please contact [facility] for night-coverage after hours (5p -7a ) and weekends on [email or web address]  Patient seen and examined with the above-signed Advanced Practice Provider  and/or Housestaff. I personally reviewed laboratory data, imaging studies and relevant notes. I independently examined the patient and formulated the important aspects of the plan. I have edited the note to reflect any of my changes or salient points. I have personally discussed the plan with the patient and/or family.  He is s/p ICD implant. Developed profound hypotension during procedure - suspect volume depletion. Now improved with fluid resuscitation. Feels ok. Rhythm stable  General:  Well appearing. No resp difficulty HEENT: normal Neck: supple. no JVD. Carotids 2+ bilat; no bruits. No lymphadenopathy or thryomegaly appreciated. Cor: PMI  nondisplaced. Regular rate & rhythm. No rubs, gallops or murmurs. ICD site ok  Lungs: clear Abdomen: soft, nontender, nondistended. No hepatosplenomegaly. No bruits or masses. Good bowel sounds. Extremities: no cyanosis, clubbing, rash, edema Neuro: alert & orientedx3, cranial nerves grossly intact. moves all 4 extremities w/o difficulty. Affect pleasant  He is s/p ICD implant c/b hypotension. Improved with volume resuscitation. Will follow overnight. Rhythm stable. Continue MTX/prednisone for sarcoid. Failed Tikosyn with QT prolongation. Now on amio.  Glori Bickers, MD  5:25 PM

## 2020-12-06 NOTE — TOC Progression Note (Signed)
Transition of Care (TOC) - Progression Note  Heart Failure  Patient Details  Name: [name] MRN: [id number] Date of Birth: [birth year]  Transition of Care [facility]) CM/SW [place], [place] Phone Number: 12/06/2020, 10:31 AM  Clinical Narrative:    CSW spoke with the patient at bedside and informed him that the Food Stamp referral has been sent and they should reach out to him soon. CSW informed [name] of his primary care appointment scheduled with [facility] for 01/02/21 at 11am. Patient reported that he believes he has other appointments scheduled around that time but isn't sure and may need to move the appointment.  CSW reached out to [facility] however they are closed today for Good Friday. CSW informed [name] of this and to try on Monday.  TOC will continue to follow through d/c.     Barriers to Discharge: Continued Medical Work up  Expected Discharge Plan and Services   In-house Referral: Clinical Social Work Discharge Planning Services: CM Consult   Living arrangements for the past 2 months: Single Family Home                                       Social Determinants of Health (SDOH) Interventions Financial Strain Interventions: Intervention Not Indicated Housing Interventions: Intervention Not Indicated Transportation Interventions: Intervention Not Indicated  Readmission Risk Interventions No flowsheet data found.  Koren Plyler, MSW, [place] Heart Failure Social Worker

## 2020-12-06 NOTE — Progress Notes (Signed)
Patient's post dose EKG noted QT prolongation and in review with Dr. Quentin Ore, not felt to be a safe drug for him The prescription for Tikosyn sent previously to the [facility] pharmacy was called and cancelled (and confirmed to me) by our pharmacist. "I cancelled the script with the [facility]. They sent his Eliquis March14th, they have delivery confirmation on March 19th to [address]"  We have stopped Tikosyn and will plan to pursue amiodarone tomorrow Amiodarone 200mg  BID for 2 weeks and then once daily  In much discussion today and much help with pharmacy Made aware that the [facility] will not accept a non-VA provider Rx to fill by them (despite their discussion with case manager yesterday)   [facility]  Will fill Eliquis 30 day supply free They will fill his   Methotrexate, Bactrim (since he won't be on tikosyn) as well as the amiodarone and will be locked in the main pharmacy for the patient to be available at time of discharge should he go home the weekend.  If there is any changes, these medicines will need to be addressed  The patient went down for his procedure and is now s/p sedation to let him know he will need to communicate his medication/new prescriptions will need to be brought to the [place] if that is where he needs them to be filled to get refills before running out of his supply   Tommye Standard, PA-C

## 2020-12-06 NOTE — Progress Notes (Signed)
Reported to this writer that pt had a 12 beat run of vtach. Pt asymptomatic. No c/o pain or SOB. PA notified.

## 2020-12-07 ENCOUNTER — Inpatient Hospital Stay (HOSPITAL_COMMUNITY): Payer: Medicare Other

## 2020-12-07 LAB — BASIC METABOLIC PANEL
Anion gap: 11 (ref 5–15)
BUN: 44 mg/dL — ABNORMAL HIGH (ref 8–23)
CO2: 23 mmol/L (ref 22–32)
Calcium: 8.9 mg/dL (ref 8.9–10.3)
Chloride: 102 mmol/L (ref 98–111)
Creatinine, Ser: 1.8 mg/dL — ABNORMAL HIGH (ref 0.61–1.24)
GFR, Estimated: 39 mL/min — ABNORMAL LOW (ref >60)
Glucose, Bld: 127 mg/dL — ABNORMAL HIGH (ref 70–99)
Potassium: 4.7 mmol/L (ref 3.5–5.1)
Sodium: 136 mmol/L (ref 135–145)

## 2020-12-07 LAB — MAGNESIUM: Magnesium: 1.9 mg/dL (ref 1.7–2.4)

## 2020-12-07 MED ORDER — APIXABAN 5 MG PO TABS
5.0000 mg | ORAL_TABLET | Freq: Two times a day (BID) | ORAL | 3 refills | Status: DC
  Filled 2020-12-07: qty 180, 90d supply, fill #0

## 2020-12-07 NOTE — Progress Notes (Signed)
Advanced Heart Failure Rounding Note  PCP-Cardiologist: Minus Breeding, MD   Subjective:    Remains in NSR on po amio.   S/p ICD implant yesterday. Sore at site  BP now stabilized. No COB, orthopnea or PND.  Wants to go home.   Objective:   Weight Range: 65.3 kg Body mass index is 20.37 kg/m.   Vital Signs:   Temp:  [97.5 F (36.4 C)-98.7 F (37.1 C)] 98.7 F (37.1 C) (04/16 0836) Pulse Rate:  [0-276] 73 (04/16 0836) Resp:  [0-30] 16 (04/16 0836) BP: (54-129)/(36-90) 110/81 (04/16 0836) SpO2:  [0 %-100 %] 100 % (04/16 0836) Weight:  [65.3 kg] 65.3 kg (04/16 0352) Last BM Date: 12/06/20  Weight change: Filed Weights   12/05/20 0506 12/06/20 0500 12/07/20 0352  Weight: 67.1 kg 64.3 kg 65.3 kg    Intake/Output:  No intake or output data in the 24 hours ending 12/07/20 1212    Physical Exam    General:  Well appearing. No resp difficulty HEENT: normal Neck: supple. no JVD. Carotids 2+ bilat; no bruits. No lymphadenopathy or thryomegaly appreciated. Cor: PMI nondisplaced. Regular rate & rhythm. No rubs, gallops or murmurs. ICD site ok  Lungs: clear Abdomen: soft, nontender, nondistended. No hepatosplenomegaly. No bruits or masses. Good bowel sounds. Extremities: no cyanosis, clubbing, rash, edema Neuro: alert & orientedx3, cranial nerves grossly intact. moves all 4 extremities w/o difficulty. Affect pleasant  Telemetry   NSR Personally reviewed  Labs    CBC Recent Labs    12/05/20 0322 12/05/20 1357  HGB 14.0 16.7  16.0  HCT  --  49.0  14.7   Basic Metabolic Panel Recent Labs    12/06/20 0351 12/07/20 0144  NA 132* 136  K 3.8 4.7  CL 98 102  CO2 25 23  GLUCOSE 129* 127*  BUN 45* 44*  CREATININE 2.06* 1.80*  CALCIUM 9.1 8.9  MG 2.1 1.9   Liver Function Tests No results for input(s): AST, ALT, ALKPHOS, BILITOT, PROT, ALBUMIN in the last 72 hours. No results for input(s): LIPASE, AMYLASE in the last 72 hours. Cardiac Enzymes No  results for input(s): CKTOTAL, CKMB, CKMBINDEX, TROPONINI in the last 72 hours.  BNP: BNP (last 3 results) Recent Labs    06/30/20 2354 08/06/20 0941 12/03/20 1444  BNP 3,205.6* >4,500.0* >4,500.0*    ProBNP (last 3 results) No results for input(s): PROBNP in the last 8760 hours.   D-Dimer No results for input(s): DDIMER in the last 72 hours. Hemoglobin A1C No results for input(s): HGBA1C in the last 72 hours. Fasting Lipid Panel No results for input(s): CHOL, HDL, LDLCALC, TRIG, CHOLHDL, LDLDIRECT in the last 72 hours. Thyroid Function Tests No results for input(s): TSH, T4TOTAL, T3FREE, THYROIDAB in the last 72 hours.  Invalid input(s): FREET3  Other results:   Imaging    DG Chest 2 View  Result Date: 12/07/2020 CLINICAL DATA:  Post ICD EXAM: CHEST - 2 VIEW COMPARISON:  Chest x-ray dated 12/03/2020. FINDINGS: New LEFT chest wall ICD hardware in place. No pleural effusion or pneumothorax is seen. No confluent opacity to suggest a developing pneumonia. No evidence of pulmonary edema. Stable cardiomegaly. No acute appearing osseous abnormality. IMPRESSION: New ICD hardware in place. No pneumothorax or evidence of other procedural complicating feature. Electronically Signed   By: Franki Cabot M.D.   On: 12/07/2020 07:48     Medications:     Scheduled Medications: . amiodarone  200 mg Oral BID  . atorvastatin  40  mg Oral q1800  . cholecalciferol  1,000 Units Oral BID  . folic acid  1 mg Oral Daily  . isosorbide mononitrate  15 mg Oral Daily  . mouth rinse  15 mL Mouth Rinse BID  . methotrexate  10 mg Oral Weekly  . metoprolol succinate  25 mg Oral BID  . predniSONE  30 mg Oral Q breakfast  . sodium chloride flush  3 mL Intravenous Q12H  . [START ON 12/09/2020] sulfamethoxazole-trimethoprim  1 tablet Oral Once per day on Mon Wed Fri    Infusions: . sodium chloride      PRN Medications: sodium chloride, acetaminophen, acetaminophen, ondansetron (ZOFRAN) IV,  sodium chloride flush     Assessment/Plan   1. A fib RVR - Failed tikosyn. No in SNR on po amio  - Continue Toprol XL at current dose. - EP managing  - start Eliquis back on 4/18    2. Cardiac Sarcoid - Followed by Dr Marcene Brawn at [facility]  -07/2020 CMRI suggestive sarcoid--> Cardiac Pet--> Cardiac Sarcoid -Referred to Dr Marcene Brawn at [facility] and was evaluated 3/2 with plans to continue prednisone and repeat Pet after 3 months. At that point consider methotrexate.  - Supposed to be on prednisione 30 mg daily but has only been on 10 mg daily.  - Continue prednisone 30mg  daily. MTX added 4/13 - Continue bactrim 1 tab M-W-F  - S/p CRT-D 4/15 - continue plan  3. Acute/ Chronic Combined HFrEF indicate nonobstructive CAD on cath in 2007, study report not on file) - Most recent echo 11/21 LVEF 20-25%, G2DD, diffuse HK, moderately reduced RV -cMRI suggestive of sarcoid. EF 24% - Cardiac Pet as noted above with cardiac sarcoid - Presenting with NYHA IV symptoms. Improved today.  - Volume status much improved - Cardiac output margimal - Continue Toprol XL as noted above - resume spiro and Farxiga - Hold entresto with low BP. Restart as outpatient    4. CKD Stage IIIa - Creatinine  Baseline 1.4-15. - Creatinine trending 1.9 -> 2.1->2.1  -> 1.8 - meds as above.   5. PVCs/NSVT  - on amio per EP  - S/p  CRT-D   6. PAD  -S/P Bifem BPG and aortoiliac aneurysm repair - Continue statin.   [place] for d/c. F/u in HF and EP clincs  Length of Stay: [place], [place]  12/07/2020, 12:12 PM  Advanced Heart Failure Team Pager [phone] (M-F; 7a - 5p)  Please contact [facility] for night-coverage after hours (5p -7a ) and weekends on [email or web address]

## 2020-12-07 NOTE — Discharge Summary (Addendum)
Discharge Summary    Patient ID: [name] MRN: [id number]; DOB: 06/30/1946  Admit date: 12/03/2020 Discharge date: 12/07/2020  PCP:  Linus Mako, NP   [place]  Cardiologist:  Minus Breeding, MD  Electrophysiologist:  Virl Axe, MD  Advanced Heart Failure Clinic:  Dr. Haroldine Laws    Discharge Diagnoses    Principal Problem:   Atrial fibrillation [facility]) Active Problems:   TOBACCO ABUSE   Essential hypertension   Coronary atherosclerosis   Monomorphic ventricular tachycardia (HCC)   HFrEF (heart failure with reduced ejection fraction) (HCC)   Peripheral arterial occlusive disease (HCC)   Cardiomyopathy ([place])   Heart block   Bradycardia   Dyslipidemia   Chronic kidney disease, stage 3a ([place])   Acute on chronic systolic (congestive) heart failure ([place])   Type 2 diabetes mellitus [facility])    Diagnostic Studies/Procedures    March 2022, 2 week monitor 1. Sinus Rhythm. First Degree AV Block was present - avg HR of 80 bpm. 2. Bundle Branch Block/IVCD was present.  3. 43 runs of nonsustained ventricular tachycardia occurred, the run with the  fastest interval lasting 6 beats with a max rate of 214 bpm, the longest lasting 14 beats with an avg rate of 162 bpm.  4. Atrial Fibrillation occurred (7% burden), ranging from 75-186 bpm (avg of 135 bpm), the longest lasting 11 hours 8 mins with an avg rate of 139 bpm.  5. Rare PACs 6. Occasional PVCs (3.4%, 49702)       10/21/20; PET/CT Impression:   1. Findings are suggestive of inflammatory process involving predominantly right atrium, 5/17 segments of the left ventricle and   to a lesser degree left atrium.   2. Coronary artery calcifications and aortic calcifications.   3. No evidence of metabolically active sarcoid outside of the heart.      RHC/LHC 07/2020 -moderate non-obstructive CAD Echo 11/21 LVEF 20-25%, G2DD, diffuse HK, moderately reduced RV CMRI 07/2020  - LV EF 24% These  findings are consistent with non-ischemic cardiomyopathy and are highly suspicious for diffuse cardiac sarcoidosis. ECV is very elevated at 45% (amyloid range, however no other findings suggestive of cardiac amyloidosis), however most probably represent diffuse scarring.      07/01/2020: TTE IMPRESSIONS   1. Left ventricular ejection fraction, by estimation, is 20 to 25%. The  left ventricle has severely decreased function. The left ventricle  demonstrates global hypokinesis. The left ventricular internal cavity size  was moderately dilated. Left  ventricular diastolic parameters were normal.   2. Right ventricular systolic function is moderately reduced. The right  ventricular size is moderately enlarged. There is moderately elevated  pulmonary artery systolic pressure.   3. Left atrial size was mildly dilated.   4. Degree of functional MR worse compared to echo done July 2019. The  mitral valve is normal in structure. Moderate mitral valve regurgitation.  No evidence of mitral stenosis.   5. The aortic valve is normal in structure. Aortic valve regurgitation is  mild. No aortic stenosis is present.   6. The inferior vena cava is dilated in size with <50% respiratory  variability, suggesting right atrial pressure of 15 mmHg.  _____________   History of Present Illness     [name] is a 75 y.o. male with chronic combined heart failure with RV failure, NICM with nonobstructive CAD, Afib on eliquis, and NSVT, CKD stage III, PAD s/p bifem bypass, aorto-iliac aneurysm repair, tobacco use (quite smoking 2021), and bilateral pulmonary nodules and  mild mediastinal and hilar adenopathy.    He was admitted in Dec 2021 and HF medications adjusted to GDMT. Heart cath with mild nonobstructive disease, echo with EF 20-25% wit moderate RV dysfunction and moderate MR. cMRI suggestive of diffuse cardaic sarcoid, confirmed with PET - followed by [facility]. Taking prednisone, methotrexate, and bactrim.  Dry weight thought to be near 139 lbs.   He was seen by Dr. [name] for Afib and scheduled for TEE-guided DCCV, but presented and was in NSR. Heart monitor did show a 7% Afib burden, but 43 episodes of NSVT. Of note, he never started eliquis. He was seen 12/02/20 by Dr. [name] and was back in Afib. Pt suspected he had been in Afib for about a week and had SOB, He was direct admitted for tikosyn. Noted to only be taking prednisone 10 mg instead of 30 mg due to concern about weight gain.    Hospital Course     Consultants: EP, CHF  Afib RVR Was direct admitted for tikosyn, but tikosyn D/C'ed due to prolonged QT. Amiodarone was started - 200 mg BID x 2 weeks. Was given 2 weeks worth by [facility] pharmacy.   Chronic anticoagulation Eliquis was held for ICD implant - restart on 12/09/20 - delivered by [place].   BiV ICD placed 12/06/20 - BiV ICD with CRT-D  - heart monitor showed 43 runs of NSVT, 7% Afib burden   Cardiac sarcoidosis Followed by Dr. Marcene Brawn at [place] bactrim MWF Continue prednisone 30 mg (was only taking 10 mg) and methotrexate added 4/13 - bactrim and methotrexate provided by [facility] pharmacy for 2 weeks. He has prednisone at home.    Acute on chronic combined heart failure Nonischemic cardiomyopathy Echo with EF 20-25%, moderately reduced RV function, moderate MR,  (06/2020) Right heart cath 12/05/20 with low filling pressures and moderately reduced CO - recommended D/C diuretics Spironolactone and entresto were held, 20 mg lasix has been held. Wilder Glade is on his home med list - unclear if he is taking this, but would be beneficial. Will hold imdur and entresto for now, restart spironolactone and continue BB and farxiga.    Moderate nonobstructive CAD Heart cath 07/2020 Continue ASA, toprol and 40 mg lipitor.   CKD stage III Baseline sCr appears to be 1.3-1.8 Creatinine trended up to 2.08 --> now 1.80 Spriro and entresto were held - restart when able in OP  setting.   DM A1c 6.1% Continue farxiga.   Pt seen and examined by Dr. Lovena Le and deemed stable for discharge.   Did the patient have an acute coronary syndrome (MI, NSTEMI, STEMI, etc) this admission?:  No                               Did the patient have a percutaneous coronary intervention (stent / angioplasty)?:  No.       _____________  Discharge Vitals Blood pressure 110/81, pulse 73, temperature 98.7 F (37.1 C), temperature source Oral, resp. rate 16, height 5' 10.5" (1.791 m), weight 65.3 kg, SpO2 100 %.  Filed Weights   12/05/20 0506 12/06/20 0500 12/07/20 0352  Weight: 67.1 kg 64.3 kg 65.3 kg    Labs & Radiologic Studies    CBC Recent Labs    12/05/20 0322 12/05/20 1357  HGB 14.0 16.7  16.0  HCT  --  49.0  43.3   Basic Metabolic Panel Recent Labs    12/06/20 0351 12/07/20 0144  NA 132* 136  K 3.8 4.7  CL 98 102  CO2 25 23  GLUCOSE 129* 127*  BUN 45* 44*  CREATININE 2.06* 1.80*  CALCIUM 9.1 8.9  MG 2.1 1.9   Liver Function Tests No results for input(s): AST, ALT, ALKPHOS, BILITOT, PROT, ALBUMIN in the last 72 hours. No results for input(s): LIPASE, AMYLASE in the last 72 hours. High Sensitivity Troponin:   No results for input(s): TROPONINIHS in the last 720 hours.  BNP Invalid input(s): POCBNP D-Dimer No results for input(s): DDIMER in the last 72 hours. Hemoglobin A1C No results for input(s): HGBA1C in the last 72 hours. Fasting Lipid Panel No results for input(s): CHOL, HDL, LDLCALC, TRIG, CHOLHDL, LDLDIRECT in the last 72 hours. Thyroid Function Tests No results for input(s): TSH, T4TOTAL, T3FREE, THYROIDAB in the last 72 hours.  Invalid input(s): FREET3 _____________  DG Chest 2 View  Result Date: 12/07/2020 CLINICAL DATA:  Post ICD EXAM: CHEST - 2 VIEW COMPARISON:  Chest x-ray dated 12/03/2020. FINDINGS: New LEFT chest wall ICD hardware in place. No pleural effusion or pneumothorax is seen. No confluent opacity to suggest a  developing pneumonia. No evidence of pulmonary edema. Stable cardiomegaly. No acute appearing osseous abnormality. IMPRESSION: New ICD hardware in place. No pneumothorax or evidence of other procedural complicating feature. Electronically Signed   By: Franki Cabot M.D.   On: 12/07/2020 07:48   CARDIAC CATHETERIZATION  Result Date: 12/05/2020 Findings: RA = 1 RV = 31/2 PA = 30/15 (20) PCW = 13 Fick cardiac output/index = 3.9/2.1 PVR = 1.9 WU Ao sat = 98% PA sat =62%, 64% Assessment: 1. Low filling pressures 2. Moderately reduced CO Plan/Discussion: Stop diuretics. Glori Bickers, MD 2:07 PM   DG Chest Port 1V same Day  Result Date: 12/03/2020 CLINICAL DATA:  Chronic systolic heart failure EXAM: PORTABLE CHEST 1 VIEW COMPARISON:  August 06, 2020 FINDINGS: There is cardiomegaly with pulmonary vascularity normal. No adenopathy. No edema or airspace opacity. There is aortic atherosclerosis. There is an exostosis arising from the inferior aspect of the mid clavicle on the left. IMPRESSION: Cardiomegaly. No edema or airspace opacity. Pulmonary vascularity within normal limits. Aortic Atherosclerosis (ICD10-I70.0). Electronically Signed   By: Lowella Grip III M.D.   On: 12/03/2020 16:22   LONG TERM MONITOR (3-14 DAYS)  Result Date: 11/24/2020 Patch Wear Time:  14 days and 0 hours (2022-03-11T09:03:34-0500 to 2022-03-25T10:03:38-0400) 1. Sinus Rhythm. First Degree AV Block was present - avg HR of 80 bpm. 2. Bundle Branch Block/IVCD was present. 3. 43 runs of nonsustained ventricular tachycardia occurred, the run with the fastest interval lasting 6 beats with a max rate of 214 bpm, the longest lasting 14 beats with an avg rate of 162 bpm. 4. Atrial Fibrillation occurred (7% burden), ranging from 75-186 bpm (avg of 135 bpm), the longest lasting 11 hours 8 mins with an avg rate of 139 bpm. 5. Rare PACs 6. Occasional PVCs (3.4%, [id number]) Glori Bickers, MD 10:37 PM  Disposition   Pt is being discharged  home today in good condition.  Follow-up Plans & Appointments     Follow-up Information     [facility] Follow up.   Why: Primary Care Appointment May 12th, 2022 Thursday at [facility] information: [place] Follow up.   Specialty: Cardiology Why: 12/13/20 @ 8:30AM with Maximino Greenland., NP, to follow up in Tikosyn/dofetilide medicine Contact  information: [address] [id number] [place] [phone]        [place] [facility] Follow up.   Specialty: Cardiology Why: 12/17/20 @ 4:00PM, wound check visit (defibrillator) Contact information: [address] [place]        Deboraha Sprang, MD Follow up.   Specialty: Cardiology Why: 01/08/21 @ 11:30AM Contact information: [address] [place] [address] [phone]                Discharge Instructions     Diet - low sodium heart healthy   Complete by: As directed    Increase activity slowly   Complete by: As directed        Discharge Medications   Allergies as of 12/07/2020   No Known Allergies      Medication List     STOP taking these medications    furosemide 20 MG tablet Commonly known as: LASIX   isosorbide mononitrate 30 MG 24 hr tablet Commonly known as: IMDUR   sacubitril-valsartan 97-103 MG Commonly known as: ENTRESTO       TAKE these medications    amiodarone 200 MG tablet Commonly known as: Pacerone Take 1 tablet (200 mg) twice daily for 2 weeks, then reduce to 1 tablet (200 mg) once a day.   apixaban 5 MG Tabs tablet Commonly known as: ELIQUIS Take 1 tablet (5 mg total) by mouth 2 (two) times daily. Start 12/09/20. Start taking on: December 09, 2020 What changed:  additional instructions These instructions start on December 09, 2020. If  you are unsure what to do until then, ask your doctor or other care provider.   aspirin EC 81 MG tablet Take 81 mg by mouth daily. Swallow whole.   atorvastatin 40 MG tablet Commonly known as: LIPITOR Take 40 mg by mouth daily.   brimonidine 0.2 % ophthalmic solution Commonly known as: ALPHAGAN Place 1 drop into both eyes 2 (two) times daily.   cholecalciferol 1000 units tablet Commonly known as: VITAMIN D Take 1,000 Units by mouth 2 (two) times daily.   dapagliflozin propanediol 10 MG Tabs tablet Commonly known as: FARXIGA Take 1 tablet (10 mg total) by mouth daily.   dorzolamide-timolol 22.3-6.8 MG/ML ophthalmic solution Commonly known as: COSOPT Place 1 drop into both eyes 2 (two) times daily.   fluticasone 50 MCG/ACT nasal spray Commonly known as: FLONASE Place 2 sprays into both nostrils daily as needed for allergies or rhinitis.   methotrexate 2.5 MG tablet Commonly known as: RHEUMATREX Take 4 tablets (10 mg total) by mouth once a week. Start taking on: December 11, 2020   metoprolol succinate 100 MG 24 hr tablet Commonly known as: TOPROL-XL Take 50 mg by mouth daily. Take with or immediately following a meal.   PEG 3350 17 GM/SCOOP Powd TAKE 4 LITERS (CONTENTS OF CONTAINER) BY MOUTH ONCE DO NOT MIX UNTIL INSTRUCTED BY GI CLINIC (FILL WITH TAP WATER TO  LINE NEAR TOP OF BOTTLE. GENTLY SHAKE BOTTLE TO DISSOLVE POWDER, THEN DRINK AS DIRECTED BY YOUR DOCTOR  PLEASE FOLLOW INSTRUCTIONS FROM GI CLINIC. DATE OF PROCEDURE:______5/11/22 DO NOT MIX UNTIL INSTRUCTED BY GI CLINIC (FILL WITH TAP WATER TO  LINE NEAR TOP OF BOTTLE. GENTLY SHAKE BOTTLE TO DISSOLVE POWDER, THEN DRINK AS DIRECTED BY YOUR DOCTOR  PLEASE FOLLOW INSTRUCTIONS FROM GI CLINIC. DATE OF PROCEDURE:______5/11/22   predniSONE 10 MG tablet Commonly known as: DELTASONE Take 3 tablets (  30 mg total) by mouth daily. What changed: how much to take   spironolactone 25 MG tablet Commonly known as: ALDACTONE Take 1  tablet (25 mg total) by mouth daily.   sulfamethoxazole-trimethoprim 800-160 MG tablet Commonly known as: BACTRIM DS Take 1 tablet by mouth 3 (three) times a week. Start taking on: December 09, 2020           Outstanding Labs/Studies   Needs BMP in 1 week  Needs to restart lasix, entresto and imdur, when able  Duration of Discharge Encounter   Greater than 30 minutes including physician time.  Signed, Ledora Bottcher, PA 12/07/2020, 1:42 PM  EP Attending  Patient seen and examined. See my rounding note today for additional details. The patient is stable for DC. F/U as above.  Carleene Overlie Otilia Kareem,MD

## 2020-12-07 NOTE — Progress Notes (Signed)
Progress Note  Patient Name: [name] Date of Encounter: 12/07/2020  Primary Cardiologist: No primary care provider on file.   Subjective   Incision sore. No chest pain.  Inpatient Medications    Scheduled Meds: . amiodarone  200 mg Oral BID  . atorvastatin  40 mg Oral q1800  . cholecalciferol  1,000 Units Oral BID  . folic acid  1 mg Oral Daily  . isosorbide mononitrate  15 mg Oral Daily  . mouth rinse  15 mL Mouth Rinse BID  . methotrexate  10 mg Oral Weekly  . metoprolol succinate  25 mg Oral BID  . predniSONE  30 mg Oral Q breakfast  . sodium chloride flush  3 mL Intravenous Q12H  . [START ON 12/09/2020] sulfamethoxazole-trimethoprim  1 tablet Oral Once per day on Mon Wed Fri   Continuous Infusions: . sodium chloride     PRN Meds: sodium chloride, acetaminophen, acetaminophen, ondansetron (ZOFRAN) IV, sodium chloride flush   Vital Signs    Vitals:   12/06/20 1912 12/06/20 2154 12/07/20 0352 12/07/20 0836  BP: 91/74 (!) 89/64 99/75 110/81  Pulse: 76 85 70 73  Resp: 17  15   Temp: 97.6 F (36.4 C)  97.6 F (36.4 C)   TempSrc: Oral  Oral   SpO2: 99%  99%   Weight:   65.3 kg   Height:       No intake or output data in the 24 hours ending 12/07/20 0856 Filed Weights   12/05/20 0506 12/06/20 0500 12/07/20 0352  Weight: 67.1 kg 64.3 kg 65.3 kg    Telemetry    nsr - Personally Reviewed  ECG    nsr - Personally Reviewed  Physical Exam   GEN: No acute distress.   Neck: No JVD Cardiac: RRR, no murmurs, rubs, or gallops.  Respiratory: Clear to auscultation bilaterally. Small hematoma GI: Soft, nontender, non-distended  MS: No edema; No deformity. Neuro:  Nonfocal  Psych: Normal affect   Labs    Chemistry Recent Labs  Lab 12/03/20 1444 12/04/20 0210 12/05/20 0322 12/05/20 1357 12/06/20 0351 12/07/20 0144  NA 143   < > 137 139  143 132* 136  K 3.8   < > 4.4 3.5  3.0* 3.8 4.7  CL 108   < > 98  --  98 102  CO2 29   < > 31  --  25 23   GLUCOSE 105*   < > 127*  --  129* 127*  BUN 29*   < > 38*  --  45* 44*  CREATININE 1.94*   < > 2.08*  --  2.06* 1.80*  CALCIUM 8.9   < > 9.3  --  9.1 8.9  PROT 6.3*  --   --   --   --   --   ALBUMIN 3.2*  --   --   --   --   --   AST 29  --   --   --   --   --   ALT 41  --   --   --   --   --   ALKPHOS 71  --   --   --   --   --   BILITOT 1.5*  --   --   --   --   --   GFRNONAA 36*   < > 33*  --  33* 39*  ANIONGAP 6   < > 8  --  9 11   < > =  values in this interval not displayed.     Hematology Recent Labs  Lab 12/03/20 1444 12/05/20 0322 12/05/20 1357  WBC 8.3  --   --   RBC 3.84*  --   --   HGB 13.3 14.0 16.7  16.0  HCT 40.2  --  49.0  47.0  MCV 104.7*  --   --   MCH 34.6*  --   --   MCHC 33.1  --   --   RDW 18.2*  --   --   PLT 99*  --   --     Cardiac EnzymesNo results for input(s): TROPONINI in the last 168 hours. No results for input(s): TROPIPOC in the last 168 hours.   BNP Recent Labs  Lab 12/03/20 1444  BNP >4,500.0*     DDimer No results for input(s): DDIMER in the last 168 hours.   Radiology    DG Chest 2 View  Result Date: 12/07/2020 CLINICAL DATA:  Post ICD EXAM: CHEST - 2 VIEW COMPARISON:  Chest x-ray dated 12/03/2020. FINDINGS: New LEFT chest wall ICD hardware in place. No pleural effusion or pneumothorax is seen. No confluent opacity to suggest a developing pneumonia. No evidence of pulmonary edema. Stable cardiomegaly. No acute appearing osseous abnormality. IMPRESSION: New ICD hardware in place. No pneumothorax or evidence of other procedural complicating feature. Electronically Signed   By: Franki Cabot M.D.   On: 12/07/2020 07:48   CARDIAC CATHETERIZATION  Result Date: 12/05/2020 Findings: RA = 1 RV = 31/2 PA = 30/15 (20) PCW = 13 Fick cardiac output/index = 3.9/2.1 PVR = 1.9 WU Ao sat = 98% PA sat =62%, 64% Assessment: 1. Low filling pressures 2. Moderately reduced CO Plan/Discussion: Stop diuretics. Glori Bickers, MD 2:07 PM     Cardiac Studies   none  Patient Profile     75 y.o. male admitted with atrial fib and RVR, started on dofetilide and QT too long, now, s/p ICD and on amiodarone.  Assessment & Plan    1. PAF - he is back in NSR. Continue amiodarone. We will start elliquis back on 12/09/20 as he has a small hematoma. 2. ICD - his device interrogation demonstrates normal Biv ICD function. He will be followed up in our office in a couple of weeks.  3. Chronic systolic heart failure - he appears euvolemic. Continue home meds. 4. Hypotension -this has resolved.  5. Disp. - he is stable for DC home. Usual followup.  For questions or updates, please contact [facility] Please consult [email or web address] for contact info under Cardiology/STEMI.      Signed, [name], MD  12/07/2020, 8:56 AM  Patient ID: [name], male   DOB: October 29, 1945, 75 y.o.   MRN: [id number]

## 2020-12-09 ENCOUNTER — Encounter (HOSPITAL_COMMUNITY): Payer: Self-pay | Admitting: Cardiology

## 2020-12-09 ENCOUNTER — Other Ambulatory Visit (HOSPITAL_COMMUNITY): Payer: Self-pay

## 2020-12-09 MED FILL — Lidocaine HCl Local Inj 1%: INTRAMUSCULAR | Qty: 45 | Status: AC

## 2020-12-10 ENCOUNTER — Other Ambulatory Visit: Payer: Self-pay | Admitting: *Deleted

## 2020-12-10 NOTE — Patient Outreach (Signed)
[facility]) Care Management  12/10/2020  [name] [birth year] [id number]   [facility] Outreach to complex care patient Discharge from [place] on [place] Day #1 Monday 12/09/20 1706  Red alert reason  Scheduled a follow-up appointment? I Don't Know  And   Day #4Date: Thursday 08/15/20 1003 Red Alert Reason: Know who to call about changes in condition? No  scheduled follow-up? No  Other questions/problems? yes  Insurance:NextGen MedicareVA benefit [facility] admissions x2ED visits x 2in the last 6 months Last admission  12/03/20 -12/07/20 atrial fibrillation (for a week) -had not taken Eliquis - Was direct admitted for tikosyn, but tikosyn D/C'ed due to prolonged QT 08/06/20 to 12/18/21Hypertension (HTN), monomorphic ventricular tachycardia,Chronic Kidney disease (CKD)stage 3 a, acute on chronic heart failure atypical chest pain, acute respiratory failure with hypoxia secondary to heart failure   Patient is able to verify HIPAA ([place]) identifiers Reviewed and addressed the purpose of the follow up call with the patient  Consent: [facility] RN CM reviewed [facility] services with patient. Patient gave verbal consent for services.   Follow up Overall he reports he is doing well He is wearing a sling  He denies social determinants of health (SDOH) needs He continues to work with the [employer] administration ([place]) related to his medications Reports he is still pending VA assist with F  still need farxiga  EMMI Reviewed his appointments per the after summary report Noted a few are duplicates   6/44/03 4742 D Kayleen Memos, NP [facility] atrial fibriallation clinic  12/17/20 1600 cardiology wound check (defibrillator)  Scheduled for a colonoscopy on Jan 01 2021 [place] Team 9A Jan 02 2021 11 am  [facility] PCP Dorothea Ogle, PA-C--2  pm new pt of Dr Kathrynn Humble CV vascular center  01/08/21 1130 Dr Virl Axe cardiology -defib check   Plans Patient agrees to care plan and follow up within the next 7-10 business days Pt encouraged to return a call to [facility] CM or her coverage person prn   Hamp Moreland L. Lavina Hamman, RN, BSN, [place] Coordinator Office number [phone] [facility] number [phone] Fax number [phone]

## 2020-12-13 ENCOUNTER — Encounter (HOSPITAL_COMMUNITY): Payer: Self-pay | Admitting: Nurse Practitioner

## 2020-12-13 ENCOUNTER — Other Ambulatory Visit: Payer: Self-pay

## 2020-12-13 ENCOUNTER — Ambulatory Visit (HOSPITAL_COMMUNITY)
Admit: 2020-12-13 | Discharge: 2020-12-13 | Disposition: A | Discharge status: Home / Self Care | Payer: Medicare Other | Attending: Nurse Practitioner | Admitting: Nurse Practitioner

## 2020-12-13 ENCOUNTER — Ambulatory Visit (INDEPENDENT_AMBULATORY_CARE_PROVIDER_SITE_OTHER): Payer: Medicare Other | Admitting: Emergency Medicine

## 2020-12-13 VITALS — BP 104/68 | HR 74 | Ht 70.5 in | Wt 152.4 lb

## 2020-12-13 DIAGNOSIS — I739 Peripheral vascular disease, unspecified: Secondary | ICD-10-CM | POA: Insufficient documentation

## 2020-12-13 DIAGNOSIS — N183 Chronic kidney disease, stage 3 unspecified: Secondary | ICD-10-CM | POA: Insufficient documentation

## 2020-12-13 DIAGNOSIS — I4891 Unspecified atrial fibrillation: Secondary | ICD-10-CM | POA: Insufficient documentation

## 2020-12-13 DIAGNOSIS — I4729 Other ventricular tachycardia: Secondary | ICD-10-CM

## 2020-12-13 DIAGNOSIS — I5042 Chronic combined systolic (congestive) and diastolic (congestive) heart failure: Secondary | ICD-10-CM | POA: Insufficient documentation

## 2020-12-13 DIAGNOSIS — I2510 Atherosclerotic heart disease of native coronary artery without angina pectoris: Secondary | ICD-10-CM | POA: Insufficient documentation

## 2020-12-13 DIAGNOSIS — I480 Paroxysmal atrial fibrillation: Secondary | ICD-10-CM

## 2020-12-13 DIAGNOSIS — Z95820 Peripheral vascular angioplasty status with implants and grafts: Secondary | ICD-10-CM | POA: Insufficient documentation

## 2020-12-13 DIAGNOSIS — I472 Ventricular tachycardia: Secondary | ICD-10-CM

## 2020-12-13 DIAGNOSIS — R590 Localized enlarged lymph nodes: Secondary | ICD-10-CM | POA: Diagnosis not present

## 2020-12-13 DIAGNOSIS — Z87891 Personal history of nicotine dependence: Secondary | ICD-10-CM | POA: Diagnosis not present

## 2020-12-13 DIAGNOSIS — Z7952 Long term (current) use of systemic steroids: Secondary | ICD-10-CM | POA: Insufficient documentation

## 2020-12-13 DIAGNOSIS — Z7984 Long term (current) use of oral hypoglycemic drugs: Secondary | ICD-10-CM | POA: Insufficient documentation

## 2020-12-13 DIAGNOSIS — Z95810 Presence of automatic (implantable) cardiac defibrillator: Secondary | ICD-10-CM | POA: Diagnosis not present

## 2020-12-13 DIAGNOSIS — Z79899 Other long term (current) drug therapy: Secondary | ICD-10-CM | POA: Insufficient documentation

## 2020-12-13 DIAGNOSIS — Z7901 Long term (current) use of anticoagulants: Secondary | ICD-10-CM | POA: Insufficient documentation

## 2020-12-13 DIAGNOSIS — I50814 Right heart failure due to left heart failure: Secondary | ICD-10-CM | POA: Diagnosis not present

## 2020-12-13 DIAGNOSIS — D6869 Other thrombophilia: Secondary | ICD-10-CM

## 2020-12-13 DIAGNOSIS — R918 Other nonspecific abnormal finding of lung field: Secondary | ICD-10-CM | POA: Insufficient documentation

## 2020-12-13 DIAGNOSIS — Z792 Long term (current) use of antibiotics: Secondary | ICD-10-CM | POA: Diagnosis not present

## 2020-12-13 DIAGNOSIS — I459 Conduction disorder, unspecified: Secondary | ICD-10-CM

## 2020-12-13 LAB — BASIC METABOLIC PANEL
Anion gap: 5 (ref 5–15)
BUN: 23 mg/dL (ref 8–23)
CO2: 23 mmol/L (ref 22–32)
Calcium: 8.8 mg/dL — ABNORMAL LOW (ref 8.9–10.3)
Chloride: 108 mmol/L (ref 98–111)
Creatinine, Ser: 1.56 mg/dL — ABNORMAL HIGH (ref 0.61–1.24)
GFR, Estimated: 46 mL/min — ABNORMAL LOW (ref >60)
Glucose, Bld: 91 mg/dL (ref 70–99)
Potassium: 4.2 mmol/L (ref 3.5–5.1)
Sodium: 136 mmol/L (ref 135–145)

## 2020-12-13 NOTE — Progress Notes (Signed)
Primary Care Physician: Linus Mako, NP Referring Physician: Dr. [name] [facility] f/u    [name] is a 75 y.o. male with a h/o chronic combined heart failure with RV failure, NICM with nonobstructive CAD, Afib on eliquis, and NSVT, CKD stage III, PAD s/p bifem bypass, aorto-iliac aneurysm repair, tobacco use (quite smoking 2021), and bilateral pulmonary nodules and mild mediastinal and hilar adenopathy.    He was admitted in Dec 2021 and HF medications adjusted to GDMT. Heart cath with mild nonobstructive disease, echo with EF 20-25% wit moderate RV dysfunction and moderate MR. cMRI suggestive of diffuse cardaic sarcoid, confirmed with PET - followed by [facility]. Taking prednisone, methotrexate, and bactrim. Dry weight thought to be near 139 lbs.   He was seen by Dr. [name] for Afib and scheduled for TEE-guided DCCV, but presented and was in NSR. Heart monitor did show a 7% Afib burden, but 43 episodes of NSVT. Of note, he never started eliquis. He was seen 12/02/20 by Dr. [name] and was back in Afib. Pt suspected he had been in Afib for about a week and had SOB, He was direct admitted for tikosyn.   Tikosyn was stopped due to prolonged qt and he was started on amiodarone 200 mg bid x 2 weeks. BiV ICD with CRT-D was placed 12/06/20.  He was discharged last Saturday 4/16. He was told to restart eliquis 4/18 which he has done. In d/c instructions it stated to continue  ASA but he has not done that.   He is very uncomfortable with his L chest  implantation site that he states is throbbing like a toothache, 8/10. It is keeping him from sleeping well at night. The bandage is present, intact  and dry. I do not see any significant ecchymosis around the dressing but the area is very taunt,swollen,  no redness noted.   Today, he denies symptoms of palpitations, chest pain, shortness of breath, orthopnea, PND, lower extremity edema, dizziness, presyncope, syncope, or neurologic sequela. The patient is  tolerating medications without difficulties and is otherwise without complaint today.   Past Medical History:  Diagnosis Date  . ABDOMINAL AORTIC ANEURYSM   . CAD    Non obstructive (mild) cath 2007  . CHF    EF 15% in 2007, 25% currently  . GLAUCOMA   . HYPERTENSION   . TOBACCO ABUSE    Previous   Past Surgical History:  Procedure Laterality Date  . BIV ICD INSERTION CRT-D N/A 12/06/2020   Procedure: BIV ICD INSERTION CRT-D;  Surgeon: Vickie Epley, MD;  Location: [place] [facility];  Service: Cardiovascular;  Laterality: N/A;  . Clavical Repair    . CORNEAL TRANSPLANT     Right eye x 2  . EYE SURGERY     Glaucoma  . RIGHT HEART CATH N/A 12/05/2020   Procedure: RIGHT HEART CATH;  Surgeon: Jolaine Artist, MD;  Location: [place] [facility];  Service: Cardiovascular;  Laterality: N/A;  . RIGHT/LEFT HEART CATH AND CORONARY ANGIOGRAPHY N/A 08/08/2020   Procedure: RIGHT/LEFT HEART CATH AND CORONARY ANGIOGRAPHY;  Surgeon: Jolaine Artist, MD;  Location: [place] [facility];  Service: Cardiovascular;  Laterality: N/A;    Current Outpatient Medications  Medication Sig Dispense Refill  . amiodarone (PACERONE) 200 MG tablet Take 1 tablet (200 mg) twice daily for 2 weeks, then reduce to 1 tablet (200 mg) once a day. 42 tablet 3  . apixaban (ELIQUIS) 5 MG TABS tablet Take 1 tablet (5 mg  total) by mouth 2 (two) times daily. Start 12/09/20. 180 tablet 3  . aspirin EC 81 MG tablet Take 81 mg by mouth daily. Swallow whole.    Marland Kitchen atorvastatin (LIPITOR) 40 MG tablet Take 40 mg by mouth daily.    . brimonidine (ALPHAGAN) 0.2 % ophthalmic solution Place 1 drop into both eyes 2 (two) times daily.    . cholecalciferol (VITAMIN D) 1000 UNITS tablet Take 1,000 Units by mouth 2 (two) times daily.     . dapagliflozin propanediol (FARXIGA) 10 MG TABS tablet Take 1 tablet (10 mg total) by mouth daily. 90 tablet 3  . dorzolamide-timolol (COSOPT) 22.3-6.8 MG/ML ophthalmic solution Place 1 drop into  both eyes 2 (two) times daily.    . fluticasone (FLONASE) 50 MCG/ACT nasal spray Place 2 sprays into both nostrils daily as needed for allergies or rhinitis.    . methotrexate (RHEUMATREX) 2.5 MG tablet Take 4 tablets (10 mg total) by mouth once a week. 24 tablet 3  . metoprolol succinate (TOPROL-XL) 100 MG 24 hr tablet Take 50 mg by mouth daily. Take with or immediately following a meal.    . Polyethylene Glycol 3350 (PEG 3350) 17 GM/SCOOP POWD TAKE 4 LITERS (CONTENTS OF CONTAINER) BY MOUTH ONCE DO NOT MIX UNTIL INSTRUCTED BY GI CLINIC (FILL WITH TAP WATER TO  LINE NEAR TOP OF BOTTLE. GENTLY SHAKE BOTTLE TO DISSOLVE POWDER, THEN DRINK AS DIRECTED BY YOUR DOCTOR  PLEASE FOLLOW INSTRUCTIONS FROM GI CLINIC. DATE OF PROCEDURE:______5/11/22 DO NOT MIX UNTIL INSTRUCTED BY GI CLINIC (FILL WITH TAP WATER TO  LINE NEAR TOP OF BOTTLE. GENTLY SHAKE BOTTLE TO DISSOLVE POWDER, THEN DRINK AS DIRECTED BY YOUR DOCTOR  PLEASE FOLLOW INSTRUCTIONS FROM GI CLINIC. DATE OF PROCEDURE:______5/11/22    . predniSONE (DELTASONE) 10 MG tablet Take 3 tablets (30 mg total) by mouth daily. (Patient taking differently: Take 10 mg by mouth daily.) 90 tablet 11  . spironolactone (ALDACTONE) 25 MG tablet Take 1 tablet (25 mg total) by mouth daily. 90 tablet 3  . sulfamethoxazole-trimethoprim (BACTRIM DS) 800-160 MG tablet Take 1 tablet by mouth 3 (three) times a week. 12 tablet 3   No current facility-administered medications for this encounter.    No Known Allergies  Social History   Socioeconomic History  . Marital status: Single    Spouse name: Not on file  . Number of children: 5  . Years of education: high school graduate with 3 years of college  . Highest education level: Some college, no degree  Occupational History  . 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 Smoking status: Former Smoker    Quit date: 03/08/2008    Years since quitting: 12.7  . Smokeless tobacco: Never Used   Vaping Use  . Vaping Use: Never used  Substance and Sexual Activity  . Alcohol use: No  . Drug use: No  . Sexual activity: Not on file  Other Topics Concern  . Not on file  Social History Narrative   single male who lives alone but with the support of his daughter Voris Tigert. He is doing well with his care needs    He works in [employer] part time.   high school graduate with 3 years of college   5 children    Male pit bull in home- [place]   Social Determinants of Health   Financial Resource Strain: Medium Risk  . Difficulty of Paying Living Expenses: Somewhat hard  Food  Insecurity: No Food Insecurity  . Worried About Charity fundraiser in the Last Year: Never true  . Ran Out of Food in the Last Year: Never true  Transportation Needs: No Transportation Needs  . Lack of Transportation (Medical): No  . Lack of Transportation (Non-Medical): No  Physical Activity: Not on file  Stress: No Stress Concern Present  . Feeling of Stress : Only a little  Social Connections: Not on file  Intimate Partner Violence: Not At Risk  . Fear of Current or Ex-Partner: No  . Emotionally Abused: No  . Physically Abused: No  . Sexually Abused: No    Family History  Problem Relation Age of Onset  . Diabetes Mother   . Diabetes Sister   . Diabetes Brother     ROS- All systems are reviewed and negative except as per the HPI above  Physical Exam: Vitals:   12/13/20 0839  BP: 104/68  Pulse: 74  Weight: 69.1 kg  Height: 5' 10.5" (1.791 m)   Wt Readings from Last 3 Encounters:  12/13/20 69.1 kg  12/07/20 65.3 kg  12/02/20 70.8 kg    Labs: Lab Results  Component Value Date   NA 136 12/07/2020   K 4.7 12/07/2020   CL 102 12/07/2020   CO2 23 12/07/2020   GLUCOSE 127 (H) 12/07/2020   BUN 44 (H) 12/07/2020   CREATININE 1.80 (H) 12/07/2020   CALCIUM 8.9 12/07/2020   PHOS 3.2 07/02/2020   MG 1.9 12/07/2020   Lab Results  Component Value Date   INR 1.3 (H) 08/06/2020    Lab Results  Component Value Date   CHOL 106 08/07/2020   HDL 26 (L) 08/07/2020   LDLCALC 66 08/07/2020   TRIG 68 08/07/2020     GEN- The patient is well appearing, alert and oriented x 3 today.   Head- normocephalic, atraumatic Eyes-  Sclera clear, conjunctiva pink Ears- hearing intact Oropharynx- clear Neck- supple, no JVP Lymph- no cervical lymphadenopathy Lungs- Clear to ausculation bilaterally, normal work of breathing Heart- Regular rate and rhythm, no murmurs, rubs or gallops, PMI not laterally displaced, dressing in place left upper chest, area swollen and taunt, no heat or redness noted at area GI- soft, NT, ND, + BS Extremities- no clubbing, cyanosis, or edema MS- no significant deformity or atrophy Skin- no rash or lesion Psych- euthymic mood, full affect Neuro- strength and sensation are intact  EKG-AV dual paced rhythm with prolonged condcution, v rate 74 ms, qrs int  154 ms , qtc 750 ms ( does not appear that prolonged)    Assessment and Plan: 1. Afib Was admitted for Tikosyn start but drug had to d/c'ed for prolongation of qt. He is loading on amiodarone 200 mg bid He is aware to reduce this dose after  2 weeks to one a day.   2. BiV ICD placed 12/06/20 He is wearing arm sling and avoiding using L arm as instructed  C/o of 8/10 "throbbing pain"  At  [facility] site left upper chest Dressing intact, no ecchymosis noted around dressing but area is swollen and taunt  He did not restart asa as instructed, did start eliquis 5 mg bid 4/18 He will be sent to the device clinic from here to have PPM site evaluated He is to ask for clarification re taking asa/DOAC with swollen PPM site. CHA2DS2VASc score of at least 4  F/u with device clinic this am and again 4/26, Dr. Haroldine Laws 5/12/2, Dr. [name]  5/18  Butch Penny  Eulah Pont, [facility] [place] [address] [place], [place] [address] [phone]

## 2020-12-13 NOTE — Patient Instructions (Addendum)
Please discontinue your aspirin, eliquis, and NSAIDS (ibuprofen, alieve, motrin) until follow up with Dr. Quentin Ore.  I have marked the swelling. If you notice that the swelling significantly increases beyond the marking please call office at [phone]. If you are unable to receive assistance in the device clinic and swelling continue after stopping above medications, please go to your local urgent care or emergency room. Do not shower until instructed.   Follow up Tuesday in device clinic as scheduled.

## 2020-12-13 NOTE — Progress Notes (Signed)
Patient referred to device clinic today by Roderic Palau for device implant site assessment. Bi-V ICD inserted 12/06/20. Patient arrived to device clinic today with sling and outer large dressing still in place. Sling and outer dressing removed. Large firm hematoma noted and marked, with edema extending into the left breast tissue. Steri-strips remain intact with minimal amt of dried blood. Patient admits to compliance with prescribed asa and eliquis. Took ibuprofen last PM for site discomfort. Dr. Lovena Le assessed site and instructed patient to discontinue all NSAIDS, ASA, and [place]. Patient instructed to utilize tylenol for discomfort. Patient reminded of arm restrictions. He is provided clinic number and instructed to seek medical care if hematoma extends significantly beyond marked area. Has follow up in device clinic 12/17/20. Dr. Quentin Ore will be in office to assess hematoma and to discuss when to restart ASA and [place].

## 2020-12-17 ENCOUNTER — Ambulatory Visit (INDEPENDENT_AMBULATORY_CARE_PROVIDER_SITE_OTHER): Payer: Medicare Other | Admitting: Emergency Medicine

## 2020-12-17 ENCOUNTER — Other Ambulatory Visit: Payer: Self-pay

## 2020-12-17 ENCOUNTER — Other Ambulatory Visit: Payer: Self-pay | Admitting: *Deleted

## 2020-12-17 DIAGNOSIS — I429 Cardiomyopathy, unspecified: Secondary | ICD-10-CM | POA: Diagnosis not present

## 2020-12-17 DIAGNOSIS — I459 Conduction disorder, unspecified: Secondary | ICD-10-CM

## 2020-12-17 LAB — CUP PACEART INCLINIC DEVICE CHECK
Battery Remaining Longevity: 50 mo
Brady Statistic RA Percent Paced: 88 %
Brady Statistic RV Percent Paced: 95 %
Date Time Interrogation Session: 20220426163513
HighPow Impedance: 58.5 Ohm
Implantable Lead Implant Date: 20220415
Implantable Lead Implant Date: 20220415
Implantable Lead Implant Date: 20220415
Implantable Lead Location: 753858
Implantable Lead Location: 753859
Implantable Lead Location: 753860
Implantable Pulse Generator Implant Date: 20220415
Lead Channel Impedance Value: 437.5 Ohm
Lead Channel Impedance Value: 500 Ohm
Lead Channel Impedance Value: 675 Ohm
Lead Channel Pacing Threshold Amplitude: 0.5 V
Lead Channel Pacing Threshold Amplitude: 0.5 V
Lead Channel Pacing Threshold Amplitude: 1.25 V
Lead Channel Pacing Threshold Pulse Width: 0.5 ms
Lead Channel Pacing Threshold Pulse Width: 0.5 ms
Lead Channel Pacing Threshold Pulse Width: 0.5 ms
Lead Channel Sensing Intrinsic Amplitude: 4.4 mV
Lead Channel Sensing Intrinsic Amplitude: 6.8 mV
Lead Channel Setting Pacing Amplitude: 3.5 V
Lead Channel Setting Pacing Amplitude: 3.5 V
Lead Channel Setting Pacing Amplitude: 3.5 V
Lead Channel Setting Pacing Pulse Width: 0.5 ms
Lead Channel Setting Pacing Pulse Width: 0.5 ms
Lead Channel Setting Sensing Sensitivity: 0.5 mV
Pulse Gen Serial Number: 111040714

## 2020-12-17 NOTE — Patient Outreach (Signed)
[facility]) Care Management  12/17/2020  Izea Livolsi [birth year] [id number]  Telephone outreach for RED FLAG ON EMMI CALL, unsuccessful, left a message to return my call for problems or questions.  Eulah Pont. Myrtie Neither, MSN, [facility] Gerontological Nurse Practitioner [facility] Care Management [phone]

## 2020-12-17 NOTE — Progress Notes (Signed)
Wound check appointment. Steri-strips removed. Wound without redness.  Noted Hematoma present, area is firm.  Dr. Quentin Ore observed and advised continued monitoring. Incision edges approximated, wound well healed. Normal device function. Thresholds, sensing, and impedances consistent with implant measurements. Device programmed at 3.5V for extra safety margin until 3 month visit. Histogram distribution appropriate for patient and level of activity. 1 AMS episode lasting 30 seconds, Peak A/V rate 244/113 (AMS burden <0.1%). +OAC to be restarted today per Dr. Quentin Ore.  1 PMT episode reviewed, appears Atrial tracking at max rate.  Retrograde conduction time measured today at 344 ms.  Patient educated about wound care, arm mobility, lifting restrictions. Patient is enrolled in remote monitoring, next scheduled check 03/10/21.  ROV with Dr. [name] 01/08/21.

## 2020-12-20 ENCOUNTER — Other Ambulatory Visit: Payer: Self-pay | Admitting: *Deleted

## 2020-12-20 NOTE — Patient Outreach (Signed)
[facility]) Care Management  12/20/2020  Jamiah Recore 1946/01/17 [id number]   [facility] Unsuccessful outreach to complex care patient Discharge from [facility] cone on [place] Day #1 Monday 12/09/20 1706  Red alert reason  Scheduled a follow-up appointment? I Don't Know  And   Day #4Date: Thursday 08/15/20 1003 Red Alert Reason: Know who to call about changes in condition? No  scheduled follow-up? No  Other questions/problems? yes  Insurance:NextGen MedicareVA benefit [facility] admissions x2ED visits x 2in the last 6 months Last admission  12/03/20 -12/07/20 atrial fibrillation (for a week) -had not taken Eliquis - Was direct admitted for tikosyn, but tikosyn D/C'ed due to prolonged QT 08/06/20 to 12/18/21Hypertension (HTN), monomorphic ventricular tachycardia,Chronic Kidney disease (CKD)stage 3 a, acute on chronic heart failure atypical chest pain, acute respiratory failure with hypoxia secondary to heart failure    Outreach attempt to the home number  No answer. [facility] RN CM left HIPAA [facility] Portability and Accountability Act) compliant voicemail message along with CM's contact info.   Plan: [facility] RN CM scheduled this patient for another call attempt within 4-7 business days  Lewin Pellow L. Lavina Hamman, RN, BSN, Icard Coordinator Office number [phone] Mobile number [phone]  Main [facility] number [phone] Fax number [phone]

## 2020-12-24 DIAGNOSIS — I739 Peripheral vascular disease, unspecified: Secondary | ICD-10-CM | POA: Diagnosis not present

## 2020-12-24 DIAGNOSIS — E785 Hyperlipidemia, unspecified: Secondary | ICD-10-CM | POA: Diagnosis not present

## 2020-12-24 DIAGNOSIS — Z79899 Other long term (current) drug therapy: Secondary | ICD-10-CM | POA: Diagnosis not present

## 2020-12-24 DIAGNOSIS — F17200 Nicotine dependence, unspecified, uncomplicated: Secondary | ICD-10-CM | POA: Diagnosis not present

## 2020-12-24 DIAGNOSIS — I5020 Unspecified systolic (congestive) heart failure: Secondary | ICD-10-CM | POA: Diagnosis not present

## 2020-12-24 DIAGNOSIS — D869 Sarcoidosis, unspecified: Secondary | ICD-10-CM | POA: Diagnosis not present

## 2020-12-24 DIAGNOSIS — N183 Chronic kidney disease, stage 3 unspecified: Secondary | ICD-10-CM | POA: Diagnosis not present

## 2020-12-24 DIAGNOSIS — R9439 Abnormal result of other cardiovascular function study: Secondary | ICD-10-CM | POA: Diagnosis not present

## 2020-12-24 DIAGNOSIS — I130 Hypertensive heart and chronic kidney disease with heart failure and stage 1 through stage 4 chronic kidney disease, or unspecified chronic kidney disease: Secondary | ICD-10-CM | POA: Diagnosis not present

## 2020-12-24 DIAGNOSIS — Z7952 Long term (current) use of systemic steroids: Secondary | ICD-10-CM | POA: Diagnosis not present

## 2020-12-25 DIAGNOSIS — Z20822 Contact with and (suspected) exposure to covid-19: Secondary | ICD-10-CM | POA: Diagnosis not present

## 2020-12-25 DIAGNOSIS — I739 Peripheral vascular disease, unspecified: Secondary | ICD-10-CM | POA: Diagnosis not present

## 2020-12-25 DIAGNOSIS — I5020 Unspecified systolic (congestive) heart failure: Secondary | ICD-10-CM | POA: Diagnosis not present

## 2020-12-25 DIAGNOSIS — Z79899 Other long term (current) drug therapy: Secondary | ICD-10-CM | POA: Diagnosis not present

## 2020-12-25 DIAGNOSIS — R609 Edema, unspecified: Secondary | ICD-10-CM | POA: Diagnosis not present

## 2020-12-25 DIAGNOSIS — T827XXA Infection and inflammatory reaction due to other cardiac and vascular devices, implants and grafts, initial encounter: Secondary | ICD-10-CM | POA: Diagnosis not present

## 2020-12-25 DIAGNOSIS — Z5181 Encounter for therapeutic drug level monitoring: Secondary | ICD-10-CM | POA: Diagnosis not present

## 2020-12-25 DIAGNOSIS — Z87891 Personal history of nicotine dependence: Secondary | ICD-10-CM | POA: Diagnosis not present

## 2020-12-25 DIAGNOSIS — Z95810 Presence of automatic (implantable) cardiac defibrillator: Secondary | ICD-10-CM | POA: Diagnosis not present

## 2020-12-25 DIAGNOSIS — T82897A Other specified complication of cardiac prosthetic devices, implants and grafts, initial encounter: Secondary | ICD-10-CM | POA: Diagnosis not present

## 2020-12-26 ENCOUNTER — Other Ambulatory Visit: Payer: Self-pay

## 2020-12-26 ENCOUNTER — Other Ambulatory Visit: Payer: Self-pay | Admitting: *Deleted

## 2020-12-26 ENCOUNTER — Ambulatory Visit (INDEPENDENT_AMBULATORY_CARE_PROVIDER_SITE_OTHER): Payer: Medicare Other | Admitting: Emergency Medicine

## 2020-12-26 ENCOUNTER — Other Ambulatory Visit (HOSPITAL_COMMUNITY)
Admission: RE | Admit: 2020-12-26 | Discharge: 2020-12-26 | Disposition: A | Discharge status: Home / Self Care | Payer: Medicare Other | Source: Ambulatory Visit | Attending: Cardiology | Admitting: Cardiology

## 2020-12-26 ENCOUNTER — Telehealth: Payer: Self-pay

## 2020-12-26 DIAGNOSIS — Z20822 Contact with and (suspected) exposure to covid-19: Secondary | ICD-10-CM | POA: Diagnosis not present

## 2020-12-26 DIAGNOSIS — Z01812 Encounter for preprocedural laboratory examination: Secondary | ICD-10-CM | POA: Diagnosis not present

## 2020-12-26 DIAGNOSIS — I459 Conduction disorder, unspecified: Secondary | ICD-10-CM

## 2020-12-26 NOTE — Progress Notes (Signed)
Patient seen today in device clinic for hematoma. Patient reports since last visit site has increased in size and new bulge. Patient reports drainage from site yesterday. Denies fever or chills. Dr. Lovena Le came into room to assess. Consulted with Dr. Quentin Ore, patient will go to the hospital tomorrow 12/27/20 to have pocket cleaned out. Dr. Quentin Ore advised patient to hold Eliquis at this time. Starting back will be discussed when patients sees Dr. Quentin Ore. Patient aware and verbalized understanding.   Scheduling and pre-testing completed by Sonia Baller, RN.

## 2020-12-26 NOTE — Telephone Encounter (Signed)
Pt scheduled for pocket evacuation 12/27/2020  Work up complete

## 2020-12-26 NOTE — Patient Outreach (Signed)
[name]) Care Management  12/26/2020  [name] 09-Feb-1946 [id number]  [facility] outreach to complex care patient Discharge from [facility] cone on [place] Day #1 Monday 12/09/20 1706  Red alert reason Scheduled a follow-up appointment? I Don't Know  And  Day #4Date: Thursday 08/15/20 1003 Red Alert Reason: Know who to call about changes in condition? No  scheduled follow-up? No  Other questions/problems? yes  Insurance:NextGen MedicareVA benefit [facility] admissions x2ED visits x 2in the last 6 months Last admission 12/24/20 ED visit as [place] [facility], [facility] [place] Robert [name] BP 131/78 concern for pocket infection at defibrillator site 12/03/20 -12/07/20 atrial fibrillation (for a week) -had not taken Eliquis -Was direct admitted for [place], but Tikosyn D/C'ed due to prolonged QT 08/06/20 to 12/18/21Hypertension (HTN), monomorphic ventricular tachycardia,Chronic Kidney disease (CKD)stage 3 a, acute on chronic heart failure atypical chest pain, acute respiratory failure with hypoxia secondary to heart failure   Patient is able to verify HIPAA ([place]) identifiers Reviewed and addressed the purpose of the follow up call with the patient  Consent: [facility] ([place]) RN CM reviewed [facility] services with patient. Patient gave verbal consent for services.   Follow up assessment Mr Knee reports overall he is doing well and coping well He is thankful that his symptoms were acknowledged and being treated He reports having a [facility] cardiology MD visit (Robert [name]) on 12/25/20 and was sent from the MD office to [facility] after noted 3+ day continued swelling of defibrillator site  He reports a 8+ hour stay 12/25/20 at [facility] ED prior to leaving and deciding to follow up with his local providers  He had been seen at the local cardiology device clinic on 12/17/20 with noted hematoma. He reports  the local cardiology device clinic evaluated this hematoma with some pressing that caused increased drainage today. Seen by [name]. He had a saturated bandage that he has removed, cleaned the site and re applied a bandage [facility] RN CM encouraged keeping the site clean and dry with dressing changes  He was consulted by Robert [name] today and on 12/27/20 Robert [name] will clean out the right upper infected chest pocket  ROV with Robert [name] on 01/08/21  He will continue with remote defibrillator monitoring checks -next 03/10/21    Medicine refills [facility]) Has had all medicine concerns with diabetic and cardiac medications resolved. He continues to take all medicines as ordered Goal met   Diabetes Today's cbg value was 108 [facility] RN CM discussed diabetes sick days expectations and how to manage at home during sick days He voiced understanding   Patient Active Problem List   Diagnosis Date Noted  . Bullous keratopathy 11/28/2020  . Type 2 diabetes mellitus ([place]) 11/28/2020  . Diabetes mellitus without complication ([place]) 68/34/1962  . Primary open-angle glaucoma, right eye, severe stage 11/28/2020  . Myopia, bilateral 11/28/2020  . Presbyopia 11/28/2020  . Unspecified atrial fibrillation ([place]) 11/28/2020  . Encounter for preprocedural laboratory examination 11/28/2020  . Encounter for immunization 11/28/2020  . Dietary counseling and surveillance 11/28/2020  . Other specified counseling 11/28/2020  . Other specified health status 11/28/2020  . Erectile dysfunction 11/28/2020  . Encounter for therapeutic drug level monitoring 11/28/2020  . Hypertrophy of prostate without urinary obstruction and other lower urinary tract symptoms (LUTS) 11/28/2020  . 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 Closed fracture of ankle 11/28/2020  . Corneal ulcer  11/28/2020  . Cystoid macular edema 11/28/2020  . Depression 11/28/2020  . Lens replaced by other means 11/28/2020  .  Other and unspecified hyperlipidemia 11/28/2020  . Pain in joint 11/28/2020  . Purulent endophthalmitis 11/28/2020  . Syncope and collapse 11/28/2020  . Vitamin D deficiency 11/28/2020  . Other specified glaucoma 10/23/2020  . Other age-related cataract 10/23/2020  . Former smoker 10/22/2020  . Atypical chest pain 08/07/2020  . Elevated troponin 08/07/2020  . Acute on chronic systolic (congestive) heart failure ([place]) 08/06/2020  . Acute on chronic combined systolic and diastolic CHF (congestive heart failure) ([place]) 07/01/2020  . Acute on chronic respiratory failure with hypoxia ([place]) 07/01/2020  . Chronic kidney disease, stage 3a ([place]) 07/01/2020  . Thrombocytopenia ([place]) 07/01/2020  . Positive D dimer 07/01/2020  . Acute hypoxemic respiratory failure ([place])   . PVD (peripheral vascular disease) ([employer]) 08/29/2019  . Educated about COVID-19 virus infection 08/29/2019  . Bradycardia 09/05/2018  . Dyslipidemia 09/05/2018  . AAA (abdominal aortic aneurysm) without rupture ([place]) 09/05/2018  . Heart block 03/10/2018  . Cardiomyopathy ([place]) 10/29/2017  . TOBACCO ABUSE 11/30/2008  . CHRONIC SYSTOLIC HEART FAILURE 30/16/0109  . Peripheral arterial occlusive disease ([place]) 11/30/2008  . GLAUCOMA 11/29/2008  . Essential (primary) hypertension 11/29/2008  . Coronary atherosclerosis 11/29/2008  . Monomorphic ventricular tachycardia ([place]) 11/29/2008  . HFrEF (heart failure with reduced ejection fraction) ([place]) 11/29/2008     Plan: Patient agrees to care plan and follow up [place] scheduled this patient for another call attempt within 7 business days Pt encouraged to return a call to [facility] RN CM prn Goals Addressed              This Visit's Progress     Patient Stated   .  COMPLETED: [facility]) Manage My Medicine (pt-stated)   On track     Timeframe:  Short-Term Goal Priority:  High Start Date:                 11/28/20            Expected End Date:       12/26/20                Follow Up  Date - Goal met 12/26/20  [facility] to get newly prescribed medications - call for medicine refill 2 or 3 days before it runs out - keep a list of all the medicines I take; vitamins and herbals too - use a pillbox to sort medicine   Notes:  12/26/20 Pt confirms he has received all his medicines from the [place] to include cardiac and diabetes medicines Goal met     .  ([facility]) Monitor and Manage My Blood Sugar-Diabetes Type 2 (pt-stated)   On track     Timeframe:  Short-Term Goal Priority:  Medium Start Date:                      11/28/20       Expected End Date:     01/21/21                  Follow Up Date 01/03/21   - check blood sugar at prescribed times - check blood sugar if I feel it is too high or too low - enter blood sugar readings and medication or insulin into daily log     Notes:  12/26/20 cbg value 108 voiced understood about  DM sick expectations and home care  Has all Dm medication issues resolved via [place] 11/28/20 checking cbg every Thursday per pt. Last check on 11/21/20 =86, not checked today 11/28/20    .  [facility]) Track and Manage Symptoms-Heart Failure (pt-stated)   On track     Timeframe:  Short-Term Goal Priority:  Medium Start Date:         08/30/20                    Expected End Date:     01/19/21                    - develop a rescue plan - know when to call the doctor - track symptoms and what helps feel better or worse   Notes:  12/26/20 12/25/20 confirmed infection of defib site. Saw MD 12/26/20 planned pending procedure 12/27/20 11/28/20  No worsening CHF symptoms        Aivah Putman L. Lavina Hamman, RN, BSN, [place] Coordinator Office number [phone] Mobile number [phone]  Main [facility] number [phone] Fax number [phone]

## 2020-12-27 ENCOUNTER — Other Ambulatory Visit (HOSPITAL_COMMUNITY): Payer: Self-pay

## 2020-12-27 ENCOUNTER — Other Ambulatory Visit: Payer: Self-pay

## 2020-12-27 ENCOUNTER — Ambulatory Visit (HOSPITAL_COMMUNITY)
Admission: RE | Disposition: A | Discharge status: Home / Self Care | Payer: Self-pay | Source: Home / Self Care | Attending: Cardiology

## 2020-12-27 ENCOUNTER — Encounter (HOSPITAL_COMMUNITY): Payer: Self-pay | Admitting: Cardiology

## 2020-12-27 ENCOUNTER — Ambulatory Visit (HOSPITAL_COMMUNITY)
Admission: RE | Admit: 2020-12-27 | Discharge: 2020-12-27 | Disposition: A | Discharge status: Home / Self Care | Payer: Medicare Other | Attending: Cardiology | Admitting: Cardiology

## 2020-12-27 DIAGNOSIS — Z7901 Long term (current) use of anticoagulants: Secondary | ICD-10-CM | POA: Insufficient documentation

## 2020-12-27 DIAGNOSIS — Z7952 Long term (current) use of systemic steroids: Secondary | ICD-10-CM | POA: Diagnosis not present

## 2020-12-27 DIAGNOSIS — D8689 Sarcoidosis of other sites: Secondary | ICD-10-CM | POA: Insufficient documentation

## 2020-12-27 DIAGNOSIS — N189 Chronic kidney disease, unspecified: Secondary | ICD-10-CM | POA: Insufficient documentation

## 2020-12-27 DIAGNOSIS — T82827A Fibrosis of cardiac prosthetic devices, implants and grafts, initial encounter: Secondary | ICD-10-CM | POA: Diagnosis not present

## 2020-12-27 DIAGNOSIS — I130 Hypertensive heart and chronic kidney disease with heart failure and stage 1 through stage 4 chronic kidney disease, or unspecified chronic kidney disease: Secondary | ICD-10-CM | POA: Insufficient documentation

## 2020-12-27 DIAGNOSIS — I480 Paroxysmal atrial fibrillation: Secondary | ICD-10-CM | POA: Diagnosis not present

## 2020-12-27 DIAGNOSIS — I428 Other cardiomyopathies: Secondary | ICD-10-CM | POA: Diagnosis not present

## 2020-12-27 DIAGNOSIS — Z79899 Other long term (current) drug therapy: Secondary | ICD-10-CM | POA: Diagnosis not present

## 2020-12-27 HISTORY — PX: POCKET REVISION/RELOCATION: EP1222

## 2020-12-27 LAB — SARS CORONAVIRUS 2 (TAT 6-24 HRS): SARS Coronavirus 2: NEGATIVE

## 2020-12-27 SURGERY — POCKET REVISION/RELOCATION

## 2020-12-27 MED ORDER — ASPIRIN 81 MG PO TBEC
81.0000 mg | DELAYED_RELEASE_TABLET | Freq: Every day | ORAL | 11 refills | Status: AC
  Filled 2020-12-27: qty 30, 30d supply, fill #0
  Filled 2021-01-27 – 2021-01-28 (×2): qty 30, 30d supply, fill #1

## 2020-12-27 MED ORDER — CEFAZOLIN SODIUM-DEXTROSE 2-4 GM/100ML-% IV SOLN
2.0000 g | INTRAVENOUS | Status: AC
  Administered 2020-12-27: 2 g via INTRAVENOUS

## 2020-12-27 MED ORDER — SODIUM CHLORIDE 0.9 % IV SOLN
80.0000 mg | INTRAVENOUS | Status: AC
  Administered 2020-12-27: 80 mg

## 2020-12-27 MED ORDER — POVIDONE-IODINE 10 % EX SWAB
2.0000 "application " | Freq: Once | CUTANEOUS | Status: AC
  Administered 2020-12-27: 2 via TOPICAL

## 2020-12-27 MED ORDER — DOXYCYCLINE HYCLATE 100 MG PO TABS: 100.0000 mg | ORAL_TABLET | Freq: Two times a day (BID) | ORAL | Status: DC

## 2020-12-27 MED ORDER — SULFAMETHOXAZOLE-TRIMETHOPRIM 800-160 MG PO TABS
1.0000 | ORAL_TABLET | ORAL | 3 refills | Status: AC
  Filled 2020-12-27: qty 12, 28d supply, fill #0

## 2020-12-27 MED ORDER — SODIUM CHLORIDE 0.9 % IV SOLN
INTRAVENOUS | Status: AC
  Filled 2020-12-27: qty 2

## 2020-12-27 MED ORDER — ONDANSETRON HCL 4 MG/2ML IJ SOLN: 4.0000 mg | Freq: Four times a day (QID) | INTRAMUSCULAR | Status: DC | PRN

## 2020-12-27 MED ORDER — LIDOCAINE HCL (PF) 1 % IJ SOLN
INTRAMUSCULAR | Status: DC | PRN
  Administered 2020-12-27: 60 mL

## 2020-12-27 MED ORDER — VANCOMYCIN HCL IN DEXTROSE 1-5 GM/200ML-% IV SOLN
INTRAVENOUS | Status: AC
  Filled 2020-12-27: qty 200

## 2020-12-27 MED ORDER — ACETAMINOPHEN 325 MG PO TABS: 325.0000 mg | ORAL_TABLET | ORAL | Status: DC | PRN

## 2020-12-27 MED ORDER — DOXYCYCLINE HYCLATE 100 MG PO TABS
100.0000 mg | ORAL_TABLET | Freq: Two times a day (BID) | ORAL | 0 refills | Status: AC
  Filled 2020-12-27: qty 20, 10d supply, fill #0

## 2020-12-27 MED ORDER — CEFAZOLIN SODIUM-DEXTROSE 2-4 GM/100ML-% IV SOLN
INTRAVENOUS | Status: AC
  Filled 2020-12-27: qty 100

## 2020-12-27 MED ORDER — CHLORHEXIDINE GLUCONATE 4 % EX LIQD: 4.0000 "application " | Freq: Once | CUTANEOUS | Status: DC

## 2020-12-27 MED ORDER — VANCOMYCIN HCL 1000 MG IV SOLR
INTRAVENOUS | Status: AC | PRN
  Administered 2020-12-27: 1000 mg via INTRAVENOUS

## 2020-12-27 MED ORDER — SODIUM CHLORIDE 0.9 % IV SOLN: INTRAVENOUS | Status: DC

## 2020-12-27 MED ORDER — LIDOCAINE HCL 1 % IJ SOLN
INTRAMUSCULAR | Status: AC
  Filled 2020-12-27: qty 60

## 2020-12-27 SURGICAL SUPPLY — 5 items
CABLE SURGICAL S-101-97-12 (CABLE) ×2 IMPLANT
PAD PRO RADIOLUCENT 2001M-C (PAD) ×2 IMPLANT
POUCH AIGIS-R ANTIBACT ICD (Mesh General) ×2 IMPLANT
POUCH AIGIS-R ANTIBACT ICD LRG (Mesh General) IMPLANT
TRAY PACEMAKER INSERTION (PACKS) ×2 IMPLANT

## 2020-12-27 NOTE — Progress Notes (Signed)
Discharge instructions reviewed with pt and his grandson. Both voice understanding.

## 2020-12-27 NOTE — H&P (Signed)
Progress Note  Patient Name: [name] Date of Encounter: 12/05/2020  [facility] Cardiologist: Dr. Percival Spanish  Subjective   Breathing easier, not sure he can tell any difference in his heart rhythm. No CP  Inpatient Medications    Scheduled Meds: . atorvastatin  40 mg Oral q1800  . cholecalciferol  1,000 Units Oral BID  . dapagliflozin propanediol  10 mg Oral Daily  . dofetilide  250 mcg Oral BID  . folic acid  1 mg Oral Daily  . furosemide  80 mg Intravenous BID  . isosorbide mononitrate  15 mg Oral Daily  . mouth rinse  15 mL Mouth Rinse BID  . methotrexate  10 mg Oral Weekly  . metoprolol succinate  25 mg Oral BID  . predniSONE  30 mg Oral Q breakfast  . sodium chloride flush  3 mL Intravenous Q12H  . sodium chloride flush  3 mL Intravenous Q12H  . sodium chloride flush  3 mL Intravenous Q12H   Continuous Infusions: . sodium chloride    . sodium chloride    . sodium chloride    . sodium chloride 10 mL/hr at 12/05/20 0651   PRN Meds: sodium chloride, sodium chloride, sodium chloride, acetaminophen, sodium chloride flush, sodium chloride flush, sodium chloride flush   Vital Signs          Vitals:   12/04/20 2338 12/05/20 0505 12/05/20 0506 12/05/20 0801  BP: 94/62 100/73  101/78  Pulse: 63 71  64  Resp: 18   18  Temp: (!) 97.5 F (36.4 C)  (!) 97.2 F (36.2 C) 97.6 F (36.4 C)  TempSrc: Oral  Oral Oral  SpO2: 94% 97%  96%  Weight:   67.1 kg   Height:        Intake/Output Summary (Last 24 hours) at 12/05/2020 0823 Last data filed at 12/05/2020 0651    Gross per 24 hour  Intake 114.76 ml  Output 2825 ml  Net -2710.24 ml   Last 3 Weights 12/05/2020 12/04/2020 12/03/2020  Weight (lbs) 148 lb 153 lb 6.4 oz 152 lb 14.4 oz  Weight (kg) 67.132 kg 69.582 kg 69.355 kg      Telemetry    SR, 60's-80's 1st degree AVblock, occ PVCs, rare NSVTs noted,  PAT - Personally Reviewed  ECG    SR 68bpm, RBBB, 1st degree AVBlock,  LAD, reviewed with Dr. Quentin Ore, QT 500, QRS 137ms, QTc 468 - Personally Reviewed  Physical Exam   GEN:No acute distress.   Neck:+10 JVD Cardiac:RRR, no murmurs, rubs.  Respiratory:better, are CTA b/l this AM. YH:CWCB, nontender, non-distended  MS:No edema; No deformity. Neuro:Nonfocal  Psych: Normal affect   Labs    High Sensitivity Troponin:   Last Labs   No results for input(s): TROPONINIHS in the last 720 hours.      Chemistry Last Labs         Recent Labs  Lab 12/03/20 1444 12/04/20 0210 12/04/20 1104 12/05/20 0322  NA 143 138 138 137  K 3.8 5.1 4.0 4.4  CL 108 104 104 98  CO2 29 26 25 31   GLUCOSE 105* 158* 154* 127*  BUN 29* 28* 30* 38*  CREATININE 1.94* 1.96* 1.84* 2.08*  CALCIUM 8.9 9.1 9.2 9.3  PROT 6.3*  --   --   --   ALBUMIN 3.2*  --   --   --   AST 29  --   --   --   ALT 41  --   --   --  ALKPHOS 71  --   --   --   BILITOT 1.5*  --   --   --   GFRNONAA 36* 35* 38* 33*  ANIONGAP 6 8 9 8        Hematology Last Labs      Recent Labs  Lab 12/03/20 1444  WBC 8.3  RBC 3.84*  HGB 13.3  HCT 40.2  MCV 104.7*  MCH 34.6*  MCHC 33.1  RDW 18.2*  PLT 99*      BNP Last Labs      Recent Labs  Lab 12/03/20 1444  BNP >4,500.0*       DDimer  Last Labs   No results for input(s): DDIMER in the last 168 hours.     Radiology     Imaging Results (Last 48 hours)  DG Chest Port 1V same Day  Result Date: 12/03/2020 CLINICAL DATA:  Chronic systolic heart failure EXAM: PORTABLE CHEST 1 VIEW COMPARISON:  August 06, 2020 FINDINGS: There is cardiomegaly with pulmonary vascularity normal. No adenopathy. No edema or airspace opacity. There is aortic atherosclerosis. There is an exostosis arising from the inferior aspect of the mid clavicle on the left. IMPRESSION: Cardiomegaly. No edema or airspace opacity. Pulmonary vascularity within normal limits. Aortic Atherosclerosis (ICD10-I70.0). Electronically Signed   By: Lowella Grip  III M.D.   On: 12/03/2020 16:22     Cardiac Studies   March 2022, 2 week monitor 1. Sinus Rhythm. First Degree AV Block was present - avg HR of 80 bpm. 2. Bundle Branch Block/IVCD was present.  3. 43 runs of nonsustained ventricular tachycardia occurred, the run with the  fastest interval lasting 6 beats with a max rate of 214 bpm, the longest lasting 14 beats with an avg rate of 162 bpm.  4. Atrial Fibrillation occurred (7% burden), ranging from 75-186 bpm (avg of 135 bpm), the longest lasting 11 hours 8 mins with an avg rate of 139 bpm.  5. Rare PACs 6. Occasional PVCs (3.4%, 47829)    10/21/20; PET/CT Impression:  1. Findings are suggestive of inflammatory process involving predominantly right atrium, 5/17 segments of the left ventricle and  to a lesser degree left atrium.  2. Coronary artery calcifications and aortic calcifications.  3. No evidence of metabolically active sarcoid outside of the heart.   RHC/LHC 07/2020-moderate non-obstructive CAD Echo 11/21LVEF 20-25%, G2DD, diffuse HK, moderately reduced RV CMRI 07/2020- LVEF 24%These findings are consistent with non-ischemic cardiomyopathy and are highly suspicious for diffuse cardiac sarcoidosis. ECV is very elevated at 45% (amyloid range, however no other findings suggestive of cardiac amyloidosis), however most probably represent diffuse scarring.   07/01/2020: TTE IMPRESSIONS  1. Left ventricular ejection fraction, by estimation, is20 to 25%. The  left ventricle has severely decreased function. The left ventricle  demonstrates global hypokinesis. The left ventricular internal cavity size  was moderately dilated. Left  ventricular diastolic parameters were normal.  2. Right ventricular systolic function is moderately reduced. The right  ventricular size is moderately enlarged. There is moderately elevated  pulmonary artery systolic pressure.  3. Left atrial size was mildly dilated.  4.  Degree of functional MR worse compared to echo done July 2019. The  mitral valve is normal in structure. Moderate mitral valve regurgitation.  No evidence of mitral stenosis.  5. The aortic valve is normal in structure. Aortic valve regurgitation is  mild. No aortic stenosis is present.  6. The inferior vena cava is dilated in size with <50% respiratory  variability,  suggesting right atrial pressure of 15 mmHg.   Patient Profile     75 y.o. male with PMHx of chronic CHF (combined), NICM, also mod reduced RVEF, HTN, CKD (III), former smoker (chest CT 11/21 showing bilateral pulmonary nodules and mild mediastinal and hilar adenopathy),PAD)s/p bifem bypass and aorto-iliac aneursym repair), AFib, PVCs, NSVT, RBBB (trifascicular block), cardiac sarcoid admitted with AFib RVR and acute/chronic CHF exacerbation  Assessment & Plan    1. Paroxysmal AFib CHA2DS2Vasc is 4, on Eliquis appropriately dosed, though was not taking it  Eliquis started here > held for RHC today and ICD implant tomorrow  He had spontaneous conversion to SR Dr.Jelissa Espiritu has seen and examined the patient this morning We have reviewed labs and EKG together K+ 4.4 Mag 2.2 Creat 2.08 (up from his baseline, discussed 278mcg dose, will continue), baeline Calc CrCl is 40's If creat continues to rise we may need to back off dose QTc stable/acceptable given wide RBBB    2. NICM 3. Acute/chronic CHF 4. Cardiacsarcoid BiVe failure HF team is on board      Planned for [place] today and CRT-D tomorrow Dr. Quentin Ore will implant tomorrow, he has seen and discussed rational for ICD implant, the  Procedure, potential risks and benefits with the patient, he is agreeable to proceed   4. Cardiac sarcoid (active) 5. PVCs, NSVT noted on out pt holter at baseline appreciate HF team contnued prednisone at the 30mg  dosing  NO bactrim given Tikosyn D/w pharmacy they are looking into perhaps  Dapsone Methotrexate started C/w AHF team   6. HTN relative hypotension with meds, looks ok   7. CKD  follow, up today     Diuretics with HF team      ----------------------------------------------------------------------------------  I have seen, examined the patient, and reviewed the above assessment and plan.    Patient s/p CRT-D on 12/06/2020. After restarting his anticoagulation he developed a hematoma in the left chest. Now presents for hematoma evacuation.  I have discussed the risks and expected recovery after pocket hematoma evacuation including bleeding, infection, vascular damage, renal failure, MI, stroke and death and wishes to proceed.   Vickie Epley, MD 12/27/2020 11:36 AM

## 2020-12-27 NOTE — Discharge Instructions (Signed)
Care After Leave pressure dressing in place until seen by your Dr. This sheet gives you information about how to care for yourself after your procedure. Your health care provider may also give you more specific instructions. If you have problems or questions, contact your health care provider. What can I expect after the procedure? After the procedure, it is common to have these symptoms at the site where the pacemaker was inserted:  Mild pain or soreness.  Slight bruising.  Some swelling over the incisions.  A slight bump over the skin where the device was placed (if it was implanted in the upper chest area). Sometimes, it is possible to feel the device under the skin. This is normal. Follow these instructions at home: Incision care  Keep the incision clean and dry for 2-3 days after the procedure or as told by your health care provider. It takes several weeks for the incision site to completely heal.  Do not remove the bandage (dressing) on your chest until told to do so by your health care provider.  Leave stitches (sutures), skin glue, or adhesive strips in place. These skin closures may need to stay in place for 2 weeks or longer. If adhesive strip edges start to loosen and curl up, you may trim the loose edges. Do not remove adhesive strips completely unless your health care provider tells you to do that.  Do not take baths, swim, or use a hot tub for 7-10 days or until your health care provider approves. Ask your health care provider if you may take showers. You may only be allowed to take sponge baths.  Pat the incision area dry with a clean towel. Do not rub the area. This may cause bleeding.  Check your incision area every day for signs of infection. Check for: ? More redness, swelling, or pain. ? Fluid or blood. ? Warmth. ? Pus or a bad smell.  Avoid putting pressure on the area where the pacemaker was placed. Women may want to place a small pad over the incision site to  protect it from their bra strap.   Medicines  Take over-the-counter and prescription medicines only as told by your health care provider.  If you were prescribed an antibiotic medicine, take it as told by your health care provider. Do not stop taking the antibiotic even if you start to feel better. Activity  For the first 2 weeks, or as long as told by your health care provider: ? Avoid lifting your left arm higher than your shoulder. ? Be gentle when you move your arms over your head. It is okay to raise your arm to comb your hair. ? Avoid exercise or activities that take a lot of effort.  Ask your health care provider when it is okay to: ? Return to your normal activities. ? Return to work or school. ? Resume sexual activity.  If you were given a medicine to help you relax (sedative) during the procedure, it can affect you for several hours. Do not drive or operate machinery until your health care provider says that it is safe. General instructions  Do not use any products that contain nicotine or tobacco, such as cigarettes, e-cigarettes, and chewing tobacco. These can delay incision healing after surgery. If you need help quitting, ask your health care provider.  Always let all health care providers, including dentists, know about your pacemaker before you have any medical procedures or tests.  You may be shown how to transfer data from  your pacemaker through the phone to your health care provider.  Wear a medical ID bracelet or necklace stating that you have a pacemaker, and carry a pacemaker ID card with you at all times.  Avoid close and prolonged exposure to electrical devices that have strong magnetic fields. These include: ? Airport Data processing manager. When at the airport, let officials know that you have a pacemaker. Carry your pacemaker ID card. ? Metal detectors. If you must pass through a metal detector, walk through it quickly. Do not stop under the detector or stand  near it.  When using your mobile phone, hold it to the ear opposite the pacemaker. Do not leave your mobile phone in a pocket over the pacemaker.  Your pacemaker battery will last for 5-15 years. Your health care provider will do routine checks to know when the battery is starting to run down. When this happens, the pacemaker will need to be replaced.  Keep all follow-up visits as told by your health care provider. This is important. Contact a health care provider if:  You have pain at the incision site that is not relieved by medicines.  You have any of these signs of infection: ? More redness, swelling, or pain around your incision. ? Fluid or blood coming from your incision. ? Warmth coming from your incision. ? Pus or a bad smell coming from your incision. ? A fever.  You feel brief, occasional palpitations, light-headedness, or any symptoms that you think might be related to your heart. Get help right away if:  You have chest pain that is different from the pain at the pacemaker site.  You develop a red streak that extends above or below the incision site.  You have shortness of breath.  You have palpitations or an irregular heartbeat.  You have light-headedness that does not go away quickly.  You faint or have dizzy spells.  Your pulse suddenly drops or increases rapidly and does not return to normal.  You gain weight and your legs and ankles swell. Summary  After the procedure, it is common to have pain, soreness, and some swelling or bruising where the pacemaker was inserted.  Keep your incision clean and dry. Follow instructions from your health care provider about how to take care of your incision.  Check your incision every day for signs of infection, such as more pain or swelling, pus or a bad smell, warmth, or leaking fluid or blood.  Carry a pacemaker ID card with you at all times. This information is not intended to replace advice given to you by your  health care provider. Make sure you discuss any questions you have with your health care provider. Document Revised: 07/13/2019 Document Reviewed: 07/13/2019 Elsevier Patient Education  2021 [employer].

## 2020-12-30 ENCOUNTER — Encounter (HOSPITAL_COMMUNITY): Payer: Self-pay | Admitting: Cardiology

## 2020-12-30 MED FILL — Gentamicin Sulfate Inj 40 MG/ML: INTRAMUSCULAR | Qty: 80 | Status: AC

## 2020-12-30 MED FILL — Lidocaine HCl Local Inj 1%: INTRAMUSCULAR | Qty: 60 | Status: AC

## 2021-01-02 ENCOUNTER — Other Ambulatory Visit: Payer: Self-pay

## 2021-01-02 ENCOUNTER — Encounter (HOSPITAL_COMMUNITY): Payer: Self-pay | Admitting: Internal Medicine

## 2021-01-02 ENCOUNTER — Ambulatory Visit (HOSPITAL_COMMUNITY)
Admission: RE | Admit: 2021-01-02 | Discharge: 2021-01-02 | Disposition: A | Discharge status: Home / Self Care | Payer: Medicare Other | Source: Ambulatory Visit | Attending: Internal Medicine | Admitting: Internal Medicine

## 2021-01-02 ENCOUNTER — Ambulatory Visit (INDEPENDENT_AMBULATORY_CARE_PROVIDER_SITE_OTHER): Payer: Medicare Other | Admitting: Emergency Medicine

## 2021-01-02 ENCOUNTER — Ambulatory Visit (INDEPENDENT_AMBULATORY_CARE_PROVIDER_SITE_OTHER): Payer: Medicare Other | Admitting: Medical

## 2021-01-02 ENCOUNTER — Encounter: Payer: Self-pay | Admitting: Medical

## 2021-01-02 VITALS — BP 110/70 | HR 72 | Wt 146.6 lb

## 2021-01-02 VITALS — BP 110/70 | HR 72 | Temp 98.0°F | Ht 70.0 in | Wt 147.2 lb

## 2021-01-02 DIAGNOSIS — D8689 Sarcoidosis of other sites: Secondary | ICD-10-CM | POA: Diagnosis not present

## 2021-01-02 DIAGNOSIS — Z79899 Other long term (current) drug therapy: Secondary | ICD-10-CM | POA: Insufficient documentation

## 2021-01-02 DIAGNOSIS — H189 Unspecified disorder of cornea: Secondary | ICD-10-CM | POA: Diagnosis not present

## 2021-01-02 DIAGNOSIS — D8685 Sarcoid myocarditis: Secondary | ICD-10-CM | POA: Diagnosis not present

## 2021-01-02 DIAGNOSIS — Z7952 Long term (current) use of systemic steroids: Secondary | ICD-10-CM | POA: Insufficient documentation

## 2021-01-02 DIAGNOSIS — Z7189 Other specified counseling: Secondary | ICD-10-CM | POA: Diagnosis not present

## 2021-01-02 DIAGNOSIS — I429 Cardiomyopathy, unspecified: Secondary | ICD-10-CM

## 2021-01-02 DIAGNOSIS — I739 Peripheral vascular disease, unspecified: Secondary | ICD-10-CM | POA: Diagnosis not present

## 2021-01-02 DIAGNOSIS — I4819 Other persistent atrial fibrillation: Secondary | ICD-10-CM | POA: Diagnosis not present

## 2021-01-02 DIAGNOSIS — R7303 Prediabetes: Secondary | ICD-10-CM | POA: Insufficient documentation

## 2021-01-02 DIAGNOSIS — Z7984 Long term (current) use of oral hypoglycemic drugs: Secondary | ICD-10-CM | POA: Diagnosis not present

## 2021-01-02 DIAGNOSIS — Z7982 Long term (current) use of aspirin: Secondary | ICD-10-CM | POA: Insufficient documentation

## 2021-01-02 DIAGNOSIS — D869 Sarcoidosis, unspecified: Secondary | ICD-10-CM | POA: Insufficient documentation

## 2021-01-02 DIAGNOSIS — Z87891 Personal history of nicotine dependence: Secondary | ICD-10-CM | POA: Diagnosis not present

## 2021-01-02 DIAGNOSIS — I472 Ventricular tachycardia: Secondary | ICD-10-CM | POA: Insufficient documentation

## 2021-01-02 DIAGNOSIS — T82837A Hemorrhage of cardiac prosthetic devices, implants and grafts, initial encounter: Secondary | ICD-10-CM

## 2021-01-02 DIAGNOSIS — Z95810 Presence of automatic (implantable) cardiac defibrillator: Secondary | ICD-10-CM | POA: Insufficient documentation

## 2021-01-02 DIAGNOSIS — E785 Hyperlipidemia, unspecified: Secondary | ICD-10-CM | POA: Diagnosis not present

## 2021-01-02 DIAGNOSIS — I493 Ventricular premature depolarization: Secondary | ICD-10-CM

## 2021-01-02 DIAGNOSIS — I428 Other cardiomyopathies: Secondary | ICD-10-CM | POA: Insufficient documentation

## 2021-01-02 DIAGNOSIS — R948 Abnormal results of function studies of other organs and systems: Secondary | ICD-10-CM | POA: Diagnosis not present

## 2021-01-02 DIAGNOSIS — N1831 Chronic kidney disease, stage 3a: Secondary | ICD-10-CM

## 2021-01-02 DIAGNOSIS — R911 Solitary pulmonary nodule: Secondary | ICD-10-CM | POA: Diagnosis not present

## 2021-01-02 DIAGNOSIS — I5082 Biventricular heart failure: Secondary | ICD-10-CM | POA: Insufficient documentation

## 2021-01-02 DIAGNOSIS — I130 Hypertensive heart and chronic kidney disease with heart failure and stage 1 through stage 4 chronic kidney disease, or unspecified chronic kidney disease: Secondary | ICD-10-CM | POA: Diagnosis not present

## 2021-01-02 DIAGNOSIS — I2510 Atherosclerotic heart disease of native coronary artery without angina pectoris: Secondary | ICD-10-CM | POA: Diagnosis not present

## 2021-01-02 DIAGNOSIS — I5022 Chronic systolic (congestive) heart failure: Secondary | ICD-10-CM | POA: Diagnosis not present

## 2021-01-02 LAB — POCT GLYCOSYLATED HEMOGLOBIN (HGB A1C): Hemoglobin A1C: 6.1 % — AB (ref 4.0–5.6)

## 2021-01-02 NOTE — Patient Instructions (Signed)
Keep steri-strips in place clean and dry. Call the device clinic if you have any increased swelling, any drainage, the wound becomes hot to touch or if you develop a fever or chills.

## 2021-01-02 NOTE — Progress Notes (Signed)
ADVANCED HF CLINIC NOTE   PCP: Carlena Hurl, PA-C Primary HF Cardiologist: Dr Haroldine Laws   HPI: [name] is a 75 y/o with history of  chronic systolic heart failure w/ biventricular dysfunction. Notes indicate he has a NICM.  Echos dating back to 2008 have shown chronic systolic heart failure, LVEF has been as low as 20%. Most recent echo 11/21 showed EF 20-25%, diffuse HK, G2DD, w/ moderately reduced RV systolic function and moderate Robert. Other PMH includes HTN, Stage III CKD (baseline SCr ~1.5), PAD s/p bifem bypass and aorto-iliac aneursym repair, tobacco use, and abnormal chest CT 11/21 showing bilateral pulmonary nodules and mild mediastinal and hilar adenopathy.Quit smoking 2021.   Admitted 11/21 for a/c CHF and diuresed w/ IV Lasix.   Admitted in 12/21 with ADHF. HF meds adjusted to GDMT. Had cath that showed moderate non obstructive CAD. EF remained < 35%.   Admitted 4/22 for tikosyn load for AF but QT prolonged. Started on amio.   RHC 4/22  RA = 1 RV = 31/2 PA = 30/15 (20) PCW = 13 Fick cardiac output/index = 3.9/2.1 PVR = 1.9 WU Ao sat = 98% PA sat =62%, 64%  Underwent CRT-D insertion. 12/05/20. Found to have device pocket hematoma and on 12/27/20 underwent hematoma evacuation and pocket revision. Started on doxy 100 bid.   Repeat PET [facility] 12/27/20 EF 13% diffuse uptake despite MTX and prednisone. Saw Dr. Weyman Croon who fely he may need cardiac bx to confirm dx.   Here for f/u. ICD site much better. Feels ok. Denies SOB, orthopnea or PND. Taking prednisone 30 daily and MTX. No fevers or chills.    Cardiac Testing   10/21/20; PET/CT Impression:  1. Findings are suggestive of inflammatory process involving predominantly right atrium, 5/17 segments of the left ventricle and  to a lesser degree left atrium.  2. Coronary artery calcifications and aortic calcifications.  3. No evidence of metabolically active sarcoid outside of the heart.  RHC/LHC 07/2020  -moderate non-obstructive CAD  Echo 11/21 LVEF 20-25%, G2DD, diffuse HK, moderately reduced RV  CMRI 07/2020  - LV EF 24% These findings are consistent with non-ischemic cardiomyopathy and are highly suspicious for diffuse cardiac sarcoidosis. ECV is very elevated at 45% (amyloid range, however no other findings suggestive of cardiac amyloidosis), however most probably represent diffuse scarring.   ROS: All systems negative except as listed in HPI, PMH and Problem List.  SH:  Social History   Socioeconomic History  . Marital status: Single    Spouse name: Not on file  . Number of children: 5  . Years of education: high school graduate with 3 years of college  . Highest education level: Some college, no degree  Occupational History  . 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 Smoking status: Former Smoker    Quit date: 03/08/2008    Years since quitting: 12.8  . Smokeless tobacco: Never Used  Vaping Use  . Vaping Use: Never used  Substance and Sexual Activity  . Alcohol use: No  . Drug use: No  . Sexual activity: Not on file  Other Topics Concern  . Not on file  Social History Narrative   single male who lives alone but with the support of his daughter [name]. He is doing well with his care needs    He works in [employer] part time.   high school graduate with 3 years of college  5 children    Male pit bull in home- [place]   Social Determinants of Health   Financial Resource Strain: Medium Risk  . Difficulty of Paying Living Expenses: Somewhat hard  Food Insecurity: No Food Insecurity  . Worried About Charity fundraiser in the Last Year: Never true  . Ran Out of Food in the Last Year: Never true  Transportation Needs: No Transportation Needs  . Lack of Transportation (Medical): No  . Lack of Transportation (Non-Medical): No  Physical Activity: Not on file  Stress: No Stress Concern Present  . Feeling of Stress :  Only a little  Social Connections: Not on file  Intimate Partner Violence: Not At Risk  . Fear of Current or Ex-Partner: No  . Emotionally Abused: No  . Physically Abused: No  . Sexually Abused: No    FH:  Family History  Problem Relation Age of Onset  . Diabetes Mother   . Diabetes Sister   . Diabetes Brother     Past Medical History:  Diagnosis Date  . ABDOMINAL AORTIC ANEURYSM   . CAD    Non obstructive (mild) cath 2007  . CHF    EF 15% in 2007, 25% currently  . GLAUCOMA   . HYPERTENSION   . TOBACCO ABUSE    Previous    Current Outpatient Medications  Medication Sig Dispense Refill  . amiodarone (PACERONE) 200 MG tablet Take 1 tablet (200 mg) twice daily for 2 weeks, then reduce to 1 tablet (200 mg) once a day. (Patient taking differently: Take 200 mg by mouth daily.) 42 tablet 3  . amiodarone (PACERONE) 200 MG tablet Take 200 mg by mouth daily.    Marland Kitchen aspirin 81 MG EC tablet Take 1 tablet (81 mg total) by mouth daily. 30 tablet 11  . atorvastatin (LIPITOR) 40 MG tablet Take 40 mg by mouth daily.    . cholecalciferol (VITAMIN D) 1000 UNITS tablet Take 1,000 Units by mouth 2 (two) times daily.     . dapagliflozin propanediol (FARXIGA) 10 MG TABS tablet Take 1 tablet (10 mg total) by mouth daily. (Patient taking differently: Take 5 mg by mouth daily.) 90 tablet 3  . doxycycline (VIBRA-TABS) 100 MG tablet Take 1 tablet (100 mg total) by mouth every 12 (twelve) hours for 10 days. 20 tablet 0  . empagliflozin (JARDIANCE) 25 MG TABS tablet Take 25 mg by mouth daily.    . methotrexate (RHEUMATREX) 2.5 MG tablet Take 4 tablets (10 mg total) by mouth once a week. 24 tablet 3  . metoprolol succinate (TOPROL-XL) 50 MG 24 hr tablet Take 50 mg by mouth daily.    . predniSONE (DELTASONE) 10 MG tablet Take 3 tablets (30 mg total) by mouth daily. (Patient taking differently: Take 10 mg by mouth daily.) 90 tablet 11  . spironolactone (ALDACTONE) 25 MG tablet Take 1 tablet (25 mg total) by  mouth daily. 90 tablet 3  . [START ON 01/08/2021] sulfamethoxazole-trimethoprim (BACTRIM DS) 800-160 MG tablet Take 1 tablet by mouth 3 (three) times a week. 12 tablet 3   No current facility-administered medications for this encounter.    Vitals:   01/02/21 1416  BP: 110/70  Pulse: 72  SpO2: 99%  Weight: 66.5 kg (146 lb 9.6 oz)   Wt Readings from Last 3 Encounters:  01/02/21 66.8 kg (147 lb 3.2 oz)  12/27/20 66.7 kg (147 lb)  12/13/20 69.1 kg (152 lb 6.4 oz)    PHYSICAL EXAM: General:  Well appearing. No  resp difficulty HEENT: normal Neck: supple. no JVD. Carotids 2+ bilat; no bruits. No lymphadenopathy or thryomegaly appreciated. Cor: PMI nondisplaced. Regular rate & rhythm. No rubs, gallops or murmurs. Lungs: clear Abdomen: soft, nontender, nondistended. No hepatosplenomegaly. No bruits or masses. Good bowel sounds. Extremities: no cyanosis, clubbing, rash, edema Neuro: alert & orientedx3, cranial nerves grossly intact. moves all 4 extremities w/o difficulty. Affect pleasant    EKG: SR 61 bpm RBBB  146 ms   ASSESSMENT & PLAN:  1. Chronic Combined Systolic and Biventricular Heart Failure in setting of cardiac sarcoidosis -dates back to at least 2007, EF chronically in 20-35% range. - Most recent echo 11/21 LVEF 20-25%, G2DD, diffuse HK, moderately reduced RV - cMRI suggestive of sarcoid. EF 24% - s/p ICD 4/22 - NICM. [name] 4/22 w/ mod no-obs CAD, elevated filling pressures and CI 1.9  - PET scan at [facility] 12/27/20 EF 13% diffuse FDG uptake despite MTX and prednisone - NYHA II. Functional improvement. Volume status stable. Continue lasix 40 mg daily.  - Continue Entresto 97-103 - Continue Toprol XL 50 mg daily  - Continue Spiro 25 mg daily  - Continue Farxiga 10 mg daily - I d/w with Dr. Weyman Croon at [name]. Given ongoing FDG uptake despite prednisone and MTX will plan endomyocardial biopsy to confirm diagnosis    2. CAD  - LHC 4/22 w/ mod non-obs CAD - no s/s angina  -  Continue ASA + ? blocker - Intolerant atorvastatin due to blurred vision.   - Will need to refer to [facility] for repatha.   3.Stage IIIaCKD - Baseline SCr ~1.4-1.5 - Stable on recent labs  4. PVCs/NSVT - cath w/ non obs CAD  - Continue Toprol 50 mg daily  5. PAD -s/p bifem bypass [facility]  - continue ASA  - Intolerant statin.   6. ICD site hematoma - s/p revision 12/27/20 - site looks good    Glori Bickers MD  2:07 PM

## 2021-01-02 NOTE — Progress Notes (Signed)
Subjective:  [name] is a 75 y.o. male who presents for Chief Complaint  Patient presents with  . New Patient (Initial Visit)     New patient today to establish care.  He routinely sees at the [facility] in [place], [place] but also wants to have a local primary care office.  He has had recent hospitalizations in December, April and May regarding his heart.  Medical team: Dr. Virl Axe, Dr. Glori Bickers, Dr. Minus Breeding, Doristine Devoid, NP [facility] for chronic disease, medications, diabetes  No current nephrologist, no endocrinologist, no pulmonologist, no rheumatologist  Was recently hospitalized in April and May for afib, and had defibrillator installed.  Had infection, had to go back and have repeated surgery due to injection.  Was on blood thinner but this was stopped with the recent bleeding and complication from surgery for defibrillator and subsequent infection.  He notes diabetes is a new diagnosis.   This morning blood sugar 109, and sugars have been running in this range this week.  He notes originally being put on a medicaiton in March, but this over worked his kidneys so it was discontinued.   Denies polyuria ,no polydipsia.   No blurred vision.   He notes with the recent hospitalizations he has had several medication changes.  He has a list of his medications today.  No other particular concerns today.  In review of his medications, he states he is not sure why he is on methotrexate or prednisone.    Next follow up is with Dr. [name] next Wednesday and sees Dr. Haroldine Laws today this afternoon.    Eats about 3 times per day.   Water intake is good.   He is weighing daily.  No fluctuation this week, around 147lb stable.    Lives at home alone.  Closest relative is daughter Kvon Mcilhenny, and she lives here in [place].  He has not done advanced directives.    He is a smoker but has cut down over time.  Has 35 pack year history..    Lately smokes about 1-2 black and milds per daily.   No other aggravating or relieving factors.    No other c/o.  Past Medical History:  Diagnosis Date  . ABDOMINAL AORTIC ANEURYSM   . CAD    Non obstructive (mild) cath 2007  . CHF    EF 15% in 2007, 25% currently  . GLAUCOMA   . HYPERTENSION   . TOBACCO ABUSE    Previous   Current Outpatient Medications on File Prior to Visit  Medication Sig Dispense Refill  . aspirin 81 MG EC tablet Take 1 tablet (81 mg total) by mouth daily. 30 tablet 11  . atorvastatin (LIPITOR) 40 MG tablet Take 40 mg by mouth daily.    . cholecalciferol (VITAMIN D) 1000 UNITS tablet Take 1,000 Units by mouth 2 (two) times daily.     . empagliflozin (JARDIANCE) 25 MG TABS tablet Take 25 mg by mouth daily.    . methotrexate (RHEUMATREX) 2.5 MG tablet Take 4 tablets (10 mg total) by mouth once a week. 24 tablet 3  . metoprolol succinate (TOPROL-XL) 50 MG 24 hr tablet Take 50 mg by mouth daily.    Marland Kitchen spironolactone (ALDACTONE) 25 MG tablet Take 1 tablet (25 mg total) by mouth daily. 90 tablet 3  . sulfamethoxazole-trimethoprim (BACTRIM DS) 800-160 MG tablet Take 1 tablet by mouth 3 (three) times a week. 12 tablet 3  . amiodarone (PACERONE) 200 MG  tablet Take 200 mg by mouth daily.    . predniSONE (DELTASONE) 10 MG tablet Take 10 mg by mouth in the morning, at noon, and at bedtime.     No current facility-administered medications on file prior to visit.     The following portions of the patient's history were reviewed and updated as appropriate: allergies, current medications, past family history, past medical history, past social history, past surgical history and problem list.  ROS Otherwise as in subjective above    Objective: BP 110/70   Pulse 72   Temp 98 F (36.7 C)   Ht 5\' 10"  (1.778 m)   Wt 147 lb 3.2 oz (66.8 kg)   SpO2 99%   BMI 21.12 kg/m   Wt Readings from Last 3 Encounters:  01/08/21 145 lb 3.2 oz (65.9 kg)  01/03/21 147 lb (66.7  kg)  01/02/21 146 lb 9.6 oz (66.5 kg)    BP Readings from Last 3 Encounters:  01/08/21 106/60  01/02/21 110/70  01/02/21 110/70    General appearance: alert, no distress, well developed, well nourished, African-American male He has a surgical scar and Steri-Strips over wound on the left upper chest from recent defibrillator surgery.  He is still tender in this area but no obvious redness or induration or warmth. Right cornea cloudy and hazy with a bluish hue that he knows this is been the case long-term for years, multiple prior corneal surgeries Oral cavity: MMM, no lesions Neck: supple, no lymphadenopathy, no thyromegaly, no masses, no JVD Heart: Somewhat regular with occasional skipped beat otherwise normal S1, S2, no murmurs Lungs: CTA bilaterally, no wheezes, rhonchi, or rales Pulses: 2+ radial pulses, 2+ pedal pulses, normal cap refill Ext: no edema     Assessment: Encounter Diagnoses  Name Primary?  . Persistent atrial fibrillation ([place]) Yes  . Cardiomyopathy, unspecified type ([place])   . Chronic kidney disease, stage 3a ([place])   . Dyslipidemia   . Advanced directives, counseling/discussion   . Prediabetes   . Pulmonary nodule   . Corneal abnormality   . Abnormal positron emission tomography (PET) scan      Plan: I reviewed his recent hospitalization reports from December 2021, April and May 2022.  Medications were reconciled although I am still reviewing methotrexate and prednisone prescriptions.  I reviewed recent imaging and lab notes.  His main question today has to do with diabetes.  From what I can tell through his recent records he is prediabetic.  His hemoglobin A1c was 6.1% today.  He notes blood sugar this morning fasting was 109.  We discussed diagnosis of diabetes, criteria for diabetes, need for healthy low sugar diet, good water intake, avoiding high sugar foods and regular follow-up.    Dyslipidemia- on statin, compliant  Cardiomyopathy, persistent  atrial fibrillation - follow up with cardiology as scheduled  Sarcoidosis- questionable cardiac sarcoidosis, hospital notes had explored this as a possible diagnosis, but imaging suggested otherwise.  F/u with cardiology.  Pulmonary nodule-multiple nodules seen on CT chest November 2021.   This will require some folowup pending cardiology and any subsequent imaging they may do.   PET was recommended as an option which would require considerable expense and insurance clearance.   Will discuss further after his upcoming cardiology appt  CKD-avoid NSAIDs, work on good hydration, continue to monitor  Corneal abnormality-he notes this is chronic and has had multiple surgeries.  Advised to continue to follow-up with eye doctor regularly  Advanced directive discussion -we discussed updating his  advanced directives particular his healthcare power of attorney since he does not have any of these forms done.  He does have a daughter that lives here locally in [place] that would likely be his healthcare power of attorney.    Prentis was seen today for new patient (initial visit).  Diagnoses and all orders for this visit:  Persistent atrial fibrillation (HCC)  Cardiomyopathy, unspecified type ([place])  Chronic kidney disease, stage 3a ([place])  Dyslipidemia  Advanced directives, counseling/discussion  Prediabetes -     HgB A1c  Pulmonary nodule  Corneal abnormality  Abnormal positron emission tomography (PET) scan    Follow up: pending call back

## 2021-01-02 NOTE — Progress Notes (Signed)
Pressure dressing removed and occlusive dressing removed. Steri - strips dry and intact. Bruising present around device and in left chest an axillary region. No drainage or bleeding from wound site. Wound site has small amount of edema. Patient has follow-up with Dr [name] on 01/08/21. Education done on s/sx of infection and provided patient with device clinic # for questions , concerns or change in wound site. Photo taken with patient permission.

## 2021-01-02 NOTE — Patient Instructions (Signed)
[facility] will contact you about the Heart Biopsy  Your physician recommends that you schedule a follow-up appointment in: 4 months  If you have any questions or concerns before your next appointment please send [place] a message through [place] or call our office at [phone].    TO LEAVE A MESSAGE FOR THE NURSE SELECT OPTION 2, PLEASE LEAVE A MESSAGE INCLUDING: . YOUR NAME . DATE OF BIRTH . CALL BACK NUMBER . REASON FOR CALL**this is important as we prioritize the call backs  [place] AS LONG AS YOU CALL BEFORE 4:00 PM  At the [facility], you and your health needs are our priority. As part of our continuing mission to provide you with exceptional heart care, we have created designated Provider Care Teams. These Care Teams include your primary Cardiologist (physician) and Advanced Practice Providers (APPs- Physician Assistants and Nurse Practitioners) who all work together to provide you with the care you need, when you need it.   You may see any of the following providers on your designated Care Team at your next follow up: Marland Kitchen Dr Glori Bickers . Dr Loralie Champagne . Dr Vickki Muff . Darrick Grinder, NP . Lyda Jester, [place] . Audry Riles, PharmD   Please be sure to bring in all your medications bottles to every appointment.

## 2021-01-03 ENCOUNTER — Other Ambulatory Visit: Payer: Self-pay | Admitting: *Deleted

## 2021-01-03 ENCOUNTER — Ambulatory Visit: Payer: Self-pay | Admitting: *Deleted

## 2021-01-03 NOTE — Patient Outreach (Signed)
[facility]) Care Management  01/03/2021  [name] July 02, 1946 [id number]   [facility] outreachto complex care patient Discharge from [facility] on [place] Day #1 Monday 12/09/20 1706  Red alert reason Scheduled a follow-up appointment? I Don't Know  And  Day #4Date: Thursday 08/15/20 1003 Red Alert Reason: Know who to call about changes in condition? No  scheduled follow-up? No  Other questions/problems? yes  Insurance:NextGen MedicareVA benefit [facility] x2ED visits x 2in the last 6 months Last admission 12/24/20 ED visit as [name] [facility], [facility]  Dr Jeri Cos BP 131/78 concern for pocket infection at defibrillator site 12/03/20 -12/07/20 atrial fibrillation (for a week) -had not taken Eliquis -Was direct admitted for [place], but Tikosyn D/C'ed due to prolonged QT 08/06/20 to 12/18/21Hypertension (HTN), monomorphic ventricular tachycardia,Chronic Kidney disease (CKD)stage 3 a, acute on chronic heart failure atypical chest pain, acute respiratory failure with hypoxia secondary to heart failure   Patient is able to verify HIPAA ([place] and [place]) identifiers Reviewed and addressed the purpose of the follow up call with the patient  Consent: [facility] RN CM reviewed [facility] services with patient. Patient gave verbal consent for services.   Follow up assessment [name] reports overall he is doing well  He reports completing a left chest pocket wound evacuation on 12/27/20 and 2 appointments on 01/02/21 to establish care with the advanced heart failure clinic and with local pcp D Tysinger, PA The left chest wound is healing but continues with some edema To follow up with Dr [name] on 01/08/21 He reports his only concern was related to "biopsy" to be ordered by r [place], HF clinic to verify diagnosis of cardiac sarcoidosis [facility] RN CM also discussed his  referral to the lipid clinic and the purpose Cardiac team now includes Dr. Virl Axe, Dr. Glori Bickers, Dr. Minus Breeding, Roderic Palau, NP   Diabetes reported to be managed at home 100's Daily weights (wt) around 147 lbs   Past Medical History:  Diagnosis Date  . ABDOMINAL AORTIC ANEURYSM   . CAD    Non obstructive (mild) cath 2007  . CHF    EF 15% in 2007, 25% currently  . GLAUCOMA   . HYPERTENSION   . TOBACCO ABUSE    Previous   Patient Active Problem List   Diagnosis Date Noted  . Advanced directives, counseling/discussion 01/02/2021  . Prediabetes 01/02/2021  . Sarcoidosis 01/02/2021  . Pulmonary nodule 01/02/2021  . Corneal abnormality 01/02/2021  . Abnormal positron emission tomography (PET) scan 01/02/2021  . Bullous keratopathy 11/28/2020  . Type 2 diabetes mellitus without complications ([place]) 06/17/8526  . Diabetes mellitus without complication ([place]) 78/24/2353  . Primary open-angle glaucoma, right eye, severe stage 11/28/2020  . Myopia, bilateral 11/28/2020  . Presbyopia 11/28/2020  . Unspecified atrial fibrillation ([place]) 11/28/2020  . Encounter for preprocedural laboratory examination 11/28/2020  . Encounter for immunization 11/28/2020  . Dietary counseling and surveillance 11/28/2020  . Other specified counseling 11/28/2020  . Other specified health status 11/28/2020  . Erectile dysfunction 11/28/2020  . Encounter for therapeutic drug level monitoring 11/28/2020  . Hypertrophy of prostate without urinary obstruction and other lower urinary tract symptoms (LUTS) 11/28/2020  . 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 Closed fracture of ankle 11/28/2020  . Corneal ulcer 11/28/2020  . Cystoid macular edema 11/28/2020  . Depression 11/28/2020  . Lens replaced by other means 11/28/2020  . Other and unspecified hyperlipidemia 11/28/2020  . Pain in  joint 11/28/2020  . Purulent endophthalmitis 11/28/2020  . Syncope and collapse 11/28/2020  . Vitamin D  deficiency 11/28/2020  . Other specified glaucoma 10/23/2020  . Other age-related cataract 10/23/2020  . Former smoker 10/22/2020  . Atypical chest pain 08/07/2020  . Elevated troponin 08/07/2020  . Acute on chronic systolic (congestive) heart failure ([place]) 08/06/2020  . Acute on chronic combined systolic and diastolic CHF (congestive heart failure) ([place]) 07/01/2020  . Acute on chronic respiratory failure with hypoxia ([place]) 07/01/2020  . Chronic kidney disease, stage 3a ([place]) 07/01/2020  . Thrombocytopenia ([place]) 07/01/2020  . Positive D dimer 07/01/2020  . Acute hypoxemic respiratory failure ([place])   . PVD (peripheral vascular disease) ([place]) 08/29/2019  . Educated about COVID-19 virus infection 08/29/2019  . Bradycardia 09/05/2018  . Dyslipidemia 09/05/2018  . AAA (abdominal aortic aneurysm) without rupture ([place]) 09/05/2018  . Heart block 03/10/2018  . Cardiomyopathy ([place]) 10/29/2017  . TOBACCO ABUSE 11/30/2008  . CHRONIC SYSTOLIC HEART FAILURE 64/33/2951  . Peripheral arterial occlusive disease ([place]) 11/30/2008  . GLAUCOMA 11/29/2008  . Essential (primary) hypertension 11/29/2008  . Coronary atherosclerosis 11/29/2008  . Monomorphic ventricular tachycardia ([place]) 11/29/2008  . Unspecified systolic (congestive) heart failure ([place]) 11/29/2008   Current Outpatient Medications on File Prior to Visit  Medication Sig Dispense Refill  . amiodarone (PACERONE) 200 MG tablet Take 200 mg by mouth daily.    Marland Kitchen amiodarone (PACERONE) 200 MG tablet Take 200 mg by mouth daily.    Marland Kitchen aspirin 81 MG EC tablet Take 1 tablet (81 mg total) by mouth daily. 30 tablet 11  . atorvastatin (LIPITOR) 40 MG tablet Take 40 mg by mouth daily.    . cholecalciferol (VITAMIN D) 1000 UNITS tablet Take 1,000 Units by mouth 2 (two) times daily.     . dapagliflozin propanediol (FARXIGA) 10 MG TABS tablet Take 1 tablet (10 mg total) by mouth daily. 90 tablet 3  . doxycycline (VIBRA-TABS) 100 MG tablet Take 1 tablet (100  mg total) by mouth every 12 (twelve) hours for 10 days. 20 tablet 0  . empagliflozin (JARDIANCE) 25 MG TABS tablet Take 25 mg by mouth daily.    . methotrexate (RHEUMATREX) 2.5 MG tablet Take 4 tablets (10 mg total) by mouth once a week. 24 tablet 3  . metoprolol succinate (TOPROL-XL) 50 MG 24 hr tablet Take 50 mg by mouth daily.    . predniSONE (DELTASONE) 10 MG tablet Take 10 mg by mouth in the morning, at noon, and at bedtime.    Marland Kitchen spironolactone (ALDACTONE) 25 MG tablet Take 1 tablet (25 mg total) by mouth daily. 90 tablet 3  . [START ON 01/08/2021] sulfamethoxazole-trimethoprim (BACTRIM DS) 800-160 MG tablet Take 1 tablet by mouth 3 (three) times a week. 12 tablet 3   No current facility-administered medications on file prior to visit.    [facility] RN CM will follow up with [name] within the next 14-21 business days Pt encouraged to return a call to [facility] RN CM prn Goals Addressed              This Visit's Progress     Patient Stated   .  [facility]) Monitor and Manage My Blood Sugar-Diabetes Type 2 (pt-stated)   On track     Timeframe:  Short-Term Goal Priority:  Medium Start Date:                      11/28/20       Expected End  Date:     6/1//22                  Follow Up Date 01/22/21   - check blood sugar at prescribed times - check blood sugar if I feel it is too high or too low - enter blood sugar readings and medication or insulin into daily log     Notes:  01/03/21  reported to be managed at home 100's 12/26/20 cbg value 108 voiced understood about DM sick expectations and home care  Has all Dm medication issues resolved via [place] 11/28/20 checking cbg every Thursday per pt. Last check on 11/21/20 =86, not checked today 11/28/20    .  [facility]) Track and Manage Symptoms-Heart Failure (pt-stated)   On track     Timeframe:  Short-Term Goal Priority:  Medium Start Date:         08/30/20                    Expected End Date:     01/22/21                   Follow up dated 01/22/21   -  develop a rescue plan - know when to call the doctor - track symptoms and what helps feel better or worse    Notes:  01/03/21 daily weights around 147 lbs  12/26/20 12/25/20 confirmed infection of defib site. Saw MD 12/26/20 planned pending procedure 12/27/20 11/28/20  No worsening CHF symptoms        Whitney Bingaman L. Lavina Hamman, RN, BSN, [place] Coordinator Office number [phone] [facility] [place] number [phone] Fax number [phone]

## 2021-01-08 ENCOUNTER — Ambulatory Visit (INDEPENDENT_AMBULATORY_CARE_PROVIDER_SITE_OTHER): Payer: Medicare Other | Admitting: Internal Medicine

## 2021-01-08 ENCOUNTER — Other Ambulatory Visit: Payer: Self-pay

## 2021-01-08 ENCOUNTER — Encounter: Payer: Self-pay | Admitting: Internal Medicine

## 2021-01-08 VITALS — BP 106/60 | HR 73 | Ht 70.0 in | Wt 145.2 lb

## 2021-01-08 DIAGNOSIS — Z79899 Other long term (current) drug therapy: Secondary | ICD-10-CM

## 2021-01-08 DIAGNOSIS — I4891 Unspecified atrial fibrillation: Secondary | ICD-10-CM | POA: Diagnosis not present

## 2021-01-08 DIAGNOSIS — I2510 Atherosclerotic heart disease of native coronary artery without angina pectoris: Secondary | ICD-10-CM

## 2021-01-08 DIAGNOSIS — I10 Essential (primary) hypertension: Secondary | ICD-10-CM | POA: Diagnosis not present

## 2021-01-08 DIAGNOSIS — I429 Cardiomyopathy, unspecified: Secondary | ICD-10-CM

## 2021-01-08 DIAGNOSIS — I5022 Chronic systolic (congestive) heart failure: Secondary | ICD-10-CM | POA: Diagnosis not present

## 2021-01-08 NOTE — Progress Notes (Signed)
Patient Care Team: Tysinger, Camelia Eng, PA-C as PCP - General (Family Medicine) Minus Breeding, MD as PCP - Cardiology (Cardiology) Deboraha Sprang, MD as PCP - Electrophysiology (Cardiology) Minus Breeding, MD as Consulting Physician (Cardiology) Minus Breeding, MD as Consulting Physician (Cardiology) Barbaraann Faster, RN as [place] Management Hiram Gash, MD as Referring Physician (Cardiology) Sherran Needs, NP as Nurse Practitioner (Nurse Practitioner)   HPI  [name] is a 75 y.o. male seen in follow-up for consideration of an ICD for primary prevention in the setting of nonischemic cardiomyopathy and sarcoid-suspected based on cMRI as well as abnormal PET currently managed at [facility] with prednisone with anticipation of repeat imaging and initiation of methotrexate.  On guideline directed medical therapy with persistence of left ventricular dysfunction 3/22 had presented with atrial fibrillation with a rapid rate and decompensated heart failure.  Elected to proceed with TEE guided cardioversion; had reverted to sinus.  Then went back into atrial fibrillation admitted for Tikosyn but was intolerant with excessive QT prolongation.  He was started on amiodarone.  No interval atrial fibrillation.  Underwent ICD implantation during hospitalization 5/09 complicated by pocket hematoma with blood extravasation noted at [facility] and pocket evacuation accomplished here by Dr. [name] Perch 12/27/2020.  Report was reviewed.  No evidence of infection; antimicrobial pouch was used   No nausea.  No cough.  Mild dyspnea on exertion without chest pain or edema.  Seen at [facility] and is now on double therapy with prednisone and methotrexate  DATE TEST EF   2008 Echo 35-40%   5/13 Echo  30-35%   7/19 Echo  20-25%   12/21 cMRI 24% Concerning Sarcoid  12/21 LHC   CAD mod nonobst  3/26 PET  Metabolic active sarcoid   Date Cr K Hgb TSH AST  3/22 1.72 5.0 12.3 (12/21)     4/22 1.94<<1.56 4.2 16  29       Records and Results Reviewed   Past Medical History:  Diagnosis Date  . ABDOMINAL AORTIC ANEURYSM   . CAD    Non obstructive (mild) cath 2007  . CHF    EF 15% in 2007, 25% currently  . GLAUCOMA   . HYPERTENSION   . TOBACCO ABUSE    Previous    Past Surgical History:  Procedure Laterality Date  . BIV ICD INSERTION CRT-D N/A 12/06/2020   Procedure: BIV ICD INSERTION CRT-D;  Surgeon: Vickie Epley, MD;  Location: [place] [facility];  Service: Cardiovascular;  Laterality: N/A;  . Clavical Repair    . CORNEAL TRANSPLANT     Right eye x 2  . EYE SURGERY     Glaucoma  . POCKET REVISION/RELOCATION N/A 12/27/2020   Procedure: POCKET REVISION/RELOCATION;  Surgeon: Vickie Epley, MD;  Location: [place] [facility];  Service: Cardiovascular;  Laterality: N/A;  . RIGHT HEART CATH N/A 12/05/2020   Procedure: RIGHT HEART CATH;  Surgeon: Jolaine Artist, MD;  Location: [place] [facility];  Service: Cardiovascular;  Laterality: N/A;  . RIGHT/LEFT HEART CATH AND CORONARY ANGIOGRAPHY N/A 08/08/2020   Procedure: RIGHT/LEFT HEART CATH AND CORONARY ANGIOGRAPHY;  Surgeon: Jolaine Artist, MD;  Location: [place] [facility];  Service: Cardiovascular;  Laterality: N/A;    Current Meds  Medication Sig  . amiodarone (PACERONE) 200 MG tablet Take 200 mg by mouth daily.  Marland Kitchen aspirin 81 MG EC tablet Take 1 tablet (81 mg total) by mouth daily.  Marland Kitchen atorvastatin (LIPITOR)  40 MG tablet Take 40 mg by mouth daily.  . cholecalciferol (VITAMIN D) 1000 UNITS tablet Take 1,000 Units by mouth 2 (two) times daily.   . empagliflozin (JARDIANCE) 25 MG TABS tablet Take 25 mg by mouth daily.  . methotrexate (RHEUMATREX) 2.5 MG tablet Take 4 tablets (10 mg total) by mouth once a week.  . metoprolol succinate (TOPROL-XL) 50 MG 24 hr tablet Take 50 mg by mouth daily.  . predniSONE (DELTASONE) 10 MG tablet Take 10 mg by mouth in the morning, at noon, and at bedtime.  Marland Kitchen  spironolactone (ALDACTONE) 25 MG tablet Take 1 tablet (25 mg total) by mouth daily.  Marland Kitchen sulfamethoxazole-trimethoprim (BACTRIM DS) 800-160 MG tablet Take 1 tablet by mouth 3 (three) times a week.    No Known Allergies    Review of Systems negative except from HPI and PMH  Physical Exam BP 106/60   Pulse 73   Ht 5\' 10"  (1.778 m)   Wt 145 lb 3.2 oz (65.9 kg)   SpO2 99%   BMI 20.83 kg/m  Well developed and well nourished in no acute distress HENT normal Neck supple with JVP-flat Clear Device pocket with incisional separation in the middle ; without hematoma or erythema.  There is no tethering superficial ecchymosis persists Regular rate and rhythm, no  gallop No murmur Abd-soft with active BS No Clubbing cyanosis  edema Skin-warm and dry A & Oriented  Grossly normal sensory and motor function  ECG AVpacing @ 73 23/16/45 ECG atrial fibrillation 136 recyclable-/14/38 Left axis deviation -84  CrCl cannot be calculated (Patient's most recent lab result is older than the maximum 21 days allowed.).   Assessment and  Plan Cardiac sarcoid  Trifascicular block  Atrial fibrillation RVR  Nonischemic cardiomyopathy  Congestive heart failure chronic/systolic  High Risk Medication Surveillance    Amiodarone initiated for atrial fibrillation.  Failed to tolerate dofetilide.  Currently not on anticoagulation.  At high risk.  We will be seeing him next week to review his wound (see below) we will decide at that time about reinitiation  On prednisone and methotrexate.  Have advised him to take his methotrexate all on 1 day, i.e. 10 mg on 1 day during the week  Heart failure status is stable he is euvolemic.  We will continue him on Aldactone.  Hopefully we can stop his aspirin.  Mid portion lack of wound closure   bandaid applied and will have him return next week and SK and CL can look at together   Time 34 min     Current medicines are reviewed at length with the  patient today .  The patient does not  have concerns regarding medicines.

## 2021-01-08 NOTE — Patient Instructions (Addendum)
Medication Instructions:  Your physician recommends that you continue on your current medications as directed. Please refer to the Current Medication list given to you today.  *If you need a refill on your cardiac medications before your next appointment, please call your pharmacy*   Lab Work: CMET and TSH  If you have labs (blood work) drawn today and your tests are completely normal, you will receive your results only by: Marland Kitchen MyChart Message (if you have MyChart) OR . A paper copy in the mail If you have any lab test that is abnormal or we need to change your treatment, we will call you to review the results.   Testing/Procedures: None ordered.    Follow-Up: At [facility], you and your health needs are our priority.  As part of our continuing mission to provide you with exceptional heart care, we have created designated Provider Care Teams.  These Care Teams include your primary Cardiologist (physician) and Advanced Practice Providers (APPs -  Physician Assistants and Nurse Practitioners) who all work together to provide you with the care you need, when you need it.  We recommend signing up for the patient portal called "MyChart".  Sign up information is provided on this After Visit Summary.  MyChart is used to connect with patients for Virtual Visits (Telemedicine).  Patients are able to view lab/test results, encounter notes, upcoming appointments, etc.  Non-urgent messages can be sent to your provider as well.   To learn more about what you can do with MyChart, go to [email or web address].    Your next appointment:   As scheduled

## 2021-01-09 LAB — COMPREHENSIVE METABOLIC PANEL
ALT: 34 IU/L (ref 0–44)
AST: 22 IU/L (ref 0–40)
Albumin/Globulin Ratio: 1.5 (ref 1.2–2.2)
Albumin: 3.7 g/dL (ref 3.7–4.7)
Alkaline Phosphatase: 88 IU/L (ref 44–121)
BUN/Creatinine Ratio: 29 — ABNORMAL HIGH (ref 10–24)
BUN: 48 mg/dL — ABNORMAL HIGH (ref 8–27)
Bilirubin Total: 0.4 mg/dL (ref 0.0–1.2)
CO2: 17 mmol/L — ABNORMAL LOW (ref 20–29)
Calcium: 9.9 mg/dL (ref 8.6–10.2)
Chloride: 105 mmol/L (ref 96–106)
Creatinine, Ser: 1.64 mg/dL — ABNORMAL HIGH (ref 0.76–1.27)
Globulin, Total: 2.5 g/dL (ref 1.5–4.5)
Glucose: 117 mg/dL — ABNORMAL HIGH (ref 65–99)
Potassium: 5.1 mmol/L (ref 3.5–5.2)
Sodium: 134 mmol/L (ref 134–144)
Total Protein: 6.2 g/dL (ref 6.0–8.5)
eGFR: 44 mL/min/{1.73_m2} — ABNORMAL LOW (ref >59)

## 2021-01-09 LAB — TSH: TSH: 0.639 u[IU]/mL (ref 0.450–4.500)

## 2021-01-14 ENCOUNTER — Other Ambulatory Visit: Payer: Self-pay

## 2021-01-14 ENCOUNTER — Ambulatory Visit (INDEPENDENT_AMBULATORY_CARE_PROVIDER_SITE_OTHER): Payer: Medicare Other | Admitting: Emergency Medicine

## 2021-01-14 DIAGNOSIS — I429 Cardiomyopathy, unspecified: Secondary | ICD-10-CM

## 2021-01-14 NOTE — Progress Notes (Signed)
In-clinic wound check to follow up on ICD pocket revision 12/27/20 for hematoma/infection. Wound healing well. No drainage, increased redness, or warmth noted. Echymosis improving. Evaluated by Dr. [name] with no new orders. Patient instructed that he may resume showering. Patient is instructed to continue to monitor incision and notify device clinic at [phone] with any concerns. 91 day follow up with Dr. [name] 03/12/21.

## 2021-01-17 ENCOUNTER — Encounter (HOSPITAL_COMMUNITY): Payer: Self-pay

## 2021-01-17 ENCOUNTER — Other Ambulatory Visit: Payer: Self-pay

## 2021-01-17 ENCOUNTER — Ambulatory Visit (HOSPITAL_COMMUNITY)
Admission: EM | Admit: 2021-01-17 | Discharge: 2021-01-17 | Disposition: A | Discharge status: Home / Self Care | Payer: Medicare Other | Attending: Emergency Medicine | Admitting: Emergency Medicine

## 2021-01-17 DIAGNOSIS — B370 Candidal stomatitis: Secondary | ICD-10-CM | POA: Diagnosis not present

## 2021-01-17 MED ORDER — LIDOCAINE VISCOUS HCL 2 % MT SOLN: 10.0000 mL | OROMUCOSAL | 0 refills | Status: AC | PRN

## 2021-01-17 MED ORDER — NYSTATIN 100000 UNIT/ML MT SUSP: 2.0000 mL | Freq: Four times a day (QID) | OROMUCOSAL | 0 refills | Status: AC

## 2021-01-17 MED ORDER — FLUCONAZOLE 150 MG PO TABS: 150.0000 mg | ORAL_TABLET | ORAL | 0 refills | Status: AC

## 2021-01-17 NOTE — ED Provider Notes (Signed)
[name]  ____________________________________________  Time seen: Approximately 12:05 PM  I have reviewed the triage vital signs and the nursing notes.   HISTORY  Chief Complaint Sore Throat    HPI [name] is a 75 y.o. male who presents the emergency department complaining of mouth and throat pain.  Patient states that he has had symptoms for 7 to 10 days.   Patient states that prior to the onset of symptoms he was on an antibiotic.  Patient has been using Listerine mouthwash, lozenges trying to control symptoms.  No other complaints at this time.  No fevers or chills, nasal congestion, cough.  No difficulty breathing or swallowing.        Past Medical History:  Diagnosis Date  . ABDOMINAL AORTIC ANEURYSM   . CAD    Non obstructive (mild) cath 2007  . CHF    EF 15% in 2007, 25% currently  . GLAUCOMA   . HYPERTENSION   . TOBACCO ABUSE    Previous    Patient Active Problem List   Diagnosis Date Noted  . Advanced directives, counseling/discussion 01/02/2021  . Prediabetes 01/02/2021  . Sarcoidosis 01/02/2021  . Pulmonary nodule 01/02/2021  . Corneal abnormality 01/02/2021  . Abnormal positron emission tomography (PET) scan 01/02/2021  . Bullous keratopathy 11/28/2020  . Type 2 diabetes mellitus without complications ([name]) 38/45/3646  . Diabetes mellitus without complication ([name]) 80/32/1224  . Primary open-angle glaucoma, right eye, severe stage 11/28/2020  . Myopia, bilateral 11/28/2020  . Presbyopia 11/28/2020  . Unspecified atrial fibrillation ([name]) 11/28/2020  . Encounter for preprocedural laboratory examination 11/28/2020  . Encounter for immunization 11/28/2020  . Dietary counseling and surveillance 11/28/2020  . Other specified counseling 11/28/2020  . Other specified health status 11/28/2020  . Erectile dysfunction 11/28/2020  . Encounter for therapeutic drug level monitoring 11/28/2020  . Hypertrophy of prostate without urinary  obstruction and other lower urinary tract symptoms (LUTS) 11/28/2020  . 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 Closed fracture of ankle 11/28/2020  . Corneal ulcer 11/28/2020  . Cystoid macular edema 11/28/2020  . Depression 11/28/2020  . Lens replaced by other means 11/28/2020  . Other and unspecified hyperlipidemia 11/28/2020  . Pain in joint 11/28/2020  . Purulent endophthalmitis 11/28/2020  . Syncope and collapse 11/28/2020  . Vitamin D deficiency 11/28/2020  . Other specified glaucoma 10/23/2020  . Other age-related cataract 10/23/2020  . Former smoker 10/22/2020  . Atypical chest pain 08/07/2020  . Elevated troponin 08/07/2020  . Acute on chronic systolic (congestive) heart failure ([name]) 08/06/2020  . Acute on chronic combined systolic and diastolic CHF (congestive heart failure) ([name]) 07/01/2020  . Acute on chronic respiratory failure with hypoxia ([name]) 07/01/2020  . Chronic kidney disease, stage 3a ([name]) 07/01/2020  . Thrombocytopenia ([name]) 07/01/2020  . Positive D dimer 07/01/2020  . Acute hypoxemic respiratory failure ([name])   . PVD (peripheral vascular disease) ([name]) 08/29/2019  . Educated about COVID-19 virus infection 08/29/2019  . Bradycardia 09/05/2018  . Dyslipidemia 09/05/2018  . AAA (abdominal aortic aneurysm) without rupture ([name]) 09/05/2018  . Heart block 03/10/2018  . Cardiomyopathy ([name]) 10/29/2017  . TOBACCO ABUSE 11/30/2008  . CHRONIC SYSTOLIC HEART FAILURE 82/50/0370  . Peripheral arterial occlusive disease ([place]) 11/30/2008  . GLAUCOMA 11/29/2008  . Essential (primary) hypertension 11/29/2008  . Coronary atherosclerosis 11/29/2008  . Monomorphic ventricular tachycardia ([name]) 11/29/2008  . Unspecified systolic (congestive) heart failure ([name]) 11/29/2008    Past Surgical History:  Procedure Laterality Date  . BIV ICD  INSERTION CRT-D N/A 12/06/2020   Procedure: BIV ICD INSERTION CRT-D;  Surgeon: Vickie Epley, MD;  Location: [facility];   Service: Cardiovascular;  Laterality: N/A;  . Clavical Repair    . CORNEAL TRANSPLANT     Right eye x 2  . EYE SURGERY     Glaucoma  . POCKET REVISION/RELOCATION N/A 12/27/2020   Procedure: POCKET REVISION/RELOCATION;  Surgeon: Vickie Epley, MD;  Location: [facility];  Service: Cardiovascular;  Laterality: N/A;  . RIGHT HEART CATH N/A 12/05/2020   Procedure: RIGHT HEART CATH;  Surgeon: Jolaine Artist, MD;  Location: [facility];  Service: Cardiovascular;  Laterality: N/A;  . RIGHT/LEFT HEART CATH AND CORONARY ANGIOGRAPHY N/A 08/08/2020   Procedure: RIGHT/LEFT HEART CATH AND CORONARY ANGIOGRAPHY;  Surgeon: Jolaine Artist, MD;  Location: [facility];  Service: Cardiovascular;  Laterality: N/A;    Prior to Admission medications   Medication Sig Start Date End Date Taking? Authorizing Provider  fluconazole (DIFLUCAN) 150 MG tablet Take 1 tablet (150 mg total) by mouth once a week for 4 doses. 01/17/21 02/08/21 Yes Philopater Mucha, Charline Bills, PA-C  lidocaine (XYLOCAINE) 2 % solution Use as directed 10 mLs in the mouth or throat every 4 (four) hours as needed for mouth pain. Swish, gargle, and spit 01/17/21  Yes Addilee Neu, Charline Bills, PA-C  nystatin (MYCOSTATIN) 100000 UNIT/ML suspension Take 2 mLs (200,000 Units total) by mouth 4 (four) times daily. 82ml to each cheek QID x 7 days 01/17/21  Yes Orlie Cundari, Charline Bills, PA-C  amiodarone (PACERONE) 200 MG tablet Take 200 mg by mouth daily.    [provider]  aspirin 81 MG EC tablet Take 1 tablet (81 mg total) by mouth daily. 01/02/21   Vickie Epley, MD  atorvastatin (LIPITOR) 40 MG tablet Take 40 mg by mouth daily. 10/23/20   [provider]  cholecalciferol (VITAMIN D) 1000 UNITS tablet Take 1,000 Units by mouth 2 (two) times daily.     [provider]  empagliflozin (JARDIANCE) 25 MG TABS tablet Take 25 mg by mouth daily. 12/12/20   [provider]  methotrexate (RHEUMATREX) 2.5 MG  tablet Take 4 tablets (10 mg total) by mouth once a week. 12/11/20   Deboraha Sprang, MD  metoprolol succinate (TOPROL-XL) 50 MG 24 hr tablet Take 50 mg by mouth daily. 12/12/20   [provider]  predniSONE (DELTASONE) 10 MG tablet Take 10 mg by mouth in the morning, at noon, and at bedtime.    [provider]  spironolactone (ALDACTONE) 25 MG tablet Take 1 tablet (25 mg total) by mouth daily. 10/29/20   Clegg, Amy D, NP  sulfamethoxazole-trimethoprim (BACTRIM DS) 800-160 MG tablet Take 1 tablet by mouth 3 (three) times a week. 01/08/21   Vickie Epley, MD    Allergies Patient has no known allergies.  Family History  Problem Relation Age of Onset  . Diabetes Mother   . Diabetes Sister   . Diabetes Brother     Social History Social History   Tobacco Use  . Smoking status: Former Smoker    Quit date: 03/08/2008    Years since quitting: 12.8  . Smokeless tobacco: Never Used  Vaping Use  . Vaping Use: Never used  Substance Use Topics  . Alcohol use: No  . Drug use: No     Review of Systems  Constitutional: No fever/chills Eyes: No visual changes. No discharge ENT: Pain in the mouth and  sore throat Cardiovascular: no chest pain. Respiratory: no cough. No SOB. Gastrointestinal: No abdominal pain.  No nausea, no vomiting.  No diarrhea.  No constipation. Musculoskeletal: Negative for musculoskeletal pain. Skin: Negative for rash, abrasions, lacerations, ecchymosis. Neurological: Negative for headaches, focal weakness or numbness.  10 System ROS otherwise negative.  ____________________________________________   PHYSICAL EXAM:  VITAL SIGNS: ED Triage Vitals  Enc Vitals Group     BP 01/17/21 1132 112/70     Pulse Rate 01/17/21 1132 67     Resp 01/17/21 1132 17     Temp 01/17/21 1132 98 F (36.7 C)     Temp Source 01/17/21 1132 Oral     SpO2 01/17/21 1132 100 %     Weight --      Height --      Head Circumference --      Peak Flow --      Pain  Score 01/17/21 1130 7     Pain Loc --      Pain Edu? --      Excl. in [place]? --      Constitutional: Alert and oriented. Well appearing and in no acute distress. Eyes: Conjunctivae are normal. PERRL. EOMI. Head: Atraumatic. ENT:      Ears:       Nose: No congestion/rhinnorhea.      Mouth/Throat: Mucous membranes are moist.  Visualization of the tongue, roof of the mouth, bilateral cheeks and throat reveals white plaque consistent with oral candidiasis.  There is no gross erythema of the tonsils or oropharynx.  There is some erythema to the roof of the mouth and the tongue underlying this plaque.  Uvula is midline. Neck: No stridor.    Cardiovascular: Normal rate, regular rhythm. Normal S1 and S2.  Good peripheral circulation. Respiratory: Normal respiratory effort without tachypnea or retractions. Lungs CTAB. Good air entry to the bases with no decreased or absent breath sounds. Musculoskeletal: Full range of motion to all extremities. No gross deformities appreciated. Neurologic:  Normal speech and language. No gross focal neurologic deficits are appreciated.  Skin:  Skin is warm, dry and intact. No rash noted. Psychiatric: Mood and affect are normal. Speech and behavior are normal. Patient exhibits appropriate insight and judgement.   ____________________________________________   LABS (all labs ordered are listed, but only abnormal results are displayed)  Labs Reviewed - No data to display ____________________________________________  EKG   ____________________________________________  RADIOLOGY   No results found.  ____________________________________________    PROCEDURES  Procedure(s) performed:    Procedures    Medications - No data to display   ____________________________________________   INITIAL IMPRESSION / ASSESSMENT AND PLAN / ED COURSE  Pertinent labs & imaging results that were available during my care of the patient were reviewed by me and  considered in my medical decision making (see chart for details).  Review of the [place] CSRS was performed in accordance of the [place] prior to dispensing any controlled drugs.           Patient's diagnosis is consistent with oral candidiasis.  Patient presented to the urgent care complaining of pain in the mouth and throat.  Patient has findings consistent with thrush which would be secondary to the patient's recent antibiotic use.  Patient has dentures and it does not appear that the dentures likely have been fully cleansed causing an aggravation of patient's symptoms.  There was no evidence of airway compromise, or findings concerning for peritonsillar abscess.  No evidence of strep.  Patient has no fevers or chills.  At this time no imaging or labs are deemed necessary.  Patient will be started on nystatin, oral Diflucan and viscous lidocaine for symptom relief..  Patient is given ED precautions to return to the ED for any worsening or new symptoms.     ____________________________________________  FINAL CLINICAL IMPRESSION(S) / DIAGNOSES  Final diagnoses:  Oral candidiasis      NEW MEDICATIONS STARTED DURING THIS VISIT:  ED Discharge Orders         Ordered    nystatin (MYCOSTATIN) 100000 UNIT/ML suspension  4 times daily        01/17/21 1215    fluconazole (DIFLUCAN) 150 MG tablet  Weekly        01/17/21 1215    lidocaine (XYLOCAINE) 2 % solution  Every 4 hours PRN        01/17/21 1215              This chart was dictated using voice recognition software/Dragon. Despite best efforts to proofread, errors can occur which can change the meaning. Any change was purely unintentional.    Darletta Moll, PA-C 01/17/21 1216

## 2021-01-17 NOTE — ED Triage Notes (Signed)
Pt in with c/o ST x 9 days  Pt states the inside of his mouth feels raw  Pt has using throat lozenges since last night

## 2021-01-22 ENCOUNTER — Other Ambulatory Visit: Payer: Self-pay

## 2021-01-22 ENCOUNTER — Other Ambulatory Visit: Payer: Self-pay | Admitting: *Deleted

## 2021-01-22 NOTE — Patient Outreach (Signed)
[facility]) Care Management  01/22/2021  [name] 03/19/1946 [id number]   THNoutreachto complex care patient   Insurance:NextGen MedicareVA benefit [facility] admissions x2ED visits x 2in Robert last 6 months Last admission 12/24/20 ED visit as [name], [name] Dr [name] BP 131/78 concern for pocket infection at defibrillator site 12/03/20 -12/07/20 atrial fibrillation (for a week) -had not taken Eliquis -Was direct admitted forTikosyn, butTikosynD/C'ed due to prolonged QT 08/06/20 to 12/18/21Hypertension (HTN), monomorphic ventricular tachycardia,Chronic Kidney disease (CKD)stage 3 a, acute on chronic heart failure atypical chest pain, acute respiratory failure with hypoxia secondary to heart failure   Patient is able to verify HIPAA ([name]) identifiers Reviewed and addressed Robert purpose of Robert follow up call with Robert patient  Consent: [facility]) RN CM reviewed [name] services with patient. Patient gave verbal consent for services.   Follow up assessment  Diabetes Cbg values 108-109 Wt 147-151 lbs Smoking- stop smoking Dec 27 2020 Had hx of 35 years of smoking  Chronic Kidney disease (CKD) Off lasix on spironolactone  01/16/21 Urgent care for oral thrush, has improved by 01/22/21  Interested in Atrial fibrillation and Chronic Kidney disease (CKD) home care     [name] progression  Discussed his progression from EMMI to complex and to [name] disease management [name] agrees to transferring to [name] disease management services    Plans Robert [facility] RN CM will follow up with [name] within Robert next 7-14 business days for information on and to refer to [name] health coach Pt encouraged to return a call to [name] RN CM prn  Robert Millin L. Lavina Hamman, RN, BSN, Section Coordinator Office number [phone] Main [facility] number [phone] Fax number [phone]

## 2021-01-27 ENCOUNTER — Telehealth: Payer: Self-pay | Admitting: Internal Medicine

## 2021-01-27 ENCOUNTER — Other Ambulatory Visit: Payer: Self-pay | Admitting: *Deleted

## 2021-01-27 ENCOUNTER — Other Ambulatory Visit: Payer: Self-pay

## 2021-01-27 ENCOUNTER — Encounter: Payer: Self-pay | Admitting: *Deleted

## 2021-01-27 ENCOUNTER — Other Ambulatory Visit (HOSPITAL_COMMUNITY): Payer: Self-pay

## 2021-01-27 NOTE — Patient Outreach (Signed)
[facility]) Care Management  01/27/2021  [name] 1946/07/20 [id number]  THNoutreachto complex care patient   Insurance:NextGen MedicareVA benefit [facility] admissions x2ED visits x 2in the last 6 months Last admission 12/24/20 ED visit as [place] [facility], [facility] [place] [address] Jeri Cos BP 131/78 concern for pocket infection at defibrillator site 12/03/20 -12/07/20 atrial fibrillation (for a week) -had not taken Eliquis -Was direct admitted forTikosyn, butTikosynD/C'ed due to prolonged QT 08/06/20 to 12/18/21Hypertension (HTN), monomorphic ventricular tachycardia,Chronic Kidney disease (CKD)stage 3 a, acute on chronic heart failure atypical chest pain, acute respiratory failure with hypoxia secondary to heart failure   Patient is able to verify HIPAA ([place]) identifiers Reviewed and addressed the purpose of the follow up call with the patient  Consent: [facility]) RN CM reviewed [facility] services with patient. Patient gave verbal consent for services.  Follow up assessment and Education completed  Detail education provided for [name] initial diagnosis of paroxysmal atrial fibrillation (Afib)  Discussed causes of, pathophysiology of, co morbidities of, treatment for and home care for atrial fibrillation & congestive Heart Failure (CHF). Discussed Afib/CHF action plans for worsening symptoms  Today [name] discussed having edema below his ankles and of his feet for 6 days Denies dizziness, shortness of breath (sob) or chest pain Reports his implantable cardioverter defibrillator (ICD) site is healed and without signs of infection   Discussed why he had been on prednisone  Denies salt or salty food intake. Denies long periods of standing  Pitting edema discussed and he confirms he has noted pitting edema as evidence by imprints left when his socks are removed Reports weight only changing 1-2 lbs He  documents qd  Today's weight was 151.4 01/26/21 weight was 151.3  He states he will call Dr [name] cardiology office as [facility] RN CM encouraged as part of his CHF/Afib action plan He and [facility] RN CM discussed use of Compression hose- the purpose to assist with edema control  Chronic kidney disease,  Discussed his stage 3 a hospital diagnosis + how CHF (co morbidity) relates to CKD Discussed his use of spironolactone  Discussed the importance of a nephrologist and encouraged outreach to pcp/cardiologist for evaluation and possible need for nephrology follow up  Sent EMMI (s) via mail on Afib and CHF, defibrillators and CKD  Progression of [facility] services  [name] without care coordination needs but with need of further disease management agrees to transfer of services to [facility] disease manager  Also with the option of outreaching to [facility] RN CM if any further questions  Plan Patient agrees to care plan transfer to health coach agreed  Pt encouraged to return a call to [facility] RN CM prn  Goals Addressed              This Visit's Progress     Patient Stated   .  [facility]) Monitor and Manage My Blood Sugar-Diabetes Type 2 (pt-stated)   On track     Timeframe:  Short-Term Goal Priority:  Medium Start Date:                      11/28/20       Expected End Date:     02/20/21                  Follow up date pending [facility] disease management outreach   - check blood sugar at prescribed times - check blood sugar if I feel  it is too high or too low - enter blood sugar readings and medication or insulin into daily log   Barriers: Knowledge    Why is this important?    Checking your blood sugar at home helps to keep it from getting very high or very low.   Writing the results in a diary or log helps the doctor know how to care for you.   Your blood sugar log should have the time, date and the results.   Also, write down the amount of insulin or other medicine that you take.   Other information, like  what you ate, exercise done and how you were feeling, will also be helpful.     Notes:  01/27/21 continues to report his blood sugars are in the 100s avg 109 01/03/21  reported to be managed at home 100's 12/26/20 cbg value 108 voiced understood about DM sick expectations and home care  Has all Dm medication issues resolved via [place] 11/28/20 checking cbg every Thursday per pt. Last check on 11/21/20 =86, not checked today 11/28/20    .  [name]) Track and Manage Heart Rate and Rhythm-Atrial Fibrillation (pt-stated)   On track     Timeframe:  Short-Term Goal Priority:  Medium Start Date:           01/27/21                  Expected End Date:    03/22/21                   Follow up date pending [facility] disease management outreach   - check pulse (heart) rate once a day - take medicine as prescribed    Why is this important?    Atrial fibrillation may have no symptoms. Sometimes the symptoms get worse or happen more often.   It is important to keep track of what your symptoms are and when they happen.   A change in symptoms is important to discuss with your doctor or nurse.   Being active and healthy eating will also help you manage your heart condition.     Notes:  01/27/21 Received education on average heart rate/defib, voiced understanding of elevated heart rates during hospitalization, denies chest pain/discomfort aware of worsening symptoms to report to MD     .  [facility]) Track and Manage Symptoms-Heart Failure (pt-stated)   On track     Timeframe:  Short-Term Goal Priority:  Medium Start Date:         08/30/20                    Expected End Date:     02/20/21                  Follow up date pending [facility] disease management outreach  - develop a rescue plan - know when to call the doctor - track symptoms and what helps feel better or worse   Barriers: Knowledge   Why is this important?    You will be able to handle your symptoms better if you keep track of them.   Making some simple changes  to your lifestyle will help.   Eating healthy is one thing you can do to take good care of yourself.    Notes:  01/27/21 with CHF/CKD s/s of edema of ankle and feet for 6 days wt per pt remains unchanged, taking Spironolactone as ordered He weighs and documents daily. Denies shortness  of breath, chest pain or dizziness Pt to call cardiology for worsening symptoms. Agrees to [facility] disease management services Pt to speak with pcp or cardiology about CKD follow up 01/03/21 daily weights around 147 lbs  12/26/20 12/25/20 confirmed infection of defib site. Saw MD 12/26/20 planned pending procedure 12/27/20 11/28/20  No worsening CHF symptoms        Yanelis Osika L. Lavina Hamman, RN, BSN, [place] Coordinator Office number [phone] Main [facility] number [phone] Fax number [phone]'

## 2021-01-27 NOTE — Telephone Encounter (Signed)
Pt c/o swelling: STAT is pt has developed SOB within 24 hours  1. If swelling, where is the swelling located? Feet, below ankles  2. How much weight have you gained and in what time span? Does not think he has gained weight, 1.5 and 2 lbs maybe   3. Have you gained 3 pounds in a day or 5 pounds in a week? no  4. Do you have a log of your daily weights (if so, list)? 149-152  5. Are you currently taking a fluid pill? yes  6. Are you currently SOB? no  7. Have you traveled recently? No    Patient states he is having swelling in his feet and does not think he has gained weight. He states he has had the swelling for 5-6 days. He states his weight ranges around 149-152 and he is not having any other symptoms.

## 2021-01-27 NOTE — Telephone Encounter (Signed)
I reviewed his chart.  I would agree with the conservative measures undertaken.  He has CKD and is not on Lasix but I would be happy to give him a short course of this if his edema does not resolve.  I am not sure I am taking care of him any longer as he is followed closely in the [facility] but I would be happy to answer questions as needed if I am still in the loop.

## 2021-01-27 NOTE — Telephone Encounter (Signed)
Returned the call to the patient. He stated that for the last 6 days that he has been having lower extremity edema that is mainly in his feet and ankles. He denies shortness of breath.  He stated that his weight has been stable around 150-151. He has decreased his sodium intake lately and elevates his legs when he can.   He stated that the swelling does not get better overnight and stays about the same.  He currently takes spironolactone 25 mg once daily.   He has been advised to keep elevating legs, keep a daily weight log and to look into wearing compression stockings.

## 2021-01-28 ENCOUNTER — Other Ambulatory Visit (HOSPITAL_COMMUNITY): Payer: Self-pay

## 2021-01-29 ENCOUNTER — Other Ambulatory Visit: Payer: Self-pay | Admitting: *Deleted

## 2021-01-29 NOTE — Telephone Encounter (Signed)
Spoke with pt, aware of dr hochrein's recommendations. He has already talked with the heart failure clinic.

## 2021-01-31 ENCOUNTER — Other Ambulatory Visit: Payer: Self-pay | Admitting: *Deleted

## 2021-02-06 ENCOUNTER — Encounter: Payer: Self-pay | Admitting: Medical

## 2021-02-06 ENCOUNTER — Telehealth: Payer: Self-pay

## 2021-02-06 ENCOUNTER — Ambulatory Visit (INDEPENDENT_AMBULATORY_CARE_PROVIDER_SITE_OTHER): Payer: Medicare Other | Admitting: Medical

## 2021-02-06 ENCOUNTER — Other Ambulatory Visit: Payer: Self-pay

## 2021-02-06 VITALS — BP 118/72 | HR 73 | Temp 98.3°F | Ht 70.0 in | Wt 159.6 lb

## 2021-02-06 DIAGNOSIS — H401113 Primary open-angle glaucoma, right eye, severe stage: Secondary | ICD-10-CM

## 2021-02-06 DIAGNOSIS — Z79899 Other long term (current) drug therapy: Secondary | ICD-10-CM

## 2021-02-06 DIAGNOSIS — I739 Peripheral vascular disease, unspecified: Secondary | ICD-10-CM

## 2021-02-06 DIAGNOSIS — M25473 Effusion, unspecified ankle: Secondary | ICD-10-CM

## 2021-02-06 DIAGNOSIS — B370 Candidal stomatitis: Secondary | ICD-10-CM | POA: Diagnosis not present

## 2021-02-06 DIAGNOSIS — I10 Essential (primary) hypertension: Secondary | ICD-10-CM

## 2021-02-06 DIAGNOSIS — Z720 Tobacco use: Secondary | ICD-10-CM

## 2021-02-06 DIAGNOSIS — N1831 Chronic kidney disease, stage 3a: Secondary | ICD-10-CM

## 2021-02-06 DIAGNOSIS — I2510 Atherosclerotic heart disease of native coronary artery without angina pectoris: Secondary | ICD-10-CM

## 2021-02-06 DIAGNOSIS — I5022 Chronic systolic (congestive) heart failure: Secondary | ICD-10-CM | POA: Diagnosis not present

## 2021-02-06 DIAGNOSIS — I779 Disorder of arteries and arterioles, unspecified: Secondary | ICD-10-CM

## 2021-02-06 DIAGNOSIS — I714 Abdominal aortic aneurysm, without rupture, unspecified: Secondary | ICD-10-CM

## 2021-02-06 DIAGNOSIS — R7301 Impaired fasting glucose: Secondary | ICD-10-CM

## 2021-02-06 DIAGNOSIS — Z1159 Encounter for screening for other viral diseases: Secondary | ICD-10-CM

## 2021-02-06 DIAGNOSIS — I429 Cardiomyopathy, unspecified: Secondary | ICD-10-CM

## 2021-02-06 DIAGNOSIS — E785 Hyperlipidemia, unspecified: Secondary | ICD-10-CM

## 2021-02-06 DIAGNOSIS — D869 Sarcoidosis, unspecified: Secondary | ICD-10-CM

## 2021-02-06 LAB — GLUCOSE, POCT (MANUAL RESULT ENTRY): POC Glucose: 85 mg/dl (ref 70–99)

## 2021-02-06 LAB — POCT ORAL KOH: KOH Prep POC: POSITIVE — AB

## 2021-02-06 MED ORDER — FLUCONAZOLE 100 MG PO TABS: 100.0000 mg | ORAL_TABLET | Freq: Every day | ORAL | 0 refills | Status: DC

## 2021-02-06 NOTE — Telephone Encounter (Signed)
Spoke with pt and advised pt's TSH and CMET were normal per Dr [name].  Pt reports continued swelling in his feet and ankles.  Pt states he saw his PCP today d/y problem with thrush in his mouth.  Reiterated importance of daily weights, taking medication as prescribed, daily walking, elevating feet and legs when sitting, low Na+ diet.  Pt denies current CP or SOB. Pt advised to contact CHF clinic to discuss increased feet and ankle swelling.  Pt reminded he is due to see Dr [name] 03/12/2021.  Pt verbalizes understanding and agrees with current plan.

## 2021-02-06 NOTE — Telephone Encounter (Signed)
Patient returned call stating he received voicemail from RN and he is aware his results are normal, but he would still like to further discuss. He stated he also wanted to make the nurse aware he has still been having swelling and weight gain, but the call was disconnected before I was able to obtain additional information. Unable to contact patient.  Please return his call when able.

## 2021-02-06 NOTE — Progress Notes (Signed)
Subjective:  [name] is a 75 y.o. male who presents for Chief Complaint  Patient presents with   Follow-up    Ritta Slot still has not healed. Wants to use [facility] for any meds sent today       Here for f/u from emergency dept.   This is my second visit with him.  He established care here in 01/02/21.    He was typically being seen at the [facility] hospital in [place], but wants local primary care as well.    He has had recent hospitalizations in December, April and May regarding his heart.   Medical team: Dr. Virl Axe, Dr. Glori Bickers, Dr. Minus Breeding, Doristine Devoid, NP [facility] for chronic disease, medications, diabetes No current nephrologist, no endocrinologist, no pulmonologist, no rheumatologist   Was seen in urgent care 12/2020 for thrust.  (I reviewed urgent care 01/17/21 notes)  was prescribed Diflucan tablet, Nystatin suspension oral and Xylocain solution for pain.   Symptoms persist, tongue and mouth sore patches, throat discomfort.  Prior to now, no history of thrush.     Divorced.  Last sexual activity 4-5 years.  No hx/o HIV.     Just had recent antibiotics with recent ICD implant through cardiology.  Thinks that put him at risk for the thrush.   He complains of ongoing ankle swelling, but not lower leg swelling.    He complains of hx/o abnormal kidney function, wonders if time to see kidney doctor.   He is good about medication compliance.    Smoker, has cut way down, only occasional tobacco now.  Was recently hospitalized in April and May  2022 for afib, and had defibrillator installed.  Had infection, had to go back and have repeated surgery due to injection.  Was on blood thinner but this was stopped with the recent bleeding and complication from surgery for defibrillator and subsequent infection.   He notes diabetes is a new diagnosis.   The notes originally being put on a medicaiton in March, but this over worked his kidneys so it was discontinued.    Denies polyuria ,no polydipsia.   No blurred vision.    No other aggravating or relieving factors.    No other c/o.  Past Medical History:  Diagnosis Date   ABDOMINAL AORTIC ANEURYSM    CAD    Non obstructive (mild) cath 2007   CHF    EF 15% in 2007, 25% currently   GLAUCOMA    HYPERTENSION    TOBACCO ABUSE    Previous   Current Outpatient Medications on File Prior to Visit  Medication Sig Dispense Refill   amiodarone (PACERONE) 200 MG tablet Take 200 mg by mouth daily.     aspirin 81 MG EC tablet Take 1 tablet (81 mg total) by mouth daily. 30 tablet 11   atorvastatin (LIPITOR) 40 MG tablet Take 40 mg by mouth daily.     cholecalciferol (VITAMIN D) 1000 UNITS tablet Take 1,000 Units by mouth 2 (two) times daily.      empagliflozin (JARDIANCE) 25 MG TABS tablet Take 25 mg by mouth daily.     fluconazole (DIFLUCAN) 150 MG tablet Take 1 tablet (150 mg total) by mouth once a week for 4 doses. 4 tablet 0   lidocaine (XYLOCAINE) 2 % solution Use as directed 10 mLs in the mouth or throat every 4 (four) hours as needed for mouth pain. Swish, gargle, and spit 200 mL 0   methotrexate (RHEUMATREX) 2.5 MG tablet Take  4 tablets (10 mg total) by mouth once a week. 24 tablet 3   metoprolol succinate (TOPROL-XL) 50 MG 24 hr tablet Take 50 mg by mouth daily.     nystatin (MYCOSTATIN) 100000 UNIT/ML suspension Take 2 mLs (200,000 Units total) by mouth 4 (four) times daily. 68ml to each cheek QID x 7 days 100 mL 0   predniSONE (DELTASONE) 10 MG tablet Take 10 mg by mouth in the morning, at noon, and at bedtime.     spironolactone (ALDACTONE) 25 MG tablet Take 1 tablet (25 mg total) by mouth daily. 90 tablet 3   sulfamethoxazole-trimethoprim (BACTRIM DS) 800-160 MG tablet Take 1 tablet by mouth 3 (three) times a week. (Patient not taking: Reported on 02/06/2021) 12 tablet 3   No current facility-administered medications on file prior to visit.     The following portions of the patient's history were  reviewed and updated as appropriate: allergies, current medications, past family history, past medical history, past social history, past surgical history and problem list.  ROS Otherwise as in subjective above     Objective: BP 118/72   Pulse 73   Temp 98.3 F (36.8 C)   Ht 5\' 10"  (1.778 m)   Wt 159 lb 9.6 oz (72.4 kg)   SpO2 97%   BMI 22.90 kg/m   Wt Readings from Last 3 Encounters:  02/06/21 159 lb 9.6 oz (72.4 kg)  01/27/21 151 lb 4 oz (68.6 kg)  01/08/21 145 lb 3.2 oz (65.9 kg)    General appearance: alert, no distress, well developed, well nourished, lean African American male HEENT: normocephalic, sclerae anicteric, conjunctiva pink and moist, nares patent, no discharge or erythema, pharynx with patchy erythema and some areas appear raw or inflamed Oral cavity: tongue seems somewhat dry, slight brownish coloration across top of tongue, but mostly pink coloration with some inflamed patches.  No specific ulcers noted,  dentures present.  No white adherence discharge. Neck: supple, no lymphadenopathy, no thyromegaly, no masses Heart: RRR, normal S1, S2, no murmurs Lungs: CTA bilaterally, no wheezes, rhonchi, or rales Pulses: 1+ radial pulses, 1+ pedal pulses, normal cap refill Ext: +1 pitting ankle edema without lower leg edema     Assessment: Encounter Diagnoses  Name Primary?   Thrush, oral Yes   Ankle edema    Chronic systolic congestive heart failure (HCC)    PVD (peripheral vascular disease) (HCC)    Primary open-angle glaucoma, right eye, severe stage    Peripheral arterial occlusive disease ([place])    Essential (primary) hypertension    Tobacco use    Dyslipidemia    Atherosclerosis of native coronary artery of native heart, unspecified whether angina present    AAA (abdominal aortic aneurysm) without rupture (HCC)    Chronic kidney disease, stage 3a (HCC)    Cardiomyopathy, unspecified type ([place])    Impaired fasting blood sugar    Encounter for  hepatitis C screening test for low risk patient    High risk medication use    Sarcoid      Plan:  Discussed case with supervising physician Dr. Redmond School today given the complexity of his case and not resolving thrush.  Thrush -oral KOH positive for yeast, begin Diflucan daily for 1 week.  We discussed risk for thrush likely multifactorial due to being on medications that we can or alter immune response such as prednisone or methotrexate in addition to being on Jardiance.  Discussed need for good water intake.  He is prediabetic, blood sugar was  85 today.  Labs today for further evaluation of recent thrush  Follow-up with cardiology team given recent implanted defibrillator, heart disease, heart failure and suspected sarcoidosis of the heart.  He has biopsy of his heart at [facility] within the next month he thinks.  For now he has been continued on Prednisone and Methotrexate per cardiology.  Impaired glucose-counseled on low sugar diet, on Jardiance due to heart disease but also helps with blood sugar control  CKD-discussed the nature of CKD, likely due to underlying high blood pressure and heart disease, we will go ahead and refer to nephrology to have them on a routine, avoid NSAIDs, avoid dehydration  [place] was seen today for follow-up.  Diagnoses and all orders for this visit:  Thrush, oral -     HIV Antibody (routine testing w rflx) -     Hepatitis C antibody -     Hepatitis B surface antigen -     CBC -     Sedimentation rate -     POCT Oral KOH  Ankle edema  Chronic systolic congestive heart failure (HCC)  PVD (peripheral vascular disease) (HCC)  Primary open-angle glaucoma, right eye, severe stage  Peripheral arterial occlusive disease ([place])  Essential (primary) hypertension -     Ambulatory referral to Nephrology  Tobacco use  Dyslipidemia  Atherosclerosis of native coronary artery of native heart, unspecified whether angina present  AAA (abdominal aortic  aneurysm) without rupture (HCC)  Chronic kidney disease, stage 3a ([place]) -     Ambulatory referral to Nephrology  Cardiomyopathy, unspecified type ([place])  Impaired fasting blood sugar -     Glucose (CBG)  Encounter for hepatitis C screening test for low risk patient -     HIV Antibody (routine testing w rflx) -     Hepatitis C antibody -     Hepatitis B surface antigen  High risk medication use  Sarcoid -     CBC -     Sedimentation rate  Other orders -     fluconazole (DIFLUCAN) 100 MG tablet; Take 1 tablet (100 mg total) by mouth daily.   Follow up: pending labs

## 2021-02-06 NOTE — Telephone Encounter (Signed)
Attempted phone call to pt.  Per Epic ok to leave voicemail message.  Pt advised per Dr [name] labs are normal.  Pt may call [phone] for any further questions.

## 2021-02-06 NOTE — Telephone Encounter (Signed)
-----   Message from Deboraha Sprang, MD sent at 02/05/2021 11:26 AM EDT -----  Please inform patient that drug surveillance labs are normal

## 2021-02-07 ENCOUNTER — Telehealth (HOSPITAL_COMMUNITY): Payer: Self-pay | Admitting: *Deleted

## 2021-02-07 LAB — HEPATITIS C ANTIBODY: Hep C Virus Ab: <0.1 s/co ratio (ref 0.0–0.9)

## 2021-02-07 LAB — CBC
Hematocrit: 43.3 % (ref 37.5–51.0)
Hemoglobin: 14.8 g/dL (ref 13.0–17.7)
MCH: 35.2 pg — ABNORMAL HIGH (ref 26.6–33.0)
MCHC: 34.2 g/dL (ref 31.5–35.7)
MCV: 103 fL — ABNORMAL HIGH (ref 79–97)
NRBC: 1 % — ABNORMAL HIGH (ref 0–0)
Platelets: 75 10*3/uL — CL (ref 150–450)
RBC: 4.21 x10E6/uL (ref 4.14–5.80)
RDW: 16.3 % — ABNORMAL HIGH (ref 11.6–15.4)
WBC: 8.3 10*3/uL (ref 3.4–10.8)

## 2021-02-07 LAB — HIV ANTIBODY (ROUTINE TESTING W REFLEX): HIV Screen 4th Generation wRfx: NONREACTIVE

## 2021-02-07 LAB — HEPATITIS B SURFACE ANTIGEN: Hepatitis B Surface Ag: NEGATIVE

## 2021-02-07 LAB — SEDIMENTATION RATE: Sed Rate: 3 mm/hr (ref 0–30)

## 2021-02-07 NOTE — Telephone Encounter (Signed)
Can prescribe lasix 40mg  to take only as needed for swelling. Take with kcl 20. Let us know if not improving.

## 2021-02-07 NOTE — Telephone Encounter (Signed)
Pt called c/o swelling in feet and ankles x 2 weeks. Pt denies shortness of breath, chest pain, fatigue. Pt said he is taking all medications as prescribed. Pt denies increase in weight.   Routed to [facility] for advice

## 2021-02-10 ENCOUNTER — Other Ambulatory Visit: Payer: Self-pay | Admitting: Medical

## 2021-02-10 DIAGNOSIS — D869 Sarcoidosis, unspecified: Secondary | ICD-10-CM

## 2021-02-10 DIAGNOSIS — D696 Thrombocytopenia, unspecified: Secondary | ICD-10-CM

## 2021-02-10 MED ORDER — POTASSIUM CHLORIDE ER 20 MEQ PO TBCR: EXTENDED_RELEASE_TABLET | ORAL | 3 refills | Status: AC

## 2021-02-10 MED ORDER — FUROSEMIDE 40 MG PO TABS: 40.0000 mg | ORAL_TABLET | Freq: Every day | ORAL | 3 refills | Status: DC | PRN

## 2021-02-10 NOTE — Telephone Encounter (Signed)
Pt aware and agreeable with plan.  

## 2021-02-11 ENCOUNTER — Other Ambulatory Visit: Payer: Self-pay | Admitting: Medical

## 2021-02-12 LAB — B12 AND FOLATE PANEL
Folate: 12.2 ng/mL (ref >3.0)
Vitamin B-12: 592 pg/mL (ref 232–1245)

## 2021-02-12 LAB — SPECIMEN STATUS REPORT

## 2021-02-17 ENCOUNTER — Telehealth: Payer: Self-pay | Admitting: Hematology

## 2021-02-17 NOTE — Progress Notes (Addendum)
[place]   Telephone:([phone]) [phone] Fax:([phone]) [place] Note   Patient Care Team: Tysinger, Camelia Eng, PA-C as PCP - General (Family Medicine) Minus Breeding, MD as PCP - Cardiology (Cardiology) Deboraha Sprang, MD as PCP - Electrophysiology (Cardiology) Minus Breeding, MD as Consulting Physician (Cardiology) Minus Breeding, MD as Consulting Physician (Cardiology) Hiram Gash, MD as Referring Physician (Cardiology) Sherran Needs, NP as Nurse Practitioner (Nurse Practitioner) Pleasant, Eppie Gibson, RN as [place] Management  Date of Service:  02/18/2021   CHIEF COMPLAINTS/PURPOSE OF CONSULTATION:  Thrombocytopenia   REFERRING PHYSICIAN:  PCP Chana Bode PA  HISTORY OF PRESENTING ILLNESS:  [name] 75 y.o. male is a here because of thrombocytopenia. The patient was referred by his PCP. The patient presents to the clinic today by himself.   He had normal CBC in 2008, no records between 2008-2021. He gets health care at [facility] clinic in [place]. His CBC since 06/2020 showed plt in range of 66-158K, he has intermittent mild ecchymosis on his arms, recently 1 in the [place] chest, no other active bleeding.  He was previously on Eliquis for a few months earlier this year for atrial fibrillation, now on baby aspirin.  He has significant cardiac history. He has been seen cardiologist Dr. Percival Spanish since 2013 for nonischemic cardiomyopathy with EF in 30%range, which has dropped further to 25% lately.  He had a further work-up in late 2021, including cardiac cath which was negative, and cardiac MRI which was suggestive of sarcoidosis.  He had ICD placed in April 6568, which was complicated with infection.  He was then referred to see cardiologist at the [facility], cardiac biopsy was recommended, and will likely be scheduled next months.  He is clinically doing well overall, no significant chest pain, dyspnea or orthopnea, no leg edema.  He  functions well at home.   He lives alone, his son lives in [place].  He has other children in out states.   REVIEW OF SYSTEMS:   Constitutional: Denies fevers, chills or abnormal night sweats Eyes: Denies blurriness of vision, double vision or watery eyes Ears, nose, mouth, throat, and face: Denies mucositis or sore throat Respiratory: Denies cough, dyspnea or wheezes Cardiovascular: Denies palpitation, chest discomfort or lower extremity swelling Gastrointestinal:  Denies nausea, heartburn or change in bowel habits Skin: Denies abnormal skin rashes Lymphatics: Denies new lymphadenopathy or easy bruising Neurological:Denies numbness, tingling or new weaknesses Behavioral/Psych: Mood is stable, no new changes  All other systems were reviewed with the patient and are negative.   MEDICAL HISTORY:  Past Medical History:  Diagnosis Date   ABDOMINAL AORTIC ANEURYSM    CAD    Non obstructive (mild) cath 2007   CHF    EF 15% in 2007, 25% currently   Diabetes mellitus without complication ([place])    GLAUCOMA    HYPERTENSION    TOBACCO ABUSE    Previous    SURGICAL HISTORY: Past Surgical History:  Procedure Laterality Date   BIV ICD INSERTION CRT-D N/A 12/06/2020   Procedure: BIV ICD INSERTION CRT-D;  Surgeon: Vickie Epley, MD;  Location: [place] [facility];  Service: Cardiovascular;  Laterality: N/A;   Clavical Repair     CORNEAL TRANSPLANT     Right eye x 2   EYE SURGERY     Glaucoma   POCKET REVISION/RELOCATION N/A 12/27/2020   Procedure: POCKET REVISION/RELOCATION;  Surgeon: Vickie Epley, MD;  Location: [place] [facility];  Service: Cardiovascular;  Laterality: N/A;   RIGHT HEART CATH N/A 12/05/2020   Procedure: RIGHT HEART CATH;  Surgeon: Jolaine Artist, MD;  Location: [place] [facility];  Service: Cardiovascular;  Laterality: N/A;   RIGHT/LEFT HEART CATH AND CORONARY ANGIOGRAPHY N/A 08/08/2020   Procedure: RIGHT/LEFT HEART CATH AND CORONARY ANGIOGRAPHY;   Surgeon: Jolaine Artist, MD;  Location: [place] [facility];  Service: Cardiovascular;  Laterality: N/A;    SOCIAL HISTORY: Social History   Socioeconomic History   Marital status: Divorced    Spouse name: Not on file   Number of children: 5   Years of education: high school graduate with 3 years of college   Highest education level: Some college, no degree  Occupational History   Occupation: part time Nurse, 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    Comment: Veteran  Tobacco Use   Smoking status: Some Days    Packs/day: 2.00    Years: 35.00    Pack years: 70.00    Types: Cigarettes    Last attempt to quit: 03/08/2008    Years since quitting: 12.9   Smokeless tobacco: Former   Tobacco comments:    Has not smoked in 2 months   Vaping Use   Vaping Use: Never used  Substance and Sexual Activity   Alcohol use: No    Comment: used to deink alcohol moderately, stopped in 2019   Drug use: Yes    Types: Marijuana    Comment: occasional   Sexual activity: Not on file  Other Topics Concern   Not on file  Social History Narrative   single male who lives alone but with the support of his daughter Matthewjames Petrasek. He is doing well with his care needs    He used to work in [employer] part time, stopped in Dec 2021   high school graduate with 3 years of college   5 children    Male pit bull in home- [place] relative is daughter Shonta Phillis, and she lives here in [place].  He has not done advanced directives   Social Determinants of Health   Financial Resource Strain: Medium Risk   Difficulty of Paying Living Expenses: Somewhat hard  Food Insecurity: No Food Insecurity   Worried About Charity fundraiser in the Last Year: Never true   Ran Out of Food in the Last Year: Never true  Transportation Needs: No Transportation Needs   Lack of Transportation (Medical): No   Lack of Transportation (Non-Medical): No  Physical Activity: Not on file  Stress: No Stress Concern Present    Feeling of Stress : Only a little  Social Connections: Moderately Integrated   Frequency of Communication with Friends and Family: Twice a week   Frequency of Social Gatherings with Friends and Family: Twice a week   Attends Religious Services: 1 to 4 times per year   Active Member of [employer] or Organizations: Yes   Attends Archivist Meetings: 1 to 4 times per year   Marital Status: Divorced  Human resources officer Violence: Not At Risk   Fear of Current or Ex-Partner: No   Emotionally Abused: No   Physically Abused: No   Sexually Abused: No    FAMILY HISTORY: Family History  Problem Relation Age of Onset   Diabetes Mother    Diabetes Sister    Diabetes Brother     ALLERGIES:  has No Known Allergies.  MEDICATIONS:  Current Outpatient Medications  Medication Sig Dispense Refill   amiodarone (  PACERONE) 200 MG tablet Take 200 mg by mouth daily.     aspirin 81 MG EC tablet Take 1 tablet (81 mg total) by mouth daily. 30 tablet 11   atorvastatin (LIPITOR) 40 MG tablet Take 40 mg by mouth daily.     cholecalciferol (VITAMIN D) 1000 UNITS tablet Take 1,000 Units by mouth 2 (two) times daily.      empagliflozin (JARDIANCE) 25 MG TABS tablet Take 25 mg by mouth daily.     lidocaine (XYLOCAINE) 2 % solution Use as directed 10 mLs in the mouth or throat every 4 (four) hours as needed for mouth pain. Swish, gargle, and spit 200 mL 0   methotrexate (RHEUMATREX) 2.5 MG tablet Take 4 tablets (10 mg total) by mouth once a week. 24 tablet 3   metoprolol succinate (TOPROL-XL) 50 MG 24 hr tablet Take 50 mg by mouth daily.     nystatin (MYCOSTATIN) 100000 UNIT/ML suspension Take 2 mLs (200,000 Units total) by mouth 4 (four) times daily. 18ml to each cheek QID x 7 days 100 mL 0   potassium chloride 20 MEQ TBCR Take 1 tablet with Lasix. 90 tablet 3   predniSONE (DELTASONE) 10 MG tablet Take 10 mg by mouth in the morning, at noon, and at bedtime.     spironolactone (ALDACTONE) 25 MG tablet Take 1  tablet (25 mg total) by mouth daily. 90 tablet 3   sulfamethoxazole-trimethoprim (BACTRIM DS) 800-160 MG tablet Take 1 tablet by mouth 3 (three) times a week. (Patient not taking: Reported on 02/06/2021) 12 tablet 3   No current facility-administered medications for this visit.    PHYSICAL EXAMINATION: ECOG PERFORMANCE STATUS: 0 - Asymptomatic  Vitals:   02/18/21 1041  BP: 130/88  Pulse: 72  Resp: 18  Temp: (!) 97.5 F (36.4 C)  SpO2: 100%   Filed Weights   02/18/21 1041  Weight: 161 lb 3.2 oz (73.1 kg)    GENERAL:alert, no distress and comfortable SKIN: skin color, texture, turgor are normal, no rashes or significant lesions except a moderate sized ecchymosis in mid chest.  EYES: normal, Conjunctiva are pink and non-injected, sclera clear NECK: supple, thyroid normal size, non-tender, without nodularity LYMPH:  no palpable lymphadenopathy in the cervical, axillary  LUNGS: clear to auscultation and percussion with normal breathing effort HEART: regular rate & rhythm and no murmurs and no lower extremity edema ABDOMEN:abdomen soft, non-tender and normal bowel sounds Musculoskeletal:no cyanosis of digits and no clubbing  NEURO: alert & oriented x 3 with fluent speech, no focal motor/sensory deficits  LABORATORY DATA:  I have reviewed the data as listed CBC Latest Ref Rng & Units 02/18/2021 02/06/2021 12/05/2020  WBC 4.0 - 10.5 K/uL 9.8 8.3 -  Hemoglobin 13.0 - 17.0 g/dL 14.7 14.8 16.7  Hematocrit 39.0 - 52.0 % 43.5 43.3 49.0  Platelets 150 - 400 K/uL 99(L) 75(LL) -    CMP Latest Ref Rng & Units 01/08/2021 12/13/2020 12/07/2020  Glucose 65 - 99 mg/dL 117(H) 91 127(H)  BUN 8 - 27 mg/dL 48(H) 23 44(H)  Creatinine 0.76 - 1.27 mg/dL 1.64(H) 1.56(H) 1.80(H)  Sodium 134 - 144 mmol/L 134 136 136  Potassium 3.5 - 5.2 mmol/L 5.1 4.2 4.7  Chloride 96 - 106 mmol/L 105 108 102  CO2 20 - 29 mmol/L 17(L) 23 23  Calcium 8.6 - 10.2 mg/dL 9.9 8.8(L) 8.9  Total Protein 6.0 - 8.5 g/dL 6.2 - -   Total Bilirubin 0.0 - 1.2 mg/dL 0.4 - -  Alkaline Phos  44 - 121 IU/L 88 - -  AST 0 - 40 IU/L 22 - -  ALT 0 - 44 IU/L 34 - -    OUTSIDE LABS   07/2020  HG 14.5 HCT 44.6% PLT 107K WBC 8.04   RADIOGRAPHIC STUDIES: I have personally reviewed the radiological images as listed and agreed with the findings in the report. No results found.  ASSESSMENT & PLAN:  Ory Elting is a 76 y.o. African American male with a history of nonischemic cardiomyopathy with EF 25%, AAA, CKD, Glaucoma, HTN, Tobacco [place]    1 chronic mild thrombocytopenia  -His medical records showed normal CBC in 2008 and mild thrombocytopenia with platelet around 100K since 06/2020, no lab results available between 2008 and 2021.  His WBC and hemoglobin has been normal. -I discussed the common etiology for chronic mild thrombocytopenia, which include autoimmune related (ITP), liver disease, splenomegaly, medication or alcohol induced, chronic infection such as hepatitis C and HIV, and bone marrow disease such as MDS, lymphoma or multiple myeloma. The other etiology such as infection, malignancy, microangiopathy, DIC a less likely given the indolent course and his clinical presentation. -His previous lab was negative for hepatitis B, C, HIV, folate and B12 level were normal recently.  He does not drink alcohol regularly.  No previous liver disease.  He is scheduled for abdominal ultrasound in a few weeks.  -His recent labs showed detectable M protein, he has chronic kidney disease, no anemia, hypercalcemia, bone pain or other concerning signs of multiple myeloma.  However given his thrombocytopenia, nonischemic cardiomyopathy (presumably sarcoidosis related), amyloidosis and multiple myeloma needs to be ruled out. I recommend a bone marrow biopsy for furhter evaluation. I reviewed the procedure with pt, he is reluctant to have biopsy.  Since he is going to have cardiac biopsy next months, I will order a bone marrow biopsy for now.   Amyloidosis could be diagnosed based on heart biopsy  -The other etiology is chronic ITP, which is a diagnosis of exclusion. Will check immature platelet count today.  -We discussed the risk of bleeding from thrombocytopenia. Giving the mild degree of thrombocytopenia, the risk of bleeding is not very high. But he knows to avoid injury.  -I'll see him back in 6-8 weeks with a repeat CBC.  2. HTN, atrial fibrillation, nonischemic cardiomyopathy with EF 25% -Follow-up with cardiology  PLAN:  -CBC and immature platelet count today -I recommended bone marrow biopsy to rule out amyloidosis and multiple myeloma, patient would like to hold it until he has endomyocardial biopsy next month -I will request old lab (CBC) from [place] clinic in [place]  -I will see him back in 6 to 8 weeks with repeated CBC.    Orders Placed This Encounter  Procedures   CBC with Differential ([place] Only)    Standing Status:   Future    Number of Occurrences:   1    Standing Expiration Date:   02/18/2022   Immature Platelet Fraction    Standing Status:   Future    Number of Occurrences:   1    Standing Expiration Date:   02/18/2022    All questions were answered. The patient knows to call the clinic with any problems, questions or concerns. The total time spent in the appointment was 45 minutes.     Truitt Merle, MD 02/18/2021 6:29 PM  I, Joslyn Devon, am acting as scribe for Truitt Merle, MD.   I have reviewed the above documentation for accuracy and  completeness, and I agree with the above.

## 2021-02-17 NOTE — Telephone Encounter (Signed)
Received a new hem referral from [place] for thrombocytopenia. [name] returned my call and has been scheduled to see Dr. Burr Medico on 6/28 at 10:20am. Pt aware to arrive 20 minutes early.

## 2021-02-18 ENCOUNTER — Inpatient Hospital Stay: Payer: Medicare Other | Attending: Hematology | Admitting: Hematology

## 2021-02-18 ENCOUNTER — Other Ambulatory Visit: Payer: Self-pay

## 2021-02-18 ENCOUNTER — Telehealth: Payer: Self-pay | Admitting: Hematology

## 2021-02-18 ENCOUNTER — Inpatient Hospital Stay: Payer: Medicare Other

## 2021-02-18 ENCOUNTER — Encounter: Payer: Self-pay | Admitting: Hematology

## 2021-02-18 VITALS — BP 130/88 | HR 72 | Temp 97.5°F | Resp 18 | Wt 161.2 lb

## 2021-02-18 DIAGNOSIS — I2510 Atherosclerotic heart disease of native coronary artery without angina pectoris: Secondary | ICD-10-CM | POA: Diagnosis not present

## 2021-02-18 DIAGNOSIS — I509 Heart failure, unspecified: Secondary | ICD-10-CM | POA: Insufficient documentation

## 2021-02-18 DIAGNOSIS — F17210 Nicotine dependence, cigarettes, uncomplicated: Secondary | ICD-10-CM | POA: Insufficient documentation

## 2021-02-18 DIAGNOSIS — I4891 Unspecified atrial fibrillation: Secondary | ICD-10-CM | POA: Diagnosis not present

## 2021-02-18 DIAGNOSIS — E1122 Type 2 diabetes mellitus with diabetic chronic kidney disease: Secondary | ICD-10-CM | POA: Insufficient documentation

## 2021-02-18 DIAGNOSIS — D869 Sarcoidosis, unspecified: Secondary | ICD-10-CM

## 2021-02-18 DIAGNOSIS — I130 Hypertensive heart and chronic kidney disease with heart failure and stage 1 through stage 4 chronic kidney disease, or unspecified chronic kidney disease: Secondary | ICD-10-CM | POA: Insufficient documentation

## 2021-02-18 DIAGNOSIS — Z7901 Long term (current) use of anticoagulants: Secondary | ICD-10-CM | POA: Diagnosis not present

## 2021-02-18 DIAGNOSIS — D696 Thrombocytopenia, unspecified: Secondary | ICD-10-CM | POA: Insufficient documentation

## 2021-02-18 DIAGNOSIS — N1831 Chronic kidney disease, stage 3a: Secondary | ICD-10-CM | POA: Insufficient documentation

## 2021-02-18 DIAGNOSIS — Z79899 Other long term (current) drug therapy: Secondary | ICD-10-CM | POA: Insufficient documentation

## 2021-02-18 DIAGNOSIS — Z833 Family history of diabetes mellitus: Secondary | ICD-10-CM | POA: Insufficient documentation

## 2021-02-18 DIAGNOSIS — I428 Other cardiomyopathies: Secondary | ICD-10-CM | POA: Insufficient documentation

## 2021-02-18 LAB — CBC WITH DIFFERENTIAL (CANCER CENTER ONLY)
Abs Immature Granulocytes: 0.53 10*3/uL — ABNORMAL HIGH (ref 0.00–0.07)
Basophils Absolute: 0.1 10*3/uL (ref 0.0–0.1)
Basophils Relative: 1 %
Eosinophils Absolute: 0 10*3/uL (ref 0.0–0.5)
Eosinophils Relative: 0 %
HCT: 43.5 % (ref 39.0–52.0)
Hemoglobin: 14.7 g/dL (ref 13.0–17.0)
Immature Granulocytes: 5 %
Lymphocytes Relative: 7 %
Lymphs Abs: 0.7 10*3/uL (ref 0.7–4.0)
MCH: 35.9 pg — ABNORMAL HIGH (ref 26.0–34.0)
MCHC: 33.8 g/dL (ref 30.0–36.0)
MCV: 106.1 fL — ABNORMAL HIGH (ref 80.0–100.0)
Monocytes Absolute: 0.4 10*3/uL (ref 0.1–1.0)
Monocytes Relative: 4 %
Neutro Abs: 8.1 10*3/uL — ABNORMAL HIGH (ref 1.7–7.7)
Neutrophils Relative %: 83 %
Platelet Count: 99 10*3/uL — ABNORMAL LOW (ref 150–400)
RBC: 4.1 MIL/uL — ABNORMAL LOW (ref 4.22–5.81)
RDW: 20.2 % — ABNORMAL HIGH (ref 11.5–15.5)
WBC Count: 9.8 10*3/uL (ref 4.0–10.5)
nRBC: 0.9 % — ABNORMAL HIGH (ref 0.0–0.2)

## 2021-02-18 LAB — IMMATURE PLATELET FRACTION: Immature Platelet Fraction: 5.5 % (ref 1.2–8.6)

## 2021-02-18 NOTE — Telephone Encounter (Signed)
Scheduled per los. Gave avs and calendar  

## 2021-02-26 ENCOUNTER — Other Ambulatory Visit: Payer: Self-pay | Admitting: *Deleted

## 2021-02-26 NOTE — Patient Outreach (Signed)
[name]) Care Management  02/26/2021  [name] [birth year] [id number]   [facility] attempted follow up outreach call to patient.  Patient was unavailable. HIPPA compliance voicemail message left with return callback number.  Plan: RN will call patient again within 30 days.  [name] Care Management [phone]

## 2021-02-28 ENCOUNTER — Ambulatory Visit
Admission: RE | Admit: 2021-02-28 | Discharge: 2021-02-28 | Disposition: A | Discharge status: Home / Self Care | Payer: Medicare Other | Source: Ambulatory Visit | Attending: Medical | Admitting: Medical

## 2021-02-28 DIAGNOSIS — D696 Thrombocytopenia, unspecified: Secondary | ICD-10-CM

## 2021-02-28 DIAGNOSIS — D869 Sarcoidosis, unspecified: Secondary | ICD-10-CM

## 2021-03-10 ENCOUNTER — Ambulatory Visit (INDEPENDENT_AMBULATORY_CARE_PROVIDER_SITE_OTHER): Payer: Medicare Other

## 2021-03-10 DIAGNOSIS — I429 Cardiomyopathy, unspecified: Secondary | ICD-10-CM | POA: Diagnosis not present

## 2021-03-11 DIAGNOSIS — Z95810 Presence of automatic (implantable) cardiac defibrillator: Secondary | ICD-10-CM | POA: Insufficient documentation

## 2021-03-11 LAB — CUP PACEART REMOTE DEVICE CHECK
Battery Remaining Longevity: 47 mo
Battery Remaining Percentage: 92 %
Battery Voltage: 2.96 V
Brady Statistic AP VP Percent: 81 %
Brady Statistic AP VS Percent: 2.6 %
Brady Statistic AS VP Percent: 14 %
Brady Statistic AS VS Percent: 1.3 %
Brady Statistic RA Percent Paced: 83 %
Date Time Interrogation Session: 20220718020024
HighPow Impedance: 53 Ohm
Implantable Lead Implant Date: 20220415
Implantable Lead Implant Date: 20220415
Implantable Lead Implant Date: 20220415
Implantable Lead Location: 753858
Implantable Lead Location: 753859
Implantable Lead Location: 753860
Implantable Pulse Generator Implant Date: 20220415
Lead Channel Impedance Value: 380 Ohm
Lead Channel Impedance Value: 500 Ohm
Lead Channel Impedance Value: 710 Ohm
Lead Channel Pacing Threshold Amplitude: 0.75 V
Lead Channel Pacing Threshold Amplitude: 0.75 V
Lead Channel Pacing Threshold Amplitude: 1.5 V
Lead Channel Pacing Threshold Pulse Width: 0.5 ms
Lead Channel Pacing Threshold Pulse Width: 0.5 ms
Lead Channel Pacing Threshold Pulse Width: 0.5 ms
Lead Channel Sensing Intrinsic Amplitude: 5 mV
Lead Channel Sensing Intrinsic Amplitude: 7.2 mV
Lead Channel Setting Pacing Amplitude: 3.5 V
Lead Channel Setting Pacing Amplitude: 3.5 V
Lead Channel Setting Pacing Amplitude: 3.5 V
Lead Channel Setting Pacing Pulse Width: 0.5 ms
Lead Channel Setting Pacing Pulse Width: 0.5 ms
Lead Channel Setting Sensing Sensitivity: 0.5 mV
Pulse Gen Serial Number: 111040714

## 2021-03-12 ENCOUNTER — Encounter: Payer: Self-pay | Admitting: Internal Medicine

## 2021-03-12 ENCOUNTER — Other Ambulatory Visit: Payer: Self-pay

## 2021-03-12 ENCOUNTER — Ambulatory Visit (INDEPENDENT_AMBULATORY_CARE_PROVIDER_SITE_OTHER): Payer: Medicare Other | Admitting: Internal Medicine

## 2021-03-12 VITALS — BP 108/72 | HR 73 | Ht 71.0 in | Wt 153.0 lb

## 2021-03-12 DIAGNOSIS — I4891 Unspecified atrial fibrillation: Secondary | ICD-10-CM

## 2021-03-12 DIAGNOSIS — I428 Other cardiomyopathies: Secondary | ICD-10-CM

## 2021-03-12 DIAGNOSIS — D8685 Sarcoid myocarditis: Secondary | ICD-10-CM | POA: Diagnosis not present

## 2021-03-12 DIAGNOSIS — I5022 Chronic systolic (congestive) heart failure: Secondary | ICD-10-CM

## 2021-03-12 DIAGNOSIS — Z95810 Presence of automatic (implantable) cardiac defibrillator: Secondary | ICD-10-CM | POA: Diagnosis not present

## 2021-03-12 NOTE — Patient Instructions (Signed)
Medication Instructions:  Your physician has recommended you make the following change in your medication:   ** Decrease your Amiodarone 200mg  to 1 tablet daily x 5 days per week.  Monday- Friday  *If you need a refill on your cardiac medications before your next appointment, please call your pharmacy*   Lab Work: None ordered.  If you have labs (blood work) drawn today and your tests are completely normal, you will receive your results only by: [place] (if you have MyChart) OR A paper copy in the mail If you have any lab test that is abnormal or we need to change your treatment, we will call you to review the results.   Testing/Procedures: None ordered.    Follow-Up: At [name], you and your health needs are our priority.  As part of our continuing mission to provide you with exceptional heart care, we have created designated Provider Care Teams.  These Care Teams include your primary Cardiologist (physician) and Advanced Practice Providers (APPs -  Physician Assistants and Nurse Practitioners) who all work together to provide you with the care you need, when you need it.  We recommend signing up for the patient portal called "MyChart".  Sign up information is provided on this After Visit Summary.  MyChart is used to connect with patients for Virtual Visits (Telemedicine).  Patients are able to view lab/test results, encounter notes, upcoming appointments, etc.  Non-urgent messages can be sent to your provider as well.   To learn more about what you can do with MyChart, go to [email or web address].    Your next appointment:   6 month(s)  The format for your next appointment:   In Person  Provider:   Virl Axe, MD

## 2021-03-12 NOTE — Progress Notes (Signed)
Patient ID: [name], male   DOB: 06-09-1946, 75 y.o.   MRN: [id number]      Patient Care Team: Caryl Ada as PCP - General (Family Medicine) Minus Breeding, MD as PCP - Cardiology (Cardiology) Deboraha Sprang, MD as PCP - Electrophysiology (Cardiology) Minus Breeding, MD as Consulting Physician (Cardiology) Minus Breeding, MD as Consulting Physician (Cardiology) Hiram Gash, MD as Referring Physician (Cardiology) Sherran Needs, NP as Nurse Practitioner (Nurse Practitioner) Pleasant, Eppie Gibson, RN as [place] Management   HPI  [name] is a 75 y.o. male seen in follow-up for CRT-ICD for primary prevention implanted by Dr. [name] Perch 4/22 in the setting of nonischemic cardiomyopathy and sarcoid-suspected based on cMRI as well as abnormal PET currently managed at [facility] with prednisone  and methotrexate.  He has atrial fibrillation previously on Tikosyn now on amiodarone.  Anticoagulation not-- on ASA   On guideline directed medical therapy with persistence of left ventricular dysfunction 3/22 had presented with atrial fibrillation with a rapid rate and decompensated heart failure.  Elected to proceed with TEE guided cardioversion; had reverted to sinus.  Then went back into atrial fibrillation admitted for Tikosyn but was intolerant with excessive QT prolongation.  He was started on amiodarone.  pocket hematoma with blood extravasation noted at [facility] and pocket evacuation accomplished here by Dr. [name] Perch 12/27/2020.  Report was reviewed.  No evidence of infection; antimicrobial pouch was used   Today, the patient denies chest pain, shortness of breath, nocturnal dyspnea, orthopnea or peripheral edema.  There have been no palpitations, lightheadedness or syncope.    He had a biopsy done 03/03/21 for Sarcoid; No findings found but PET scans suggest otherwise with abnormal results   He would like to get off his prednisone   DATE TEST EF%    2008 Echo 35-40%     5/13 Echo  30-35%    7/19 Echo  20-25%    12/21 cMRI 24% Concerning Sarcoid  12/21 LHC   CAD mod nonobst  7/82 PET   Metabolic active sarcoid  4/23 RHC    5/22(CE) PET  Abnormal glucose Uptake ? "Lack of suppression vs diffuse inflammation   7/22 Bx  Diffuse "fibrosis, a remote/healed sarcoid or myocarditis cannot be excluded "    Date Cr K Hgb TSH AST  3/22 1.72 5.0 12.3 (12/21)    4/22 1.94<<1.56 4.2 16  29   5/22 1.64 5.1 14.7(6/22) 0.639  22  7/22         Records and Results Reviewed   Past Medical History:  Diagnosis Date   ABDOMINAL AORTIC ANEURYSM    CAD    Non obstructive (mild) cath 2007   CHF    EF 15% in 2007, 25% currently   Diabetes mellitus without complication ([place])    GLAUCOMA    HYPERTENSION    TOBACCO ABUSE    Previous    Past Surgical History:  Procedure Laterality Date   BIV ICD INSERTION CRT-D N/A 12/06/2020   Procedure: BIV ICD INSERTION CRT-D;  Surgeon: Vickie Epley, MD;  Location: [place] [facility];  Service: Cardiovascular;  Laterality: N/A;   Clavical Repair     CORNEAL TRANSPLANT     Right eye x 2   EYE SURGERY     Glaucoma   POCKET REVISION/RELOCATION N/A 12/27/2020   Procedure: POCKET REVISION/RELOCATION;  Surgeon: Vickie Epley, MD;  Location: [place] [facility];  Service: Cardiovascular;  Laterality: N/A;   RIGHT  HEART CATH N/A 12/05/2020   Procedure: RIGHT HEART CATH;  Surgeon: Jolaine Artist, MD;  Location: [place] [facility];  Service: Cardiovascular;  Laterality: N/A;   RIGHT/LEFT HEART CATH AND CORONARY ANGIOGRAPHY N/A 08/08/2020   Procedure: RIGHT/LEFT HEART CATH AND CORONARY ANGIOGRAPHY;  Surgeon: Jolaine Artist, MD;  Location: [place] [facility];  Service: Cardiovascular;  Laterality: N/A;    Current Meds  Medication Sig   amiodarone (PACERONE) 200 MG tablet Take 200 mg by mouth daily.   aspirin 81 MG EC tablet Take 1 tablet (81 mg total) by mouth daily.   atorvastatin (LIPITOR) 40 MG tablet Take 40 mg  by mouth daily.   cholecalciferol (VITAMIN D) 1000 UNITS tablet Take 1,000 Units by mouth 2 (two) times daily.    empagliflozin (JARDIANCE) 25 MG TABS tablet Take 25 mg by mouth daily.   lidocaine (XYLOCAINE) 2 % solution Use as directed 10 mLs in the mouth or throat every 4 (four) hours as needed for mouth pain. Swish, gargle, and spit   methotrexate (RHEUMATREX) 2.5 MG tablet Take 4 tablets (10 mg total) by mouth once a week.   metoprolol succinate (TOPROL-XL) 50 MG 24 hr tablet Take 50 mg by mouth daily.   nystatin (MYCOSTATIN) 100000 UNIT/ML suspension Take 2 mLs (200,000 Units total) by mouth 4 (four) times daily. 6ml to each cheek QID x 7 days   potassium chloride 20 MEQ TBCR Take 1 tablet with Lasix.   predniSONE (DELTASONE) 10 MG tablet Take 10 mg by mouth in the morning, at noon, and at bedtime.   spironolactone (ALDACTONE) 25 MG tablet Take 1 tablet (25 mg total) by mouth daily.   sulfamethoxazole-trimethoprim (BACTRIM DS) 800-160 MG tablet Take 1 tablet by mouth 3 (three) times a week.    No Known Allergies  Review of Systems negative except from HPI and PMH  Physical Exam: BP 108/72   Pulse 73   Ht 5\' 11"  (1.803 m)   Wt 153 lb (69.4 kg)   SpO2 98%   BMI 21.34 kg/m  Well developed and well nourished in no acute distress HENT normal Neck supple with JVP-flat Lungs Clear Device pocket well healed; without hematoma or erythema.  There is no tethering  Regular rate and rhythm, no  gallop No  murmur S1 diminished Abd-soft with active BS No Clubbing cyanosis No edema Skin-warm and dry A & Oriented  Grossly normal sensory and motor function  ECG: AV pacing 73 22/14/43 Upright QRS lead v1 AND NEG LEAD 1  Freq PVCs  Assessment and  Plan  Cardiac sarcoid   Trifascicular block  Ventricular tachycardia-sustained   Atrial fibrillation RVR   Nonischemic cardiomyopathy  Congestive heart failure chronic/systolic  High Risk Medication Surveillance   Tolerating  amiodarone.  No interval atrial fibrillation.  We will decrease today from 1400-1000 mg a week.  Surveillance laboratories were normal May 22  No interval ventricular arrhythmias.  We will decrease amiodarone as noted above  Heart failure status is stable.  No evidence of volume overload.  We will continue him on his Jardiance, spironolactone and metoprolol. Is supposed to be on Entresto as best as I can tell; it is his understanding that it was stopped by the heart failure clinic.  We will clarify.  Dr. Haroldine Laws and [facility] has been in conversation regarding clarification of his diagnosis.  Persistently abnormal PET scan despite prednisone and methotrexate.  Biopsy showed no active sarcoid.  Mostly chronic and scarring.   I,Stephanie Williams,acting  as a scribe for Virl Axe, MD.,have documented all relevant documentation on the behalf of Virl Axe, MD,as directed by  Virl Axe, MD while in the presence of Virl Axe, MD.  I, Virl Axe, MD, have reviewed all documentation for this visit. The documentation on 03/12/21 for the exam, diagnosis, procedures, and orders are all accurate and complete.

## 2021-03-21 ENCOUNTER — Encounter: Payer: Medicare Other | Admitting: Cardiology

## 2021-03-31 ENCOUNTER — Other Ambulatory Visit: Payer: Self-pay | Admitting: *Deleted

## 2021-03-31 NOTE — Patient Instructions (Signed)
Goals Addressed               This Visit's Progress     (THN) Monitor and Manage My Blood Sugar-Diabetes Type 2 (pt-stated)   On track     Timeframe:  Short-Term Goal Priority:  Medium Start Date:                      11/28/20       Expected End Date:     [id number] Follow up outreach [id number]                 - check blood sugar at prescribed times - check blood sugar if I feel it is too high or too low - enter blood sugar readings and medication or insulin into daily log   Barriers: Knowledge    Why is this important?   Checking your blood sugar at home helps to keep it from getting very high or very low.  Writing the results in a diary or log helps the doctor know how to care for you.  Your blood sugar log should have the time, date and the results.  Also, write down the amount of insulin or other medicine that you take.  Other information, like what you ate, exercise done and how you were feeling, will also be helpful.     Notes:  01/27/21 continues to report his blood sugars are in the 100s avg 109 01/03/21  reported to be managed at home 100's 12/26/20 cbg value 108 voiced understood about DM sick expectations and home care  Has all Dm medication issues resolved via [place] 11/28/20 checking cbg every Thursday per pt. Last check on 11/21/20 =86, not checked today 11/28/20 [id number] Patient is monitoring  blood sugars on thursdays. His A1C is 6.1      [facility]) Track and Manage Heart Rate and Rhythm-Atrial Fibrillation (pt-stated)   On track     Timeframe:  Short-Term Goal Priority:  Medium Start Date:         [id number]       Expected End Date:    [id number]                  Follow up date [id number]   - check pulse (heart) rate once a day - take medicine as prescribed    Why is this important?   Atrial fibrillation may have no symptoms. Sometimes the symptoms get worse or happen more often.  It is important to keep track of what your symptoms are and when they happen.  A change in  symptoms is important to discuss with your doctor or nurse.  Being active and healthy eating will also help you manage your heart condition.     Notes:  01/27/21 Received education on average heart rate/defib, voiced understanding of elevated heart rates during hospitalization, denies chest pain/discomfort aware of worsening symptoms to report to MD  [id number] Per patient he is feeling good. No symptoms exhibited      [facility]) Track and Manage Symptoms-Heart Failure (pt-stated)   On track     Timeframe:  Short-Term Goal Priority:  Medium Start Date:         08/30/20                    Expected End Date:     [id number]       Follow up date pending [name] disease management outreach  - develop a rescue plan - know  when to call the doctor - track symptoms and what helps feel better or worse   Barriers: Knowledge   Why is this important?   You will be able to handle your symptoms better if you keep track of them.  Making some simple changes to your lifestyle will help.  Eating healthy is one thing you can do to take good care of yourself.    Notes:  01/27/21 with CHF/CKD s/s of edema of ankle and feet for 6 days wt per pt remains unchanged, taking Spironolactone as ordered He weighs and documents daily. Denies shortness of breath, chest pain or dizziness Pt to call cardiology for worsening symptoms. Agrees to [facility] disease management services Pt to speak with pcp or cardiology about CKD follow up 01/03/21 daily weights around 147 lbs  12/26/20 12/25/20 confirmed infection of defib site. Saw MD 12/26/20 planned pending procedure 12/27/20 11/28/20  No worsening CHF symptoms [id number] Patient is monitoring swelling in lower extremities      Track and Manage Activity and Exertion-Heart Failure   On track     Timeframe:  Long-Range Goal Priority:  Medium Start Date: [id number]                            Expected End Date:    [id number]                   Follow Up Date [id number]    - follow activity or exercise  plan    Why is this important?   Exercising is very important when managing your heart failure.  It will help your heart get stronger.    Notes:  [id number] Patient is mowing the grass for exercises      Track and Manage Fluids and Swelling-Heart Failure   On track     Timeframe:  Long-Range Goal Priority:  Medium Start Date: [id number]                             Expected End Date: [id number]                        Follow Up Date [id number]   - call office if I gain more than 2 pounds in one day or 5 pounds in one week - track weight in diary - use salt in moderation - watch for swelling in feet, ankles and legs every day - weigh myself daily    Why is this important?   It is important to check your weight daily and watch how much salt and liquids you have.  It will help you to manage your heart failure.    Notes:  [id number] Patient is monitoring diet

## 2021-03-31 NOTE — Patient Outreach (Signed)
[facility]) Care Management  [name]  03/31/2021   [name] 1945/10/02 [id number]  RN Health Coach telephone call to patient.  [employer] compliance verified. Per patient he is doing good. Patient stated he has gained some weight from the prednisone. He is monitoring his weight daily.  He is taking medications as per ordered. Per patient he is currently still driving. Patient has been mowing. Patient has agreed to follow up outreach calls.   Encounter Medications:  Outpatient Encounter Medications as of 03/31/2021  Medication Sig   amiodarone (PACERONE) 200 MG tablet Take 200 mg by mouth as directed. Take 1 tablet by mouth Monday-Friday   aspirin 81 MG EC tablet Take 1 tablet (81 mg total) by mouth daily.   atorvastatin (LIPITOR) 40 MG tablet Take 40 mg by mouth daily.   cholecalciferol (VITAMIN D) 1000 UNITS tablet Take 1,000 Units by mouth 2 (two) times daily.    empagliflozin (JARDIANCE) 25 MG TABS tablet Take 25 mg by mouth daily.   lidocaine (XYLOCAINE) 2 % solution Use as directed 10 mLs in the mouth or throat every 4 (four) hours as needed for mouth pain. Swish, gargle, and spit   methotrexate (RHEUMATREX) 2.5 MG tablet Take 4 tablets (10 mg total) by mouth once a week.   metoprolol succinate (TOPROL-XL) 50 MG 24 hr tablet Take 50 mg by mouth daily.   nystatin (MYCOSTATIN) 100000 UNIT/ML suspension Take 2 mLs (200,000 Units total) by mouth 4 (four) times daily. 26m to each cheek QID x 7 days   potassium chloride 20 MEQ TBCR Take 1 tablet with Lasix.   predniSONE (DELTASONE) 10 MG tablet Take 10 mg by mouth in the morning, at noon, and at bedtime.   spironolactone (ALDACTONE) 25 MG tablet Take 1 tablet (25 mg total) by mouth daily.   sulfamethoxazole-trimethoprim (BACTRIM DS) 800-160 MG tablet Take 1 tablet by mouth 3 (three) times a week.   No facility-administered encounter medications on file as of 03/31/2021.    Functional Status:  In your present  state of health, do you have any difficulty performing the following activities: 12/03/2020 08/06/2020  Hearing? N N  Vision? N N  Difficulty concentrating or making decisions? N N  Walking or climbing stairs? N N  Dressing or bathing? N N  Doing errands, shopping? N N  Some recent data might be hidden    Fall/Depression Screening: Fall Risk  01/27/2021 01/02/2021 07/13/2018  Falls in the past year? 0 0 0  Comment - - Emmi Telephone Survey: data to providers prior to load  Number falls in past yr: 0 0 -  Injury with Fall? 0 0 -  Risk for fall due to : - No Fall Risks -  Follow up Falls evaluation completed;Education provided Falls evaluation completed -   PHQ 2/9 Scores 01/22/2021 12/10/2020 11/28/2020 08/30/2020 08/22/2020  PHQ - 2 Score 0 0 0 0 0    Assessment:   Care Plan Care Plan : Heart Failure (Adult)  Updates made by PVerlin Grills RN since 03/31/2021 12:00 AM     Problem: Disease Progression (Heart Failure)   Priority: Medium  Onset Date: 08/30/2020     Long-Range Goal: Health Optimized   Start Date: 08/30/2020  Expected End Date: 08/22/2021  This Visit's Progress: On track  Recent Progress: On track  Priority: High  Note:   Evidence-based guidance:  Use brief intervention, such as 5 A's (Ask, Advise, Assess, Assist, Arrange) to encourage smoking cessation; refer to  smoking cessation program, if ready for more intensive intervention.  Perform or refer to a registered dietitian for a nutrition assessment and nutrition-focused physical exam.   Identify potential micronutrient deficiencies, such as iron, vitamin D and thiamin.  Assess need for potential diet and fluid modification, such as reduced sodium or fluid intake.  Minimize unnecessary dietary restrictions to increase oral intake. Note: Sodium restriction should be individualized to the patient and clinical status.  Facilitate home monitoring of weight.   Notes:     Task: Optimize Health   Due Date: 08/22/2021   Priority: Routine  Outcome: Positive  Responsible User: Verlin Grills, RN  Note:   Care Management Activities:  01/27/21 Updated goal completion date to 02/20/21 Reported swelling for 6 days now of ankles and feet. Detailed education on CHF, CKD afib, Encouraged outreach to CV, Noted pt called CV office to report s/s Progressed to [facility] DM for more education as agreed by pt  01/22/21 denied concerns with CHF worsening symptoms  11/28/20 assessed for wt gain -none, working on nutrition 08/30/20- home monitoring of blood pressure encouraged - home monitoring of weight gain or loss encouraged - optimal nutrition intake promoted    Notes:     Problem: Symptom Exacerbation (Heart Failure)   Priority: Medium  Onset Date: 03/31/2021     Goal: Symptom Exacerbation Prevented or Minimized   Start Date: 03/31/2021  Expected End Date: 08/22/2021  This Visit's Progress: On track  Priority: Medium  Note:   Evidence-based guidance:  Perform or review cognitive and/or health literacy screening.  Assess understanding of adherence and barriers to treatment plan, as well as lifestyle changes; develop strategies to address barriers.  Establish a mutually-agreed-upon early intervention process to communicate with primary care provider when signs/symptoms worsen.  Facilitate timely posthospital discharge or emergency department treatment that includes intensive follow-up via telephone calls, home visit, telehealth monitoring and care at multidisciplinary heart failure clinic.  Adjust frequency and intensity of follow-up based on presentation, number of emergency department visits, hospital admissions and frequency and severity of symptom exacerbation.  Facilitate timely visit, usually within 1 week, with primary care provider following hospital discharge.  Collaborate with clinical pharmacist to address adverse drug reactions, drug interactions, subtherapeutic dosage, patient and family education.  Regularly  screen for presence of depressive symptoms using a validated tool; consider pharmacologic therapy and/or referral for cognitive behavioral therapy when present.  Refer to community-based services, such as a heart failure support group, community Economist or peer support program.  Review immunization status; arrange receipt of needed vaccinations.  Prepare patient for home oxygen use based on signs/symptoms.   Notes:      Task: Identify and Minimize Risk of Heart Failure Exacerbation   Due Date: 03/31/2021  Note:   Care Management Activities:    - healthy lifestyle promoted - rescue (action) plan developed    Notes:       Goals Addressed               This Visit's Progress     (THN) Monitor and Manage My Blood Sugar-Diabetes Type 2 (pt-stated)   On track     Timeframe:  Short-Term Goal Priority:  Medium Start Date:                      11/28/20       Expected End Date:     [id number] Follow up outreach [id number]                 -  check blood sugar at prescribed times - check blood sugar if I feel it is too high or too low - enter blood sugar readings and medication or insulin into daily log   Barriers: Knowledge    Why is this important?   Checking your blood sugar at home helps to keep it from getting very high or very low.  Writing the results in a diary or log helps the doctor know how to care for you.  Your blood sugar log should have the time, date and the results.  Also, write down the amount of insulin or other medicine that you take.  Other information, like what you ate, exercise done and how you were feeling, will also be helpful.     Notes:  01/27/21 continues to report his blood sugars are in the 100s avg 109 01/03/21  reported to be managed at home 100's 12/26/20 cbg value 108 voiced understood about DM sick expectations and home care  Has all Dm medication issues resolved via [place] 11/28/20 checking cbg every Thursday per pt. Last check on 11/21/20 =86, not  checked today 11/28/20 [id number] Patient is monitoring  blood sugars on thursdays. His A1C is 6.1      [facility]) Track and Manage Heart Rate and Rhythm-Atrial Fibrillation (pt-stated)   On track     Timeframe:  Short-Term Goal Priority:  Medium Start Date:         [id number]       Expected End Date:    [id number]                  Follow up date [id number]   - check pulse (heart) rate once a day - take medicine as prescribed    Why is this important?   Atrial fibrillation may have no symptoms. Sometimes the symptoms get worse or happen more often.  It is important to keep track of what your symptoms are and when they happen.  A change in symptoms is important to discuss with your doctor or nurse.  Being active and healthy eating will also help you manage your heart condition.     Notes:  01/27/21 Received education on average heart rate/defib, voiced understanding of elevated heart rates during hospitalization, denies chest pain/discomfort aware of worsening symptoms to report to MD  [id number] Per patient he is feeling good. No symptoms exhibited      [facility]) Track and Manage Symptoms-Heart Failure (pt-stated)   On track     Timeframe:  Short-Term Goal Priority:  Medium Start Date:         08/30/20                    Expected End Date:     [id number]       Follow up date pending [facility] disease management outreach  - develop a rescue plan - know when to call the doctor - track symptoms and what helps feel better or worse   Barriers: Knowledge   Why is this important?   You will be able to handle your symptoms better if you keep track of them.  Making some simple changes to your lifestyle will help.  Eating healthy is one thing you can do to take good care of yourself.    Notes:  01/27/21 with CHF/CKD s/s of edema of ankle and feet for 6 days wt per pt remains unchanged, taking Spironolactone as ordered He weighs and documents daily. Denies shortness of breath, chest pain or dizziness  Pt to call  cardiology for worsening symptoms. Agrees to [facility] disease management services Pt to speak with pcp or cardiology about CKD follow up 01/03/21 daily weights around 147 lbs  12/26/20 12/25/20 confirmed infection of defib site. Saw MD 12/26/20 planned pending procedure 12/27/20 11/28/20  No worsening CHF symptoms [id number] Patient is monitoring swelling in lower extremities      Track and Manage Activity and Exertion-Heart Failure   On track     Timeframe:  Long-Range Goal Priority:  Medium Start Date: [id number]                            Expected End Date:    [id number]                   Follow Up Date [id number]    - follow activity or exercise plan    Why is this important?   Exercising is very important when managing your heart failure.  It will help your heart get stronger.    Notes:  [id number] Patient is mowing the grass for exercises      Track and Manage Fluids and Swelling-Heart Failure   On track     Timeframe:  Long-Range Goal Priority:  Medium Start Date: [id number]                             Expected End Date: [id number]                        Follow Up Date [id number]   - call office if I gain more than 2 pounds in one day or 5 pounds in one week - track weight in diary - use salt in moderation - watch for swelling in feet, ankles and legs every day - weigh myself daily    Why is this important?   It is important to check your weight daily and watch how much salt and liquids you have.  It will help you to manage your heart failure.    Notes:  [id number] Patient is monitoring diet        Plan:  Follow-up: Patient agrees to Care Plan and Follow-up. RN provided education on Heart Failure Exacerbation RN provided education on Heart Failure eating plan RN provided a calendar RN will follow up within the month of November RN sent update assessment to PCP  [place] [facility] [phone]

## 2021-04-02 ENCOUNTER — Encounter: Payer: Self-pay | Admitting: Internal Medicine

## 2021-04-02 NOTE — Progress Notes (Signed)
Remote ICD transmission.   

## 2021-04-08 NOTE — Progress Notes (Signed)
[name]   [phone] [phone]   Clinic Follow up Note   Patient Care Team: Tysinger, Camelia Eng, PA-C as PCP - General (Family Medicine) Minus Breeding, MD as PCP - Cardiology (Cardiology) Deboraha Sprang, MD as PCP - Electrophysiology (Cardiology) Minus Breeding, MD as Consulting Physician (Cardiology) Minus Breeding, MD as Consulting Physician (Cardiology) Hiram Gash, MD as Referring Physician (Cardiology) Sherran Needs, NP as Nurse Practitioner (Nurse Practitioner) Pleasant, Eppie Gibson, RN as [place] [facility] 04/09/2021  CHIEF COMPLAINT: follow up thrombocytopenia   CURRENT THERAPY: Observation  INTERVAL HISTORY: [name] returns for follow-up.  He underwent myocardial biopsy at [facility] last month, it was negative for sarcoid or amyloidosis.  He saw Dr. [name] after the biopsy, he is awaiting to see Dr. Rhae Hammock to see if he can come off steroids and methotrexate which was started for presumed sarcoidosis.  He is clinically doing well, denies any signs of bleeding, or other new complaint.  Mild fatigue is stable.   All other systems were reviewed with the patient and are negative.  MEDICAL HISTORY:  Past Medical History:  Diagnosis Date   ABDOMINAL AORTIC ANEURYSM    CAD    Non obstructive (mild) cath 2007   CHF    EF 15% in 2007, 25% currently   Diabetes mellitus without complication ([place])    GLAUCOMA    HYPERTENSION    TOBACCO ABUSE    Previous    SURGICAL HISTORY: Past Surgical History:  Procedure Laterality Date   BIV ICD INSERTION CRT-D N/A 12/06/2020   Procedure: BIV ICD INSERTION CRT-D;  Surgeon: Vickie Epley, MD;  Location: [place] [facility];  Service: Cardiovascular;  Laterality: N/A;   Clavical Repair     CORNEAL TRANSPLANT     Right eye x 2   EYE SURGERY     Glaucoma   POCKET REVISION/RELOCATION N/A 12/27/2020   Procedure: POCKET REVISION/RELOCATION;  Surgeon: Vickie Epley, MD;   Location: [place] [facility];  Service: Cardiovascular;  Laterality: N/A;   RIGHT HEART CATH N/A 12/05/2020   Procedure: RIGHT HEART CATH;  Surgeon: Jolaine Artist, MD;  Location: [place] [facility];  Service: Cardiovascular;  Laterality: N/A;   RIGHT/LEFT HEART CATH AND CORONARY ANGIOGRAPHY N/A 08/08/2020   Procedure: RIGHT/LEFT HEART CATH AND CORONARY ANGIOGRAPHY;  Surgeon: Jolaine Artist, MD;  Location: [place] [facility];  Service: Cardiovascular;  Laterality: N/A;    I have reviewed the social history and family history with the patient and they are unchanged from previous note.  ALLERGIES:  has No Known Allergies.  MEDICATIONS:  Current Outpatient Medications  Medication Sig Dispense Refill   amiodarone (PACERONE) 200 MG tablet Take 200 mg by mouth as directed. Take 1 tablet by mouth Monday-Friday     aspirin 81 MG EC tablet Take 1 tablet (81 mg total) by mouth daily. 30 tablet 11   atorvastatin (LIPITOR) 40 MG tablet Take 40 mg by mouth daily.     cholecalciferol (VITAMIN D) 1000 UNITS tablet Take 1,000 Units by mouth 2 (two) times daily.      empagliflozin (JARDIANCE) 25 MG TABS tablet Take 25 mg by mouth daily.     lidocaine (XYLOCAINE) 2 % solution Use as directed 10 mLs in the mouth or throat every 4 (four) hours as needed for mouth pain. Swish, gargle, and spit 200 mL 0   methotrexate (RHEUMATREX) 2.5 MG tablet Take 4 tablets (10 mg total) by mouth  once a week. 24 tablet 3   metoprolol succinate (TOPROL-XL) 50 MG 24 hr tablet Take 50 mg by mouth daily.     nystatin (MYCOSTATIN) 100000 UNIT/ML suspension Take 2 mLs (200,000 Units total) by mouth 4 (four) times daily. 74ml to each cheek QID x 7 days 100 mL 0   potassium chloride 20 MEQ TBCR Take 1 tablet with Lasix. 90 tablet 3   predniSONE (DELTASONE) 10 MG tablet Take 10 mg by mouth in the morning, at noon, and at bedtime.     spironolactone (ALDACTONE) 25 MG tablet Take 1 tablet (25 mg total) by mouth daily. 90 tablet 3    sulfamethoxazole-trimethoprim (BACTRIM DS) 800-160 MG tablet Take 1 tablet by mouth 3 (three) times a week. 12 tablet 3   No current facility-administered medications for this visit.    PHYSICAL EXAMINATION: ECOG PERFORMANCE STATUS: 1 - Symptomatic but completely ambulatory  Vitals:   04/09/21 0952  BP: 105/82  Pulse: 73  Resp: 18  Temp: (!) 96.3 F (35.7 C)  SpO2: 98%   Filed Weights   04/09/21 0952  Weight: 152 lb 11.2 oz (69.3 kg)    GENERAL:alert, no distress and comfortable SKIN: skin color, texture, turgor are normal, no rashes or significant lesions EYES: normal, Conjunctiva are pink and non-injected, sclera clear OROPHARYNX:no exudate, no erythema and lips, buccal mucosa, and tongue normal  NECK: supple, thyroid normal size, non-tender, without nodularity LYMPH:  no palpable lymphadenopathy in the cervical, axillary or inguinal LUNGS: clear to auscultation and percussion with normal breathing effort HEART: regular rate & rhythm and no murmurs and no lower extremity edema ABDOMEN:abdomen soft, non-tender and normal bowel sounds Musculoskeletal:no cyanosis of digits and no clubbing  NEURO: alert & oriented x 3 with fluent speech, no focal motor/sensory deficits  LABORATORY DATA:  I have reviewed the data as listed CBC Latest Ref Rng & Units 04/09/2021 02/18/2021 02/06/2021  WBC 4.0 - 10.5 K/uL 9.7 9.8 8.3  Hemoglobin 13.0 - 17.0 g/dL 14.6 14.7 14.8  Hematocrit 39.0 - 52.0 % 43.0 43.5 43.3  Platelets 150 - 400 K/uL 96(L) 99(L) 75(LL)     CMP Latest Ref Rng & Units 04/09/2021 01/08/2021 12/13/2020  Glucose 70 - 99 mg/dL 92 117(H) 91  BUN 8 - 23 mg/dL 40(H) 48(H) 23  Creatinine 0.61 - 1.24 mg/dL 1.78(H) 1.64(H) 1.56(H)  Sodium 135 - 145 mmol/L 136 134 136  Potassium 3.5 - 5.1 mmol/L 4.9 5.1 4.2  Chloride 98 - 111 mmol/L 110 105 108  CO2 22 - 32 mmol/L 18(L) 17(L) 23  Calcium 8.9 - 10.3 mg/dL 9.4 9.9 8.8(L)  Total Protein 6.5 - 8.1 g/dL 6.2(L) 6.2 -  Total  Bilirubin 0.3 - 1.2 mg/dL 0.9 0.4 -  Alkaline Phos 38 - 126 U/L 59 88 -  AST 15 - 41 U/L 24 22 -  ALT 0 - 44 U/L 45(H) 34 -      RADIOGRAPHIC STUDIES: I have personally reviewed the radiological images as listed and agreed with the findings in the report. No results found.   ASSESSMENT & PLAN:  Denney Shein is a 75 y.o. African American male with a history of nonischemic cardiomyopathy with EF 25%, AAA, CKD, Glaucoma, HTN, Tobacco [place]  -his recent   1 chronic mild thrombocytopenia  -His medical records showed normal CBC in 2008 and mild thrombocytopenia with platelet around 100K since 06/2020, no lab results available between 2008 and 2021.  His WBC and hemoglobin has been normal. -He started methotrexate  and prednisone in the spring 2022 for presumed sarcoidosis, no change of his mild thrombocytopenia since then. -I reviewed his recent myocardial biopsy which was negative for sarcoidosis or amyloidosis, it did not show scar tissues. -Given the mild and stable thrombocytopenia, no anemia or leukopenia, I will hold bone marrow biopsy for now -Continue lab monitoring  2. MGUS -Low-level M protein (0.29) was detected during his work-up for cardiomyopathy -No anemia, or other concerning for multiple myeloma -Continue follow-up -May consider bone marrow biopsy if he develops concerning signs, or significant increase of M protein  3. HTN, atrial fibrillation, nonischemic cardiomyopathy with EF 25% -Follow-up with cardiology -Patient is wondering if he can come off prednisone and methotrexate, I will send a message to Dr. Haroldine Laws   Plan -Labs every 4 months -Follow-up in 8 months   No orders of the defined types were placed in this encounter.  All questions were answered. The patient knows to call the clinic with any problems, questions or concerns. No barriers to learning was detected. I spent 20 minutes counseling the patient face to face. The total time spent in the appointment  was 25 minutes and more than 50% was on counseling and review of test results     Truitt Merle, MD 04/09/21

## 2021-04-09 ENCOUNTER — Other Ambulatory Visit: Payer: Self-pay

## 2021-04-09 ENCOUNTER — Inpatient Hospital Stay: Payer: Medicare Other | Attending: Hematology | Admitting: Hematology

## 2021-04-09 ENCOUNTER — Inpatient Hospital Stay: Payer: Medicare Other

## 2021-04-09 VITALS — BP 105/82 | HR 73 | Temp 96.3°F | Resp 18 | Wt 152.7 lb

## 2021-04-09 DIAGNOSIS — I4891 Unspecified atrial fibrillation: Secondary | ICD-10-CM | POA: Insufficient documentation

## 2021-04-09 DIAGNOSIS — F17210 Nicotine dependence, cigarettes, uncomplicated: Secondary | ICD-10-CM | POA: Insufficient documentation

## 2021-04-09 DIAGNOSIS — R5383 Other fatigue: Secondary | ICD-10-CM | POA: Diagnosis not present

## 2021-04-09 DIAGNOSIS — I2510 Atherosclerotic heart disease of native coronary artery without angina pectoris: Secondary | ICD-10-CM | POA: Diagnosis not present

## 2021-04-09 DIAGNOSIS — N189 Chronic kidney disease, unspecified: Secondary | ICD-10-CM | POA: Insufficient documentation

## 2021-04-09 DIAGNOSIS — D696 Thrombocytopenia, unspecified: Secondary | ICD-10-CM | POA: Diagnosis present

## 2021-04-09 DIAGNOSIS — D472 Monoclonal gammopathy: Secondary | ICD-10-CM | POA: Insufficient documentation

## 2021-04-09 DIAGNOSIS — I10 Essential (primary) hypertension: Secondary | ICD-10-CM | POA: Diagnosis not present

## 2021-04-09 DIAGNOSIS — I129 Hypertensive chronic kidney disease with stage 1 through stage 4 chronic kidney disease, or unspecified chronic kidney disease: Secondary | ICD-10-CM | POA: Diagnosis not present

## 2021-04-09 DIAGNOSIS — Z79899 Other long term (current) drug therapy: Secondary | ICD-10-CM | POA: Diagnosis not present

## 2021-04-09 DIAGNOSIS — I714 Abdominal aortic aneurysm, without rupture: Secondary | ICD-10-CM | POA: Diagnosis not present

## 2021-04-09 DIAGNOSIS — Z7982 Long term (current) use of aspirin: Secondary | ICD-10-CM | POA: Diagnosis not present

## 2021-04-09 DIAGNOSIS — I509 Heart failure, unspecified: Secondary | ICD-10-CM | POA: Insufficient documentation

## 2021-04-09 LAB — COMPREHENSIVE METABOLIC PANEL
ALT: 45 U/L — ABNORMAL HIGH (ref 0–44)
AST: 24 U/L (ref 15–41)
Albumin: 3.2 g/dL — ABNORMAL LOW (ref 3.5–5.0)
Alkaline Phosphatase: 59 U/L (ref 38–126)
Anion gap: 8 (ref 5–15)
BUN: 40 mg/dL — ABNORMAL HIGH (ref 8–23)
CO2: 18 mmol/L — ABNORMAL LOW (ref 22–32)
Calcium: 9.4 mg/dL (ref 8.9–10.3)
Chloride: 110 mmol/L (ref 98–111)
Creatinine, Ser: 1.78 mg/dL — ABNORMAL HIGH (ref 0.61–1.24)
GFR, Estimated: 40 mL/min — ABNORMAL LOW (ref >60)
Glucose, Bld: 92 mg/dL (ref 70–99)
Potassium: 4.9 mmol/L (ref 3.5–5.1)
Sodium: 136 mmol/L (ref 135–145)
Total Bilirubin: 0.9 mg/dL (ref 0.3–1.2)
Total Protein: 6.2 g/dL — ABNORMAL LOW (ref 6.5–8.1)

## 2021-04-09 LAB — CBC WITH DIFFERENTIAL/PLATELET
Abs Immature Granulocytes: 0.15 10*3/uL — ABNORMAL HIGH (ref 0.00–0.07)
Basophils Absolute: 0 10*3/uL (ref 0.0–0.1)
Basophils Relative: 0 %
Eosinophils Absolute: 0.1 10*3/uL (ref 0.0–0.5)
Eosinophils Relative: 1 %
HCT: 43 % (ref 39.0–52.0)
Hemoglobin: 14.6 g/dL (ref 13.0–17.0)
Immature Granulocytes: 2 %
Lymphocytes Relative: 13 %
Lymphs Abs: 1.3 10*3/uL (ref 0.7–4.0)
MCH: 37.3 pg — ABNORMAL HIGH (ref 26.0–34.0)
MCHC: 34 g/dL (ref 30.0–36.0)
MCV: 110 fL — ABNORMAL HIGH (ref 80.0–100.0)
Monocytes Absolute: 0.4 10*3/uL (ref 0.1–1.0)
Monocytes Relative: 4 %
Neutro Abs: 7.8 10*3/uL — ABNORMAL HIGH (ref 1.7–7.7)
Neutrophils Relative %: 80 %
Platelets: 96 10*3/uL — ABNORMAL LOW (ref 150–400)
RBC: 3.91 MIL/uL — ABNORMAL LOW (ref 4.22–5.81)
RDW: 19.7 % — ABNORMAL HIGH (ref 11.5–15.5)
WBC: 9.7 10*3/uL (ref 4.0–10.5)
nRBC: 0.5 % — ABNORMAL HIGH (ref 0.0–0.2)

## 2021-04-24 ENCOUNTER — Other Ambulatory Visit: Payer: Self-pay | Admitting: Nephrology

## 2021-04-24 DIAGNOSIS — N1831 Chronic kidney disease, stage 3a: Secondary | ICD-10-CM

## 2021-05-06 ENCOUNTER — Other Ambulatory Visit (HOSPITAL_COMMUNITY): Payer: Self-pay

## 2021-05-06 ENCOUNTER — Ambulatory Visit (HOSPITAL_COMMUNITY)
Admission: RE | Admit: 2021-05-06 | Discharge: 2021-05-06 | Disposition: A | Discharge status: Home / Self Care | Payer: Medicare Other | Source: Ambulatory Visit | Attending: Internal Medicine | Admitting: Internal Medicine

## 2021-05-06 ENCOUNTER — Other Ambulatory Visit: Payer: Self-pay

## 2021-05-06 ENCOUNTER — Encounter (HOSPITAL_COMMUNITY): Payer: Self-pay | Admitting: Internal Medicine

## 2021-05-06 VITALS — BP 124/80 | HR 73 | Wt 155.8 lb

## 2021-05-06 DIAGNOSIS — Z7952 Long term (current) use of systemic steroids: Secondary | ICD-10-CM | POA: Insufficient documentation

## 2021-05-06 DIAGNOSIS — I130 Hypertensive heart and chronic kidney disease with heart failure and stage 1 through stage 4 chronic kidney disease, or unspecified chronic kidney disease: Secondary | ICD-10-CM | POA: Insufficient documentation

## 2021-05-06 DIAGNOSIS — Z7982 Long term (current) use of aspirin: Secondary | ICD-10-CM | POA: Diagnosis not present

## 2021-05-06 DIAGNOSIS — Z95810 Presence of automatic (implantable) cardiac defibrillator: Secondary | ICD-10-CM | POA: Diagnosis not present

## 2021-05-06 DIAGNOSIS — I2510 Atherosclerotic heart disease of native coronary artery without angina pectoris: Secondary | ICD-10-CM | POA: Diagnosis not present

## 2021-05-06 DIAGNOSIS — I5042 Chronic combined systolic (congestive) and diastolic (congestive) heart failure: Secondary | ICD-10-CM | POA: Insufficient documentation

## 2021-05-06 DIAGNOSIS — Z87891 Personal history of nicotine dependence: Secondary | ICD-10-CM | POA: Insufficient documentation

## 2021-05-06 DIAGNOSIS — Z79899 Other long term (current) drug therapy: Secondary | ICD-10-CM | POA: Diagnosis not present

## 2021-05-06 DIAGNOSIS — D8685 Sarcoid myocarditis: Secondary | ICD-10-CM | POA: Diagnosis not present

## 2021-05-06 DIAGNOSIS — D696 Thrombocytopenia, unspecified: Secondary | ICD-10-CM | POA: Diagnosis not present

## 2021-05-06 DIAGNOSIS — I493 Ventricular premature depolarization: Secondary | ICD-10-CM

## 2021-05-06 DIAGNOSIS — I428 Other cardiomyopathies: Secondary | ICD-10-CM | POA: Insufficient documentation

## 2021-05-06 DIAGNOSIS — I5082 Biventricular heart failure: Secondary | ICD-10-CM | POA: Insufficient documentation

## 2021-05-06 DIAGNOSIS — N1831 Chronic kidney disease, stage 3a: Secondary | ICD-10-CM

## 2021-05-06 DIAGNOSIS — I472 Ventricular tachycardia: Secondary | ICD-10-CM | POA: Insufficient documentation

## 2021-05-06 DIAGNOSIS — I4729 Other ventricular tachycardia: Secondary | ICD-10-CM

## 2021-05-06 DIAGNOSIS — Z7984 Long term (current) use of oral hypoglycemic drugs: Secondary | ICD-10-CM | POA: Diagnosis not present

## 2021-05-06 DIAGNOSIS — I480 Paroxysmal atrial fibrillation: Secondary | ICD-10-CM

## 2021-05-06 DIAGNOSIS — I739 Peripheral vascular disease, unspecified: Secondary | ICD-10-CM

## 2021-05-06 DIAGNOSIS — I5022 Chronic systolic (congestive) heart failure: Secondary | ICD-10-CM

## 2021-05-06 DIAGNOSIS — I4891 Unspecified atrial fibrillation: Secondary | ICD-10-CM | POA: Insufficient documentation

## 2021-05-06 LAB — BASIC METABOLIC PANEL
Anion gap: 7 (ref 5–15)
BUN: 24 mg/dL — ABNORMAL HIGH (ref 8–23)
CO2: 22 mmol/L (ref 22–32)
Calcium: 9.6 mg/dL (ref 8.9–10.3)
Chloride: 106 mmol/L (ref 98–111)
Creatinine, Ser: 1.54 mg/dL — ABNORMAL HIGH (ref 0.61–1.24)
GFR, Estimated: 47 mL/min — ABNORMAL LOW (ref >60)
Glucose, Bld: 112 mg/dL — ABNORMAL HIGH (ref 70–99)
Potassium: 5.4 mmol/L — ABNORMAL HIGH (ref 3.5–5.1)
Sodium: 135 mmol/L (ref 135–145)

## 2021-05-06 LAB — CBC
HCT: 46.5 % (ref 39.0–52.0)
Hemoglobin: 15.4 g/dL (ref 13.0–17.0)
MCH: 38.3 pg — ABNORMAL HIGH (ref 26.0–34.0)
MCHC: 33.1 g/dL (ref 30.0–36.0)
MCV: 115.7 fL — ABNORMAL HIGH (ref 80.0–100.0)
Platelets: 90 10*3/uL — ABNORMAL LOW (ref 150–400)
RBC: 4.02 MIL/uL — ABNORMAL LOW (ref 4.22–5.81)
RDW: 18.2 % — ABNORMAL HIGH (ref 11.5–15.5)
WBC: 9 10*3/uL (ref 4.0–10.5)
nRBC: 0.8 % — ABNORMAL HIGH (ref 0.0–0.2)

## 2021-05-06 LAB — BRAIN NATRIURETIC PEPTIDE: B Natriuretic Peptide: 484.3 pg/mL — ABNORMAL HIGH (ref 0.0–100.0)

## 2021-05-06 MED ORDER — METHOTREXATE 2.5 MG PO TABS: ORAL_TABLET | ORAL | 0 refills | Status: AC

## 2021-05-06 MED ORDER — ENTRESTO 24-26 MG PO TABS: 1.0000 | ORAL_TABLET | Freq: Two times a day (BID) | ORAL | 0 refills | Status: AC

## 2021-05-06 MED ORDER — PREDNISONE 10 MG PO TABS: 15.0000 mg | ORAL_TABLET | Freq: Every day | ORAL | 3 refills | Status: AC

## 2021-05-06 NOTE — Progress Notes (Signed)
Medication Samples have been provided to the patient.  Drug name: [name]       Strength: 24/'26mg'$         Qty: 2  LOTGE:[id number]  Exp.Date: 7/24  Dosing instructions: take 1 tab Twice daily   The patient has been instructed regarding the correct time, dose, and frequency of taking this medication, including desired effects and most common side effects.   [name] 12:17 PM 05/06/2021

## 2021-05-06 NOTE — Patient Instructions (Signed)
Change Prednisone to 15 mg (1 & 1/2 tabs) Daily  Increase Methotrexate:   Monday 9/19= take 5 tablets (12.5 mg)  Monday 9/26= take 5 tablets (12.5 mg)  Monday 10/3= take 6 tablets (15 mg)  Monday 10/10= take 6 tablets (15 mg)  Start Entresto 24/26 mg Twice daily   Labs done today, your results will be available in MyChart, we will contact you for abnormal readings.  Your physician recommends that you return for lab work in: 2 weeks  Your physician recommends that you schedule a follow-up appointment in: 1 month  If you have any questions or concerns before your next appointment please send [place] a message through [place] or call our office at [phone].    TO LEAVE A MESSAGE FOR THE NURSE SELECT OPTION 2, PLEASE LEAVE A MESSAGE INCLUDING: YOUR NAME DATE OF BIRTH CALL BACK NUMBER REASON FOR CALL**this is important as we prioritize the call backs  YOU WILL RECEIVE A CALL BACK THE SAME DAY AS LONG AS YOU CALL BEFORE 4:00 PM  At the [facility], you and your health needs are our priority. As part of our continuing mission to provide you with exceptional heart care, we have created designated Provider Care Teams. These Care Teams include your primary Cardiologist (physician) and Advanced Practice Providers (APPs- Physician Assistants and Nurse Practitioners) who all work together to provide you with the care you need, when you need it.   You may see any of the following providers on your designated Care Team at your next follow up: Dr Glori Bickers Dr Loralie Champagne Dr Patrice Paradise, NP Lyda Jester, [place] Ginnie Smart Audry Riles, PharmD   Please be sure to bring in all your medications bottles to every appointment.

## 2021-05-06 NOTE — Progress Notes (Signed)
ADVANCED HF CLINIC NOTE   PCP: Robert Hurl, PA-C Primary HF Cardiologist: Dr [name]   HPI: [name] is a 75 y/o with history of  chronic systolic heart failure w/ biventricular dysfunction. Notes indicate he has a NICM.  Echos dating back to 2008 have shown chronic systolic heart failure, LVEF has been as low as 20%. Most recent echo 11/21 showed EF 20-25%, diffuse HK, G2DD, w/ moderately reduced RV systolic function and moderate MR.   Other PMH includes MGUS and thrombocytopenia followed by heme/onc, HTN, Stage III CKD (baseline SCr ~1.5), PAD s/p bifem bypass and aorto-iliac aneursym repair, tobacco use, and abnormal chest CT 11/21 showing bilateral pulmonary nodules and mild mediastinal and hilar adenopathy. Quit smoking 2021.    Admitted 11/21 for a/c CHF and diuresed w/ IV Lasix.   Admitted in 12/21 with  ADHF. HF meds adjusted to GDMT. Had cath that showed moderate non obstructive CAD. EF remained < 35%.   Admitted 4/22 for tikosyn load for AF but QT prolonged. Started on amio.   RHC 4/22  RA = 1 RV = 31/2 PA = 30/15 (20) PCW = 13 Fick cardiac output/index = 3.9/2.1 PVR = 1.9 WU Ao sat = 98% PA sat =62%, 64%  Underwent CRT-D insertion. 12/05/20. Found to have device pocket hematoma and on 12/27/20 underwent hematoma evacuation and pocket revision. Started on doxy 100 bid.   Re[facility] at [facility] 12/27/20 EF 13% diffuse uptake despite MTX and prednisone. Saw Dr. Weyman Best who fely he may need cardiac bx to confirm dx.    Last seen for f/u in May. Volume status appeared stable.   Had myocardial biopsy at [facility] in July which did not show definitive evidence of sarcoid. There was interstitial and endocardial fibrosis.  Could not rule out remote/healed sarcoid or myocarditis with pattern of fibrosis. No evidence of amyloid.   Saw Dr. Caryl Best in July. Amiodarone decreased d/t no recurrence of atrial and ventricular arrhythmias.  He is here today for HF f/u. Has multiple  medication questions and is wondering if he needs to stay on prednisone, MTX and bactrim. No CP or shortness of breath. Mows his lawn with self-propelled mower. No orthopnea, PND or edema. Has gained about 7-8 lb but does not feel it is fluid. Had been prescribed prn furosemide a few months ago but not needing to use it.    Cardiac Testing  Biopsy 077/22  -Interstitial and endocardial fibrosis that extends to the endocardium. See Comment.   -Focal subacute healing.   -No definitive histologic evidence of sarcoid, amyloid, active myocarditis, giant cells, extensive adipocytic infiltration, iron deposition, myocyte vacuolization or acute infarction.   Comment: With this pattern of fibrosis, a remote/healed sarcoid or myocarditis cannot be completely excluded. Trichrome stain highlights fibrosis. [place]-Red stain is negative for amyloid deposition.   10/21/20 PET/CT-  Impression:   1. Findings are suggestive of inflammatory process involving predominantly right atrium, 5/17 segments of the left ventricle and   to a lesser degree left atrium.   2. Coronary artery calcifications and aortic calcifications.   3. No evidence of metabolically active sarcoid outside of the heart.    RHC/LHC 07/2020 -moderate non-obstructive CAD  Echo 11/21 LVEF 20-25%, G2DD, diffuse HK, moderately reduced RV  CMRI 07/2020  - LV EF 24% These findings are consistent with non-ischemic cardiomyopathy and are highly suspicious for diffuse cardiac sarcoidosis. ECV is very elevated at 45% (amyloid range, however no other findings suggestive of cardiac amyloidosis), however  most probably represent diffuse scarring.    ROS: All systems negative except as listed in HPI, PMH and Problem List.  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    Comment: Veteran  Tobacco Use   Smoking status: Some Days    Packs/day: 2.00    Years: 35.00    Pack years: 70.00    Types: Cigarettes    Last attempt to quit: 03/08/2008    Years since quitting: 13.1   Smokeless tobacco: Former   Tobacco comments:    Has not smoked in 2 months   Vaping Use   Vaping Use: Never used  Substance and Sexual Activity   Alcohol use: No    Comment: used to deink alcohol moderately, stopped in 2019   Drug use: Yes    Types: Marijuana    Comment: occasional   Sexual activity: Not on file  Other Topics Concern   Not on file  Social History Narrative   single male who lives alone but with the support of his daughter [name]. He is doing well with his care needs    He used to work in [employer] part time, stopped in Dec 2021   high school graduate with 3 years of college   5 children    Male pit bull in home- [place] relative is daughter [name], and she lives here in [place].  He has not done advanced directives   Social Determinants of Health   Financial Resource Strain: Medium Risk   Difficulty of Paying Living Expenses: Somewhat hard  Food Insecurity: No Food Insecurity   Worried About Charity fundraiser in the Last Year: Never true   Ran Out of Food in the Last Year: Never true  Transportation Needs: No Transportation Needs   Lack of Transportation (Medical): No   Lack of Transportation (Non-Medical): No  Physical Activity: Not on file  Stress: No Stress Concern Present   Feeling of Stress : Only a little  Social Connections: Moderately Integrated   Frequency of Communication with Friends and Family: Twice a week   Frequency of Social Gatherings with Friends and Family: Twice a week   Attends Religious Services: 1 to 4 times per year   Active Member of [employer] or Organizations: Yes   Attends Archivist Meetings:  1 to 4 times per year   Marital Status: Divorced  Human resources officer Violence: Not At Risk   Fear of Current or Ex-Partner: No   Emotionally Abused: No   Physically Abused: No   Sexually Abused: No    FH:  Family History  Problem Relation Age of Onset   Diabetes Mother    Diabetes Sister    Diabetes Brother     Past Medical History:  Diagnosis Date   ABDOMINAL AORTIC ANEURYSM    CAD    Non obstructive (mild) cath 2007   CHF    EF 15% in 2007, 25% currently   Diabetes mellitus without complication ([place])    GLAUCOMA    HYPERTENSION    TOBACCO ABUSE    Previous    Current Outpatient Medications  Medication Sig Dispense Refill   amiodarone (PACERONE)  200 MG tablet Take 200 mg by mouth as directed. Take 1 tablet by mouth Monday-Friday     aspirin 81 MG EC tablet Take 1 tablet (81 mg total) by mouth daily. 30 tablet 11   atorvastatin (LIPITOR) 40 MG tablet Take 40 mg by mouth daily.     cholecalciferol (VITAMIN D) 1000 UNITS tablet Take 1,000 Units by mouth 2 (two) times daily.      empagliflozin (JARDIANCE) 25 MG TABS tablet Take 25 mg by mouth daily.     lidocaine (XYLOCAINE) 2 % solution Use as directed 10 mLs in the mouth or throat every 4 (four) hours as needed for mouth pain. Swish, gargle, and spit 200 mL 0   methotrexate (RHEUMATREX) 2.5 MG tablet Take 4 tablets (10 mg total) by mouth once a week. 24 tablet 3   metoprolol succinate (TOPROL-XL) 50 MG 24 hr tablet Take 50 mg by mouth daily.     nystatin (MYCOSTATIN) 100000 UNIT/ML suspension Take 2 mLs (200,000 Units total) by mouth 4 (four) times daily. 95m to each cheek QID x 7 days 100 mL 0   potassium chloride 20 MEQ TBCR Take 1 tablet with Lasix. 90 tablet 3   predniSONE (DELTASONE) 10 MG tablet Take 10 mg by mouth in the morning, at noon, and at bedtime.     spironolactone (ALDACTONE) 25 MG tablet Take 1 tablet (25 mg total) by mouth daily. 90 tablet 3   sulfamethoxazole-trimethoprim (BACTRIM DS) 800-160 MG tablet  Take 1 tablet by mouth 3 (three) times a week. 12 tablet 3   No current facility-administered medications for this encounter.    Vitals:   05/06/21 1024  BP: 124/80  Pulse: 73  SpO2: 98%  Weight: 70.7 kg (155 lb 12.8 oz)   Wt Readings from Last 3 Encounters:  05/06/21 70.7 kg (155 lb 12.8 oz)  04/09/21 69.3 kg (152 lb 11.2 oz)  03/12/21 69.4 kg (153 lb)    PHYSICAL EXAM: General:  Well appearing elderly AAM. No resp difficulty HEENT: normal Neck: supple. no JVD. Carotids 2+ bilat; no bruits. No lymphadenopathy or thryomegaly appreciated. Cor: PMI nondisplaced. Regular rate & rhythm. No rubs, gallops or murmurs. Lungs: clear Abdomen: soft, nontender, nondistended. No hepatosplenomegaly. No bruits or masses. Good bowel sounds. Extremities: no cyanosis, clubbing, rash, edema Neuro: alert & orientedx3, cranial nerves grossly intact. moves all 4 extremities w/o difficulty. Affect pleasant   ASSESSMENT & PLAN:  1. Chronic Combined Systolic and Biventricular Heart Failure in setting of cardiac sarcoidosis - dates back to at least 2007, EF chronically in 20-35% range. - Most recent echo 11/21 LVEF 20-25%, G2DD, diffuse HK, moderately reduced RV - cMRI suggestive of sarcoid. EF 24% - s/p ICD 4/22, pocket hematoma and blood extravasation s/p revision 05/22 [facility] no-obs CAD, elevated filling pressures and CI 1.9  - [facility] EF 13% diffuse FDG uptake despite MTX and prednisone - [facility] and endocardial fibrosis. No definitive evidence of sarcoid, however with pattern of fibrosis cannot r/o remote/healed sarcoid or myocarditis - NYHA II. Volume stable. Not requiring loop diuretic. - Entresto stopped during admit in April d/t hypotension. BP stable. Restart at 24/26 mg BID - Continue Toprol XL 50 mg daily  - Continue Spiro 25 mg daily  - Continue Farxiga 10 mg daily - Decrease prednisone to 15 mg daily. Will continue to  taper at subsequent f/u.  - Increase methotrexate to 12.5 mg X 2  weeks, then 15 mg X 2 weeks.  - Labs today. May stop K supplement if potassium is elevated.     2. CAD  - LHC 4/22 w/ mod non-obs CAD - no s/s angina  - Continue ASA + ? blocker - Intolerant atorvastatin due to blurred vision.   - Consider referral to [facility] for repatha. Held off today d/t multiple med changes and he is concerned about multiple recent medical appointments   3. Stage IIIa CKD - Baseline SCr ~1.4-1.5 - 1.78 in August - BMET today   4. PVCs/NSVT - cath w/ non obs CAD  - Continue Toprol 50 mg daily - On amio per Dr. Caryl Best   5. PAD - s/p bifem bypass and aorto-iliac aneursym repair at [facility]  - Denies claudication - continue ASA  - Intolerant statin.   6. Atrial fibrillation - Follows with EP/Dr. Caryl Best - On amio 200 mg 5 days a week - Has been off anticoagulation. Discussed restarting Eliquis today. He is hesitant. Risks/benefits discussed. He will think about it, can revisit at next f/u.  7. Thrombocytopenia - Followed by Heme/Onc - Monitor closely if restart anticoagulation  F/u: BMET in 2 weeks, clinic f/u in 1 month   [facility], LINDSAY N MD  10:40 AM  Patient seen and examined with the above-signed Advanced Practice Provider and/or Housestaff. I personally reviewed laboratory data, imaging studies and relevant notes. I independently examined the patient and formulated the important aspects of the plan. I have edited the note to reflect any of my changes or salient points. I have personally discussed the plan with the patient and/or family.  Overall improved NYHA II. Had endomyocardial biopsy at [facility] which was unrevealing. VT has been stable on MTX and prednisone but he says prednisoe is "blowing him up" and he wants to cut back. ICD has not fired. No edema, orthopnea or PND. Complaint with meds.   ICD interrogated personally. No VT.  General:  Well appearing. No resp  difficulty HEENT: normal Neck: supple. no JVD. Carotids 2+ bilat; no bruits. No lymphadenopathy or thryomegaly appreciated. Cor: PMI nondisplaced. Regular rate & rhythm. No rubs, gallops or murmurs. Lungs: clear Abdomen: soft, nontender, nondistended. No hepatosplenomegaly. No bruits or masses. Good bowel sounds. Extremities: no cyanosis, clubbing, rash, edema Neuro: alert & orientedx3, cranial nerves grossly intact. moves all 4 extremities w/o difficulty. Affect pleasant  Much improved. Will adjust MTX and prednisone as above. Continue current GDMT. Due for labs and repeat echo. We discussed role of AC in preventing stroke in setting of PAF but he prefers to stay off AC.   Glori Bickers, MD  11:27 PM

## 2021-05-09 ENCOUNTER — Telehealth: Payer: Self-pay | Admitting: Internal Medicine

## 2021-05-09 NOTE — Telephone Encounter (Signed)
Left message for pt to call back  Needs diabetic eye exam and last colonoscopy report

## 2021-05-20 ENCOUNTER — Other Ambulatory Visit: Payer: Self-pay

## 2021-05-20 ENCOUNTER — Ambulatory Visit
Admission: RE | Admit: 2021-05-20 | Discharge: 2021-05-20 | Disposition: A | Discharge status: Home / Self Care | Payer: Medicare Other | Source: Ambulatory Visit | Attending: Nephrology | Admitting: Nephrology

## 2021-05-20 ENCOUNTER — Ambulatory Visit (HOSPITAL_COMMUNITY)
Admission: RE | Admit: 2021-05-20 | Discharge: 2021-05-20 | Disposition: A | Discharge status: Home / Self Care | Payer: Medicare Other | Source: Ambulatory Visit | Attending: Internal Medicine | Admitting: Internal Medicine

## 2021-05-20 DIAGNOSIS — I5042 Chronic combined systolic (congestive) and diastolic (congestive) heart failure: Secondary | ICD-10-CM | POA: Diagnosis not present

## 2021-05-20 DIAGNOSIS — N1831 Chronic kidney disease, stage 3a: Secondary | ICD-10-CM

## 2021-05-20 LAB — BASIC METABOLIC PANEL
Anion gap: 6 (ref 5–15)
BUN: 30 mg/dL — ABNORMAL HIGH (ref 8–23)
CO2: 20 mmol/L — ABNORMAL LOW (ref 22–32)
Calcium: 8.8 mg/dL — ABNORMAL LOW (ref 8.9–10.3)
Chloride: 111 mmol/L (ref 98–111)
Creatinine, Ser: 1.81 mg/dL — ABNORMAL HIGH (ref 0.61–1.24)
GFR, Estimated: 39 mL/min — ABNORMAL LOW (ref >60)
Glucose, Bld: 94 mg/dL (ref 70–99)
Potassium: 4.7 mmol/L (ref 3.5–5.1)
Sodium: 137 mmol/L (ref 135–145)

## 2021-06-02 ENCOUNTER — Encounter (HOSPITAL_COMMUNITY): Payer: Self-pay | Admitting: Pulmonary Disease

## 2021-06-02 ENCOUNTER — Emergency Department (HOSPITAL_COMMUNITY): Payer: Medicare Other

## 2021-06-02 ENCOUNTER — Telehealth: Payer: Self-pay | Admitting: Medical

## 2021-06-02 ENCOUNTER — Emergency Department (HOSPITAL_COMMUNITY)
Admission: EM | Admit: 2021-06-02 | Discharge: 2021-06-24 | Disposition: E | Payer: Medicare Other | Attending: Emergency Medicine | Admitting: Emergency Medicine

## 2021-06-02 DIAGNOSIS — I2510 Atherosclerotic heart disease of native coronary artery without angina pectoris: Secondary | ICD-10-CM | POA: Insufficient documentation

## 2021-06-02 DIAGNOSIS — I469 Cardiac arrest, cause unspecified: Secondary | ICD-10-CM | POA: Diagnosis not present

## 2021-06-02 DIAGNOSIS — I130 Hypertensive heart and chronic kidney disease with heart failure and stage 1 through stage 4 chronic kidney disease, or unspecified chronic kidney disease: Secondary | ICD-10-CM | POA: Diagnosis not present

## 2021-06-02 DIAGNOSIS — I5022 Chronic systolic (congestive) heart failure: Secondary | ICD-10-CM | POA: Diagnosis not present

## 2021-06-02 DIAGNOSIS — Z20822 Contact with and (suspected) exposure to covid-19: Secondary | ICD-10-CM | POA: Diagnosis not present

## 2021-06-02 DIAGNOSIS — I499 Cardiac arrhythmia, unspecified: Secondary | ICD-10-CM | POA: Diagnosis not present

## 2021-06-02 DIAGNOSIS — N1831 Chronic kidney disease, stage 3a: Secondary | ICD-10-CM | POA: Diagnosis not present

## 2021-06-02 DIAGNOSIS — E1122 Type 2 diabetes mellitus with diabetic chronic kidney disease: Secondary | ICD-10-CM | POA: Insufficient documentation

## 2021-06-02 DIAGNOSIS — R0602 Shortness of breath: Secondary | ICD-10-CM | POA: Diagnosis present

## 2021-06-02 DIAGNOSIS — F17210 Nicotine dependence, cigarettes, uncomplicated: Secondary | ICD-10-CM | POA: Insufficient documentation

## 2021-06-02 HISTORY — DX: Monoclonal gammopathy: D47.2

## 2021-06-02 HISTORY — DX: Paroxysmal atrial fibrillation: I48.0

## 2021-06-02 HISTORY — DX: Other nonspecific abnormal finding of lung field: R91.8

## 2021-06-02 HISTORY — DX: Chronic kidney disease, stage 3 unspecified: N18.30

## 2021-06-02 LAB — COMPREHENSIVE METABOLIC PANEL
ALT: 33 U/L (ref 0–44)
AST: 33 U/L (ref 15–41)
Albumin: 2.4 g/dL — ABNORMAL LOW (ref 3.5–5.0)
Alkaline Phosphatase: 41 U/L (ref 38–126)
Anion gap: 15 (ref 5–15)
BUN: 23 mg/dL (ref 8–23)
CO2: 13 mmol/L — ABNORMAL LOW (ref 22–32)
Calcium: 8.7 mg/dL — ABNORMAL LOW (ref 8.9–10.3)
Chloride: 109 mmol/L (ref 98–111)
Creatinine, Ser: 2.34 mg/dL — ABNORMAL HIGH (ref 0.61–1.24)
GFR, Estimated: 28 mL/min — ABNORMAL LOW (ref >60)
Glucose, Bld: 279 mg/dL — ABNORMAL HIGH (ref 70–99)
Potassium: 4.7 mmol/L (ref 3.5–5.1)
Sodium: 137 mmol/L (ref 135–145)
Total Bilirubin: 0.8 mg/dL (ref 0.3–1.2)
Total Protein: 4.9 g/dL — ABNORMAL LOW (ref 6.5–8.1)

## 2021-06-02 LAB — I-STAT VENOUS BLOOD GAS, ED
Acid-base deficit: 23 mmol/L — ABNORMAL HIGH (ref 0.0–2.0)
Bicarbonate: 11.8 mmol/L — ABNORMAL LOW (ref 20.0–28.0)
Calcium, Ion: 1.18 mmol/L (ref 1.15–1.40)
HCT: 42 % (ref 39.0–52.0)
Hemoglobin: 14.3 g/dL (ref 13.0–17.0)
O2 Saturation: 48 %
Potassium: 4.9 mmol/L (ref 3.5–5.1)
Sodium: 138 mmol/L (ref 135–145)
TCO2: 14 mmol/L — ABNORMAL LOW (ref 22–32)
pCO2, Ven: 68.8 mmHg — ABNORMAL HIGH (ref 44.0–60.0)
pH, Ven: 6.844 — CL (ref 7.250–7.430)
pO2, Ven: 47 mmHg — ABNORMAL HIGH (ref 32.0–45.0)

## 2021-06-02 LAB — I-STAT ARTERIAL BLOOD GAS, ED
Acid-base deficit: 21 mmol/L — ABNORMAL HIGH (ref 0.0–2.0)
Bicarbonate: 13.8 mmol/L — ABNORMAL LOW (ref 20.0–28.0)
Calcium, Ion: 1.15 mmol/L (ref 1.15–1.40)
HCT: 35 % — ABNORMAL LOW (ref 39.0–52.0)
Hemoglobin: 11.9 g/dL — ABNORMAL LOW (ref 13.0–17.0)
O2 Saturation: 56 %
Patient temperature: 96.2
Potassium: 5 mmol/L (ref 3.5–5.1)
Sodium: 140 mmol/L (ref 135–145)
TCO2: 16 mmol/L — ABNORMAL LOW (ref 22–32)
pCO2 arterial: 78 mmHg (ref 32.0–48.0)
pH, Arterial: 6.846 — CL (ref 7.350–7.450)
pO2, Arterial: 49 mmHg — ABNORMAL LOW (ref 83.0–108.0)

## 2021-06-02 LAB — I-STAT CHEM 8, ED
BUN: 25 mg/dL — ABNORMAL HIGH (ref 8–23)
Calcium, Ion: 1.16 mmol/L (ref 1.15–1.40)
Chloride: 111 mmol/L (ref 98–111)
Creatinine, Ser: 2 mg/dL — ABNORMAL HIGH (ref 0.61–1.24)
Glucose, Bld: 263 mg/dL — ABNORMAL HIGH (ref 70–99)
HCT: 42 % (ref 39.0–52.0)
Hemoglobin: 14.3 g/dL (ref 13.0–17.0)
Potassium: 5 mmol/L (ref 3.5–5.1)
Sodium: 138 mmol/L (ref 135–145)
TCO2: 16 mmol/L — ABNORMAL LOW (ref 22–32)

## 2021-06-02 LAB — TROPONIN I (HIGH SENSITIVITY): Troponin I (High Sensitivity): 72 ng/L — ABNORMAL HIGH (ref <18)

## 2021-06-02 LAB — LIPASE, BLOOD: Lipase: 30 U/L (ref 11–51)

## 2021-06-02 LAB — RESP PANEL BY RT-PCR (FLU A&B, COVID) ARPGX2
Influenza A by PCR: NEGATIVE
Influenza B by PCR: NEGATIVE
SARS Coronavirus 2 by RT PCR: NEGATIVE

## 2021-06-02 MED ORDER — VASOPRESSIN 20 UNITS/100 ML INFUSION FOR SHOCK
0.0000 [IU]/min | INTRAVENOUS | Status: DC
  Administered 2021-06-02: 0.04 [IU]/min via INTRAVENOUS
  Filled 2021-06-02 (×3): qty 100

## 2021-06-02 MED ORDER — EPINEPHRINE HCL 5 MG/250ML IV SOLN IN NS
0.5000 ug/min | INTRAVENOUS | Status: DC
  Administered 2021-06-02: 10 ug/min via INTRAVENOUS

## 2021-06-02 MED ORDER — EPINEPHRINE 1 MG/10ML IJ SOSY
PREFILLED_SYRINGE | INTRAMUSCULAR | Status: DC | PRN
  Administered 2021-06-02: .4 mg via INTRAVENOUS
  Administered 2021-06-02: 1 mg via INTRAVENOUS
  Administered 2021-06-02: .2 mg via INTRAVENOUS
  Administered 2021-06-02 (×2): 1 mg via INTRAVENOUS

## 2021-06-02 MED ORDER — SODIUM BICARBONATE 8.4 % IV SOLN
INTRAVENOUS | Status: DC | PRN
  Administered 2021-06-02 (×2): 100 meq via INTRAVENOUS

## 2021-06-02 MED ORDER — MIDAZOLAM HCL 2 MG/2ML IJ SOLN: 1.0000 mg | INTRAMUSCULAR | Status: DC | PRN

## 2021-06-02 MED ORDER — EPINEPHRINE 1 MG/10ML IJ SOSY
PREFILLED_SYRINGE | INTRAMUSCULAR | Status: DC | PRN
  Administered 2021-06-02: 1 mg via INTRAVENOUS

## 2021-06-02 MED ORDER — FENTANYL CITRATE PF 50 MCG/ML IJ SOSY: 50.0000 ug | PREFILLED_SYRINGE | INTRAMUSCULAR | Status: DC | PRN

## 2021-06-02 MED ORDER — MIDAZOLAM HCL 2 MG/2ML IJ SOLN
1.0000 mg | INTRAMUSCULAR | Status: DC | PRN
Start: 2021-06-02 — End: 2021-06-02

## 2021-06-02 MED ORDER — PHENYLEPHRINE HCL (PRESSORS) 10 MG/ML IV SOLN
INTRAVENOUS | Status: DC | PRN
Start: 2021-06-02 — End: 2021-06-02
  Administered 2021-06-02: 120 ug via INTRAVENOUS

## 2021-06-02 MED ORDER — ETOMIDATE 2 MG/ML IV SOLN
INTRAVENOUS | Status: DC | PRN
  Administered 2021-06-02: 20 mg via INTRAVENOUS

## 2021-06-02 MED ORDER — MORPHINE SULFATE (PF) 4 MG/ML IV SOLN
4.0000 mg | Freq: Once | INTRAVENOUS | Status: AC
Start: 2021-06-02 — End: 2021-06-02
  Administered 2021-06-02: 4 mg via INTRAVENOUS
  Filled 2021-06-02: qty 1

## 2021-06-02 MED ORDER — ROCURONIUM BROMIDE 50 MG/5ML IV SOLN
INTRAVENOUS | Status: DC | PRN
  Administered 2021-06-02: 70 mg via INTRAVENOUS

## 2021-06-02 MED ORDER — NOREPINEPHRINE BITARTRATE 1 MG/ML IV SOLN
INTRAVENOUS | Status: DC | PRN
  Administered 2021-06-02: 10 ug/kg/min via INTRAVENOUS

## 2021-06-06 ENCOUNTER — Encounter (HOSPITAL_COMMUNITY): Payer: Medicare Other | Admitting: Internal Medicine

## 2021-06-12 ENCOUNTER — Other Ambulatory Visit: Payer: Self-pay | Admitting: *Deleted

## 2021-06-12 NOTE — Telephone Encounter (Signed)
Sympathy card sent 

## 2021-06-24 NOTE — ED Notes (Signed)
CPR initiated

## 2021-06-24 NOTE — ED Notes (Addendum)
TOD confirmed by RN [id number]. EKG and pulseless.

## 2021-06-24 NOTE — ED Triage Notes (Signed)
Pt family reports hearing noises and finds Pt unresponsive on ground. [facility] arrives to find Pt pulseless and apneic. CPR performed from 0950-1015, 5 EPI given, 1L bolus NS, King airway in place, CBG 150. HF pt with defib/pacemaker. Pt arrives to ED with ROSC, manual ventilations.

## 2021-06-24 NOTE — Code Documentation (Signed)
Pulse check, ROSC gained.

## 2021-06-24 NOTE — Telephone Encounter (Signed)
Please send sympathy card to family.

## 2021-06-24 NOTE — ED Notes (Signed)
CPR stopped by family and MD.

## 2021-06-24 NOTE — ED Provider Notes (Signed)
Procedure Name: Intubation Date/Time: 2021/06/15 10:47 AM Performed by: Garald Balding, PA-C Pre-anesthesia Checklist: Patient identified, Patient being monitored, Emergency Drugs available, Timeout performed and Suction available Oxygen Delivery Method: Ambu bag Preoxygenation: Pre-oxygenation with 100% oxygen Induction Type: Rapid sequence Ventilation: Mask ventilation without difficulty Laryngoscope Size: 4 and Mac Grade View: Grade III Tube size: 7.5 mm Number of attempts: 1 Airway Equipment and Method: Stylet Placement Confirmation: ETT inserted through vocal cords under direct vision, CO2 detector and Breath sounds checked- equal and bilateral Secured at: 25 cm Tube secured with: ETT holder Dental Injury: Teeth and Oropharynx as per pre-operative assessment        Garald Balding, PA-C 2021/06/15 1049    LongWonda Olds, MD 2021/06/15 1501

## 2021-06-24 NOTE — Code Documentation (Signed)
No pulse, CPR initiated @ 1134.

## 2021-06-24 NOTE — ED Notes (Signed)
Pulse check. Carotid + ROSC.

## 2021-06-24 NOTE — H&P (Signed)
NAME:  [name], MRN:  [id number], DOB:  May 24, 1946, LOS: 0 ADMISSION DATE:  Jun 19, 2021, CONSULTATION DATE:  10/10 REFERRING MD:  Dr. Laverta Baltimore, CHIEF COMPLAINT:  Cardiac Arrest    History of Present Illness:  75 y/o M who presented to [facility] ER via EMS on 10/10 after cardiac arrest.   At baseline, he is followed by Dr. Haroldine Laws for chronic combined congestive heart failure.  He has a question of cardiac sarcoidosis and is  s/p myocardial biopsy at [facility], did not show definitive sarcoid, showed interstitial and endocardial fibrosis / could not rule out remote or healed sarcoid.   Per report, patient was at home and family heard a noise in the other room.  He did not receive bystander CPR.  Approximately ~10 minutes before EMS activated. The patient received 5 rounds of epinephrine and EMS achieved ROSC in the ambulance bay.  He briefly lost pulses in the ER and required one round of CPR.  Epinephrine gtt was started with systolic in the 28'U.  He was placed on levophed in the ER with increase of systolic to the 13'K.  Initial I-STAT labs - venous ph 6.8, K 5+, glucose 263, sr cr 2, hgb 14.3.  CXR, labs pending. COVID / influenza testing pending.   The patient continued to have intermittent cardiac arrest in the ER.  He received 5-6 rounds of CPR in the ER. Family brought to the bedside during CPR efforts.  On arrival to assess patient for admission, active CPR was in progress.  PCCM called for ICU admission.   Pertinent  Medical History  CAD CHF - LVEF 20% 06/2020, s/p BiV AICD HTN PAF  Cardiac Sarcoidosis - s/p myocardial biopsy at [facility], did not show definitive sarcoid, showed interstitial and endocardial fibrosis / could not rule out remote or healed sarcoid  Aortic Aneurysm   PAD  MGUS with Chronic Thrombocytopenia  CKD III - baseline sr cr ~1.5 Glaucoma  DM  Tobacco Abuse - quit smoking 2021  Pulmonary nodules - 06/2020 CT   Significant Hospital Events: Including procedures,  antibiotic start and stop dates in addition to other pertinent events   10/10 Admit   Interim History / Subjective:  As above.    Objective   Height 5\' 11"  (1.803 m), weight 70 kg.    Vent Mode: PRVC FiO2 (%):  [100 %] 100 % Set Rate:  [20 bmp] 20 bmp Vt Set:  [600 mL] 600 mL PEEP:  [5 cmH20] 5 cmH20 Plateau Pressure:  [19 cmH20] 19 cmH20  No intake or output data in the 24 hours ending 06-19-21 1117 Filed Weights   06/19/21 1059  Weight: 70 kg    Examination: General: elderly male, critically ill appearing on vent, CPR in progress HENT: ETT Lungs: BVM ventilation in progress on exam  Cardiovascular: CPR in progress  Extremities: cool/dry Neuro: obtunded   Resolved Hospital Problem list     Assessment & Plan:   Cardiac Arrest with Shock  Acute Hypoxemic Respiratory Failure  Chronic Combined Congestive Heart Failure Questionable Cardiac Sarcoid  PAF, HTN  Acute Metabolic Acidosis  AKI MGUS with Thrombocytopenia Hyperglycemia   On arrival to ER, patient critically ill with CPR in progress.  He suffered multiple cardiac arrest in ER - 5 reported per EDP.  Chaplain at bedside.  Family at beside - two daughters, sister and brother.  Discussed with family multiple arrests and inability to achieve sustained ROSC.  Efforts stopped at 1200.  Support offered to family.  Labs   CBC: Recent Labs  Lab 14-Jun-2021 1038 06/14/2021 1040  HGB 14.3 14.3  HCT 42.0 67.5    Basic Metabolic Panel: Recent Labs  Lab Jun 14, 2021 1038 Jun 14, 2021 1040  NA 138 138  K 5.0 4.9  CL 111  --   GLUCOSE 263*  --   BUN 25*  --   CREATININE 2.00*  --    GFR: Estimated Creatinine Clearance: 32.1 mL/min (A) (by C-G formula based on SCr of 2 mg/dL (H)). No results for input(s): PROCALCITON, WBC, LATICACIDVEN in the last 168 hours.  Liver Function Tests: No results for input(s): AST, ALT, ALKPHOS, BILITOT, PROT, ALBUMIN in the last 168 hours. No results for input(s): LIPASE, AMYLASE in the  last 168 hours. No results for input(s): AMMONIA in the last 168 hours.  ABG    Component Value Date/Time   PHART 7.489 (H) 08/08/2020 1156   PCO2ART 33.5 08/08/2020 1156   PO2ART 97 08/08/2020 1156   HCO3 11.8 (L) 14-Jun-2021 1040   TCO2 14 (L) June 14, 2021 1040   ACIDBASEDEF 23.0 (H) 14-Jun-2021 1040   O2SAT 48.0 06-14-2021 1040     Coagulation Profile: No results for input(s): INR, PROTIME in the last 168 hours.  Cardiac Enzymes: No results for input(s): CKTOTAL, CKMB, CKMBINDEX, TROPONINI in the last 168 hours.  HbA1C: Hemoglobin A1C  Date/Time Value Ref Range Status  01/02/2021 11:47 AM 6.1 (A) 4.0 - 5.6 % Final   Hgb A1c MFr Bld  Date/Time Value Ref Range Status  08/06/2020 01:17 PM 6.1 (H) 4.8 - 5.6 % Final    Comment:    (NOTE) Pre diabetes:          5.7%-6.4%  Diabetes:              >6.4%  Glycemic control for   <7.0% adults with diabetes     CBG: No results for input(s): GLUCAP in the last 168 hours.  Review of Systems:   Unable to complete as patient is altered on mechanical ventilation post arrest.   Past Medical History:  He,  has a past medical history of ABDOMINAL AORTIC ANEURYSM, CAD, CHF, Diabetes mellitus without complication ([place]), GLAUCOMA, HYPERTENSION, and TOBACCO ABUSE.   Surgical History:   Past Surgical History:  Procedure Laterality Date   BIV ICD INSERTION CRT-D N/A 12/06/2020   Procedure: BIV ICD INSERTION CRT-D;  Surgeon: Vickie Epley, MD;  Location: [place] [facility];  Service: Cardiovascular;  Laterality: N/A;   Clavical Repair     CORNEAL TRANSPLANT     Right eye x 2   EYE SURGERY     Glaucoma   POCKET REVISION/RELOCATION N/A 12/27/2020   Procedure: POCKET REVISION/RELOCATION;  Surgeon: Vickie Epley, MD;  Location: [place] [facility];  Service: Cardiovascular;  Laterality: N/A;   RIGHT HEART CATH N/A 12/05/2020   Procedure: RIGHT HEART CATH;  Surgeon: Jolaine Artist, MD;  Location: [place] [facility];  Service:  Cardiovascular;  Laterality: N/A;   RIGHT/LEFT HEART CATH AND CORONARY ANGIOGRAPHY N/A 08/08/2020   Procedure: RIGHT/LEFT HEART CATH AND CORONARY ANGIOGRAPHY;  Surgeon: Jolaine Artist, MD;  Location: [place] [facility];  Service: Cardiovascular;  Laterality: N/A;     Social History:   reports that he has been smoking cigarettes. He has a 70.00 pack-year smoking history. He has quit using smokeless tobacco. He reports current drug use. Drug: Marijuana. He reports that he does not drink alcohol.   Family History:  His family history includes  Diabetes in his brother, mother, and sister.   Allergies No Known Allergies   Home Medications  Prior to Admission medications   Medication Sig Start Date End Date Taking? Authorizing Provider  amiodarone (PACERONE) 200 MG tablet Take 200 mg by mouth as directed. Take 1 tablet by mouth Monday-Friday    [provider]  aspirin 81 MG EC tablet Take 1 tablet (81 mg total) by mouth daily. 01/02/21   Vickie Epley, MD  atorvastatin (LIPITOR) 40 MG tablet Take 40 mg by mouth daily. 10/23/20   [provider]  cholecalciferol (VITAMIN D) 1000 UNITS tablet Take 1,000 Units by mouth 2 (two) times daily.     [provider]  empagliflozin (JARDIANCE) 25 MG TABS tablet Take 25 mg by mouth daily. 12/12/20   [provider]  lidocaine (XYLOCAINE) 2 % solution Use as directed 10 mLs in the mouth or throat every 4 (four) hours as needed for mouth pain. Swish, gargle, and spit 01/17/21   Cuthriell, Charline Bills, PA-C  methotrexate (RHEUMATREX) 2.5 MG tablet Take 5 tablets (12.5 mg total) by mouth once a week for 14 days, THEN 6 tablets (15 mg total) once a week for 14 days. 05/06/21 06/03/21  Bensimhon, Shaune Pascal, MD  metoprolol succinate (TOPROL-XL) 50 MG 24 hr tablet Take 50 mg by mouth daily. 12/12/20   [provider]  nystatin (MYCOSTATIN) 100000 UNIT/ML suspension Take 2 mLs (200,000 Units total) by mouth 4 (four) times  daily. 79ml to each cheek QID x 7 days 01/17/21   Cuthriell, Charline Bills, PA-C  potassium chloride 20 MEQ TBCR Take 1 tablet with Lasix. 02/10/21   Bensimhon, Shaune Pascal, MD  predniSONE (DELTASONE) 10 MG tablet Take 1.5 tablets (15 mg total) by mouth daily with breakfast. 05/06/21   Bensimhon, Shaune Pascal, MD  sacubitril-valsartan (ENTRESTO) 24-26 MG Take 1 tablet by mouth 2 (two) times daily. 05/06/21   Bensimhon, Shaune Pascal, MD  spironolactone (ALDACTONE) 25 MG tablet Take 1 tablet (25 mg total) by mouth daily. 10/29/20   Clegg, Amy D, NP  sulfamethoxazole-trimethoprim (BACTRIM DS) 800-160 MG tablet Take 1 tablet by mouth 3 (three) times a week. 01/08/21   Vickie Epley, MD     Critical care time:  57 minutes    Noe Gens, MSN, APRN, NP-C, AGACNP-BC [facility] 21-Jun-2021, 11:40 AM   Please see [email or web address] for pager details.   From 7A-7P if no response, please call [phone] After hours, please call ELink [phone]

## 2021-06-24 NOTE — ED Notes (Addendum)
Pulse check. No carotid. CPR resume.

## 2021-06-24 NOTE — Progress Notes (Signed)
This chaplain responded to ED page for family spiritual care after Pt. CPR.   The chaplain introduced herself to the Pt. daughters and was present with the family and medical team at the Pt. time of death.  The chaplain understands the family is declining continued spiritual care.  Chaplain Sallyanne Kuster [phone]

## 2021-06-24 NOTE — ED Notes (Signed)
Post mortem care completed, pt transports to morgue.

## 2021-06-24 NOTE — ED Provider Notes (Signed)
Emergency Department Provider Note   I have reviewed the triage vital signs and the nursing notes.   HISTORY  Chief Complaint Cardiac Arrest   HPI [name] is a 75 y.o. male with past history reviewed below arrives status post CPR with ROSC just prior to ED arrival.  Paramedics report they were called to the scene of unresponsive patient.  King airway was placed and initiated CPR with 5 doses of epinephrine given on scene. LUCAS device initiated.  Family report that the patient was sitting on the couch when he suddenly became unresponsive.  They did not initiate bystander CPR.  The patient's friend was there and initially called a brother and then called 91.  EMS began CPR on their arrival.  Family report the patient was complaining of some shortness of breath on Saturday but that he was not having other complaint.   Level 5 caveat: CPR   Past Medical History:  Diagnosis Date   ABDOMINAL AORTIC ANEURYSM    CAD    Non obstructive (mild) cath 2007   CHF    EF 15% in 2007, 25% currently   CKD (chronic kidney disease), stage III ([place])    Diabetes mellitus without complication ([place])    GLAUCOMA    HYPERTENSION    MGUS (monoclonal gammopathy of unknown significance)    PAF (paroxysmal atrial fibrillation) ([place])    Pulmonary nodules    TOBACCO ABUSE    Previous    Patient Active Problem List   Diagnosis Date Noted   ICD (implantable cardioverter-defibrillator) in place 03/11/2021   Tobacco use 02/06/2021   Thrush, oral 02/06/2021   Advanced directives, counseling/discussion 01/02/2021   Prediabetes 01/02/2021   Sarcoidosis 01/02/2021   Pulmonary nodule 01/02/2021   Corneal abnormality 01/02/2021   Abnormal positron emission tomography (PET) scan 01/02/2021   Bullous keratopathy 11/28/2020   Type 2 diabetes mellitus without complications ([place]) 93/71/6967   Diabetes mellitus without complication ([place]) 89/38/1017   Primary open-angle glaucoma, right eye, severe stage  11/28/2020   Myopia, bilateral 11/28/2020   Presbyopia 11/28/2020   Unspecified atrial fibrillation ([place]) 11/28/2020   Encounter for preprocedural laboratory examination 11/28/2020   Encounter for immunization 11/28/2020   Dietary counseling and surveillance 11/28/2020   Other specified counseling 11/28/2020   Other specified health status 11/28/2020   Erectile dysfunction 11/28/2020   Encounter for therapeutic drug level monitoring 11/28/2020   Hypertrophy of prostate without urinary obstruction and other lower urinary tract symptoms (LUTS) 11/28/2020   Cardiac sarcoidosis 11/28/2020   Closed fracture of ankle 11/28/2020   Corneal ulcer 11/28/2020   Cystoid macular edema 11/28/2020   Depression 11/28/2020   Lens replaced by other means 11/28/2020   Other and unspecified hyperlipidemia 11/28/2020   Pain in joint 11/28/2020   Purulent endophthalmitis 11/28/2020   Syncope and collapse 11/28/2020   Vitamin D deficiency 11/28/2020   Other specified glaucoma 10/23/2020   Other age-related cataract 10/23/2020   Atypical chest pain 08/07/2020   Elevated troponin 08/07/2020   Acute on chronic systolic (congestive) heart failure ([place]) 08/06/2020   Acute on chronic respiratory failure with hypoxia ([place]) 07/01/2020   Chronic kidney disease, stage 3a ([place]) 07/01/2020   Thrombocytopenia ([place]) 07/01/2020   Positive D dimer 07/01/2020   Acute hypoxemic respiratory failure ([facility])    PVD (peripheral vascular disease) ([place]) 08/29/2019   Educated about COVID-19 virus infection 08/29/2019   Bradycardia 09/05/2018   Dyslipidemia 09/05/2018   AAA (abdominal aortic aneurysm) without rupture 09/05/2018   Heart  block 03/10/2018   Cardiomyopathy ([place]) 10/29/2017   TOBACCO ABUSE 93/79/0240   CHRONIC SYSTOLIC HEART FAILURE 97/35/3299   Peripheral arterial occlusive disease ([place]) 11/30/2008   GLAUCOMA 11/29/2008   Essential (primary) hypertension 11/29/2008   Coronary atherosclerosis 11/29/2008    Monomorphic ventricular tachycardia ([place]) 11/29/2008   Unspecified systolic (congestive) heart failure ([place]) 11/29/2008    Past Surgical History:  Procedure Laterality Date   BIV ICD INSERTION CRT-D N/A 12/06/2020   Procedure: BIV ICD INSERTION CRT-D;  Surgeon: Vickie Epley, MD;  Location: [facility];  Service: Cardiovascular;  Laterality: N/A;   Clavical Repair     CORNEAL TRANSPLANT     Right eye x 2   EYE SURGERY     Glaucoma   POCKET REVISION/RELOCATION N/A 12/27/2020   Procedure: POCKET REVISION/RELOCATION;  Surgeon: Vickie Epley, MD;  Location: [facility];  Service: Cardiovascular;  Laterality: N/A;   RIGHT HEART CATH N/A 12/05/2020   Procedure: RIGHT HEART CATH;  Surgeon: Jolaine Artist, MD;  Location: [facility];  Service: Cardiovascular;  Laterality: N/A;   RIGHT/LEFT HEART CATH AND CORONARY ANGIOGRAPHY N/A 08/08/2020   Procedure: RIGHT/LEFT HEART CATH AND CORONARY ANGIOGRAPHY;  Surgeon: Jolaine Artist, MD;  Location: [facility];  Service: Cardiovascular;  Laterality: N/A;    Allergies Patient has no known allergies.  Family History  Problem Relation Age of Onset   Diabetes Mother    Diabetes Sister    Diabetes Brother     Social History Social History   Tobacco Use   Smoking status: Some Days    Packs/day: 2.00    Years: 35.00    Pack years: 70.00    Types: Cigarettes    Last attempt to quit: 03/08/2008    Years since quitting: 13.2   Smokeless tobacco: Former   Tobacco comments:    Has not smoked in 2 months   Vaping Use   Vaping Use: Never used  Substance Use Topics   Alcohol use: No    Comment: used to deink alcohol moderately, stopped in 2019   Drug use: Yes    Types: Marijuana    Comment: occasional    Review of Systems  Level 5 caveat: CPR - intubated   ____________________________________________   PHYSICAL EXAM:  VITAL SIGNS: Vitals:   June 26, 2021 1145 06-26-21 1200  BP: 108/74 (!)  152/94  Pulse: (!) 26   Resp: (!) 24 (!) 41  Temp: (!) 95.9 F (35.5 C) (!) 95.6 F (35.3 C)  SpO2: (!) 67%     Constitutional: Unresponsive. Occasional agonal respirations over bagging.  Eyes: Pupils are 4 mm and non-reactive. Opacified sclera on the right.  Head: Atraumatic. Nose: No congestion/rhinnorhea. Mouth/Throat: King airway in place.  Cardiovascular: Intermittently paced rhythm. Poor peripheral circulation. Cool extremities. Weak, thready pulse.  Respiratory: King airway in place. Easy to bag. Bilateral breath sounds noted.  Gastrointestinal: Mild distention.  Musculoskeletal: No gross deformities of extremities. Neurologic: Occasional agonal respirations. GCS 3. No posturing or myoclonic jerking.  Skin:  Skin is cool and dry.    ____________________________________________   LABS (all labs ordered are listed, but only abnormal results are displayed)  Labs Reviewed  COMPREHENSIVE METABOLIC PANEL - Abnormal; Notable for the following components:      Result Value   CO2 13 (*)    Glucose, Bld 279 (*)    Creatinine, Ser 2.34 (*)    Calcium 8.7 (*)    Total Protein 4.9 (*)  Albumin 2.4 (*)    GFR, Estimated 28 (*)    All other components within normal limits  I-STAT CHEM 8, ED - Abnormal; Notable for the following components:   BUN 25 (*)    Creatinine, Ser 2.00 (*)    Glucose, Bld 263 (*)    TCO2 16 (*)    All other components within normal limits  I-STAT VENOUS BLOOD GAS, ED - Abnormal; Notable for the following components:   pH, Ven 6.844 (*)    pCO2, Ven 68.8 (*)    pO2, Ven 47.0 (*)    Bicarbonate 11.8 (*)    TCO2 14 (*)    Acid-base deficit 23.0 (*)    All other components within normal limits  I-STAT ARTERIAL BLOOD GAS, ED - Abnormal; Notable for the following components:   pH, Arterial 6.846 (*)    pCO2 arterial 78.0 (*)    pO2, Arterial 49 (*)    Bicarbonate 13.8 (*)    TCO2 16 (*)    Acid-base deficit 21.0 (*)    HCT 35.0 (*)    Hemoglobin  11.9 (*)    All other components within normal limits  TROPONIN I (HIGH SENSITIVITY) - Abnormal; Notable for the following components:   Troponin I (High Sensitivity) 72 (*)    All other components within normal limits  RESP PANEL BY RT-PCR (FLU A&B, COVID) ARPGX2  LIPASE, BLOOD  COMPREHENSIVE METABOLIC PANEL  CBC  LACTIC ACID, PLASMA  LACTIC ACID, PLASMA  MAGNESIUM  PHOSPHORUS  TROPONIN I (HIGH SENSITIVITY)   ____________________________________________  RADIOLOGY  DG Chest Portable 1 View  Result Date: 06-17-21 CLINICAL DATA:  Intubation. EXAM: PORTABLE CHEST 1 VIEW COMPARISON:  December 07, 2020. FINDINGS: Mild cardiomegaly is noted. Endotracheal tube tip is seen 2 cm above the carina. Nasogastric tube tip is seen in mid esophagus left-sided fibrillator is unchanged in position. No pneumothorax or pleural effusion is noted. Mild central pulmonary vascular congestion is noted. Possible left perihilar opacity is noted. Right midlung atelectasis or infiltrate is noted. Bony thorax is unremarkable. IMPRESSION: Endotracheal tube in grossly good position. Distal tip of nasogastric tube is seen in mid esophagus; advancement is recommended. Pulmonary vascular congestion is noted with left perihilar opacity concerning for possible pneumonia or edema. Right midlung opacity is noted concerning for atelectasis or infiltrate. Electronically Signed   By: Marijo Conception M.D.   On: 06-17-2021 12:18    ____________________________________________   PROCEDURES  Procedure(s) performed:   .Critical Care Performed by: Margette Fast, MD Authorized by: Margette Fast, MD   Critical care provider statement:    Critical care time (minutes):  70   Critical care time was exclusive of:  Separately billable procedures and treating other patients and teaching time   Critical care was necessary to treat or prevent imminent or life-threatening deterioration of the following conditions:  Cardiac failure and  shock   Critical care was time spent personally by me on the following activities:  Discussions with consultants, evaluation of patient's response to treatment, examination of patient, ordering and performing treatments and interventions, ordering and review of laboratory studies, ordering and review of radiographic studies, pulse oximetry, re-evaluation of patient's condition, obtaining history from patient or surrogate, review of old charts, blood draw for specimens, development of treatment plan with patient or surrogate and ventilator management   I assumed direction of critical care for this patient from another provider in my specialty: no     Cardiopulmonary Resuscitation (CPR) Procedure Note  Directed/Performed by: Margette Fast I personally directed ancillary staff and/or performed CPR in an effort to regain return of spontaneous circulation and to maintain cardiac, neuro and systemic perfusion.   ____________________________________________   INITIAL IMPRESSION / ASSESSMENT AND PLAN / ED COURSE  Pertinent labs & imaging results that were available during my care of the patient were reviewed by me and considered in my medical decision making (see chart for details).   Arrives with CPR in progress but ROSC achieved just prior to arrival.  [facility] airway in place and able to bag easily.  Patient is hypotensive.  Gave phenylephrine push initially followed by Levophed drip. King airway exchanged after improved BP. Pacemaker noted and intermittently pacing. No clear v-tach or other acute arrhythmia. Patient noted to have an systolic CHF in brief chart review on arrival. Family en route to the ED. No bystander CPR initiated by family.   Paged cardiology and ICU team immediately upon arrival to the ED.   Patient is intermittently losing pulses despite increasing pressors. Added Epi infusion in addition to NE. Normal K. Bedside ECHO performed with no large pericardial effusion. EF appears close to  known EF form chart review.   11:27 AM  Have been in close contact with family both in the consultation room and now at bedside.  Patient has lost his pulses to additional times since arrival in the emergency department.  He will initially respond to push dose epi but is not holding his pressure with Levophed and epi infusions running.  I have ordered vasopressin.  Family understands his grave medical condition.  We discussed goals of care at this point and if additional CPR would be beneficial.  They would like to spend time with the patient at the bedside but for now want to continue full scope of care.   12:02 PM CCM at bedside. Updated on 4-5 episodes of CPR in the ED. Unable to maintain pulses with multiple pressors and now Vasopressin. After further discussion and additional CPR, family has decided to stop CPR.   Time of death: 12:12 PM ____________________________________________  FINAL CLINICAL IMPRESSION(S) / ED DIAGNOSES  Final diagnoses:  Cardiopulmonary arrest ([place])    MEDICATIONS GIVEN DURING THIS VISIT:  Medications  phenylephrine (NEO-SYNEPHRINE) injection (120 mcg Intravenous Given 06/11/2021 1028)  norepinephrine (LEVOPHED) injection (0 mcg/kg/min  70 kg (Order-Specific) Intravenous Stopped 2021/06/11 1209)  EPINEPHrine (ADRENALIN) 1 MG/10ML injection (1 mg Intravenous Given June 11, 2021 1114)  etomidate (AMIDATE) injection (20 mg Intravenous Given 2021/06/11 1041)  rocuronium (ZEMURON) injection (70 mg Intravenous Given 06/11/21 1041)  fentaNYL (SUBLIMAZE) injection 50 mcg (has no administration in time range)  fentaNYL (SUBLIMAZE) injection 50 mcg (has no administration in time range)  midazolam (VERSED) injection 1 mg (has no administration in time range)  midazolam (VERSED) injection 1 mg (has no administration in time range)  EPINEPHrine (ADRENALIN) 5 mg in NS 250 mL (0.02 mg/mL) premix infusion (0 mcg/min Intravenous Stopped June 11, 2021 1209)  vasopressin (PITRESSIN) 20 Units  in sodium chloride 0.9 % 100 mL infusion-*FOR SHOCK* (0 Units/min Intravenous Stopped 06/11/2021 1209)  sodium bicarbonate injection (100 mEq Intravenous Given 06/11/21 1104)  EPINEPHrine (ADRENALIN) 1 MG/10ML injection (1 mg Intravenous Given 06-11-21 1134)  morphine 4 MG/ML injection 4 mg (4 mg Intravenous Given 2021/06/11 1209)    Note:  This document was prepared using Dragon voice recognition software and may include unintentional dictation errors.  Nanda Quinton, MD, [facility] Emergency Medicine    Lannis Lichtenwalner, [facility], MD 06-11-21 1500

## 2021-06-24 NOTE — ED Notes (Signed)
Pulseless. CPR initiated.

## 2021-06-24 NOTE — ED Notes (Signed)
[name] daughter [phone] requesting an update

## 2021-06-24 DEATH — deceased

## 2021-07-01 ENCOUNTER — Ambulatory Visit: Payer: Medicare Other | Admitting: *Deleted

## 2021-08-08 ENCOUNTER — Other Ambulatory Visit: Payer: Medicare Other

## 2021-12-08 ENCOUNTER — Other Ambulatory Visit: Payer: Medicare Other

## 2021-12-08 ENCOUNTER — Ambulatory Visit: Payer: Medicare Other | Admitting: Hematology

## 2022-08-05 IMAGING — DX DG CHEST 1V PORT
1 series · 1 of 1 positions shown · non-contrast
Comparison: CT 05/24/2006, radiograph 04/09/2006

CLINICAL DATA: Shortness of breath, runs of ventricular
tachycardia, first-degree block

EXAM:
PORTABLE CHEST 1 VIEW

[chest ap]
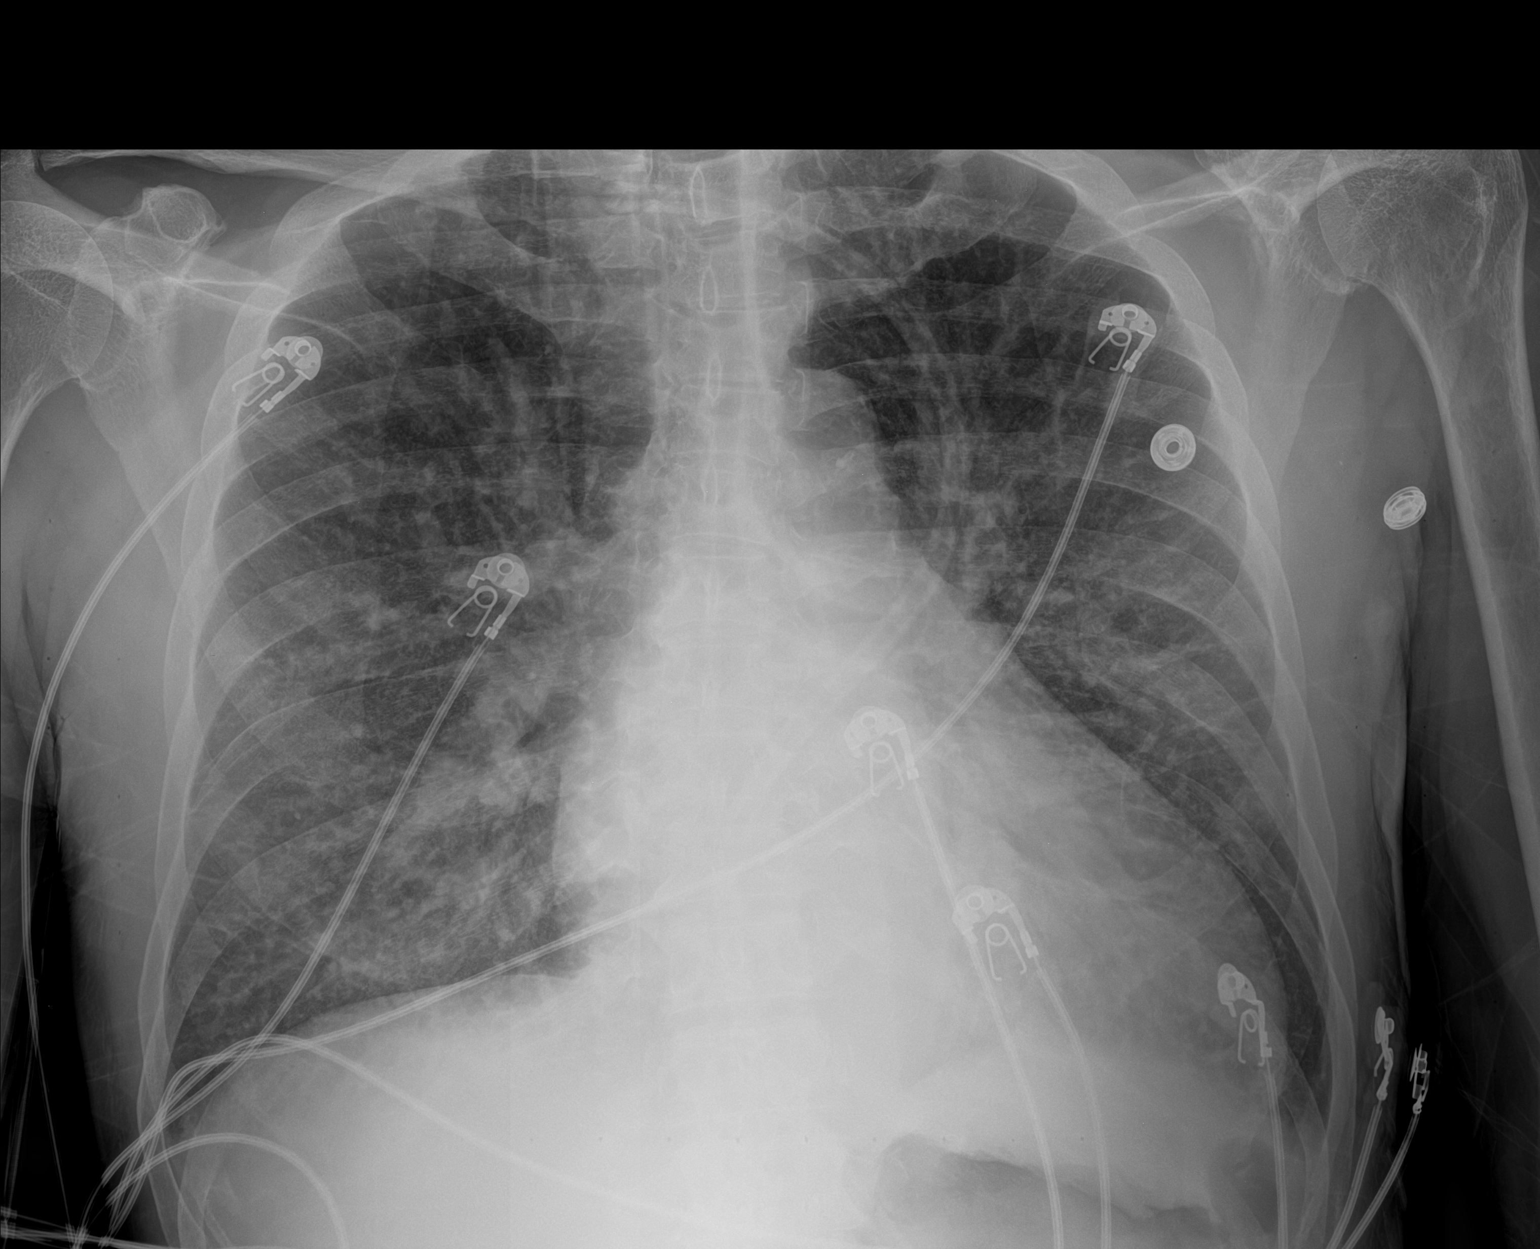

[1 of 1 positions shown; findings below may reference images not displayed]

FINDINGS: Diffuse hazy opacities in both lungs with some marked central
vascular congestion indistinct pulmonary vascularity. Peripheral
septal lines and fissural thickening are noted as well. Trace
bilateral effusions. No pneumothorax. Cardiac silhouette is likely
enlarged accounting for portable technique with a calcified aorta.
Mild body wall edema. No other acute osseous or soft tissue
abnormality. Telemetry leads overlie the chest. Remote left
clavicular resection.
IMPRESSION: Features most compatible with CHF/volume overload with cardiomegaly,
edema and bilateral effusions.

## 2022-09-10 IMAGING — CR DG CHEST 2V
2 series · 2 of 2 positions shown · non-contrast
Comparison: Chest radiograph and CTA 07/01/2020

CLINICAL DATA: Shortness of breath.

EXAM:
CHEST - 2 VIEW

[chest pa]
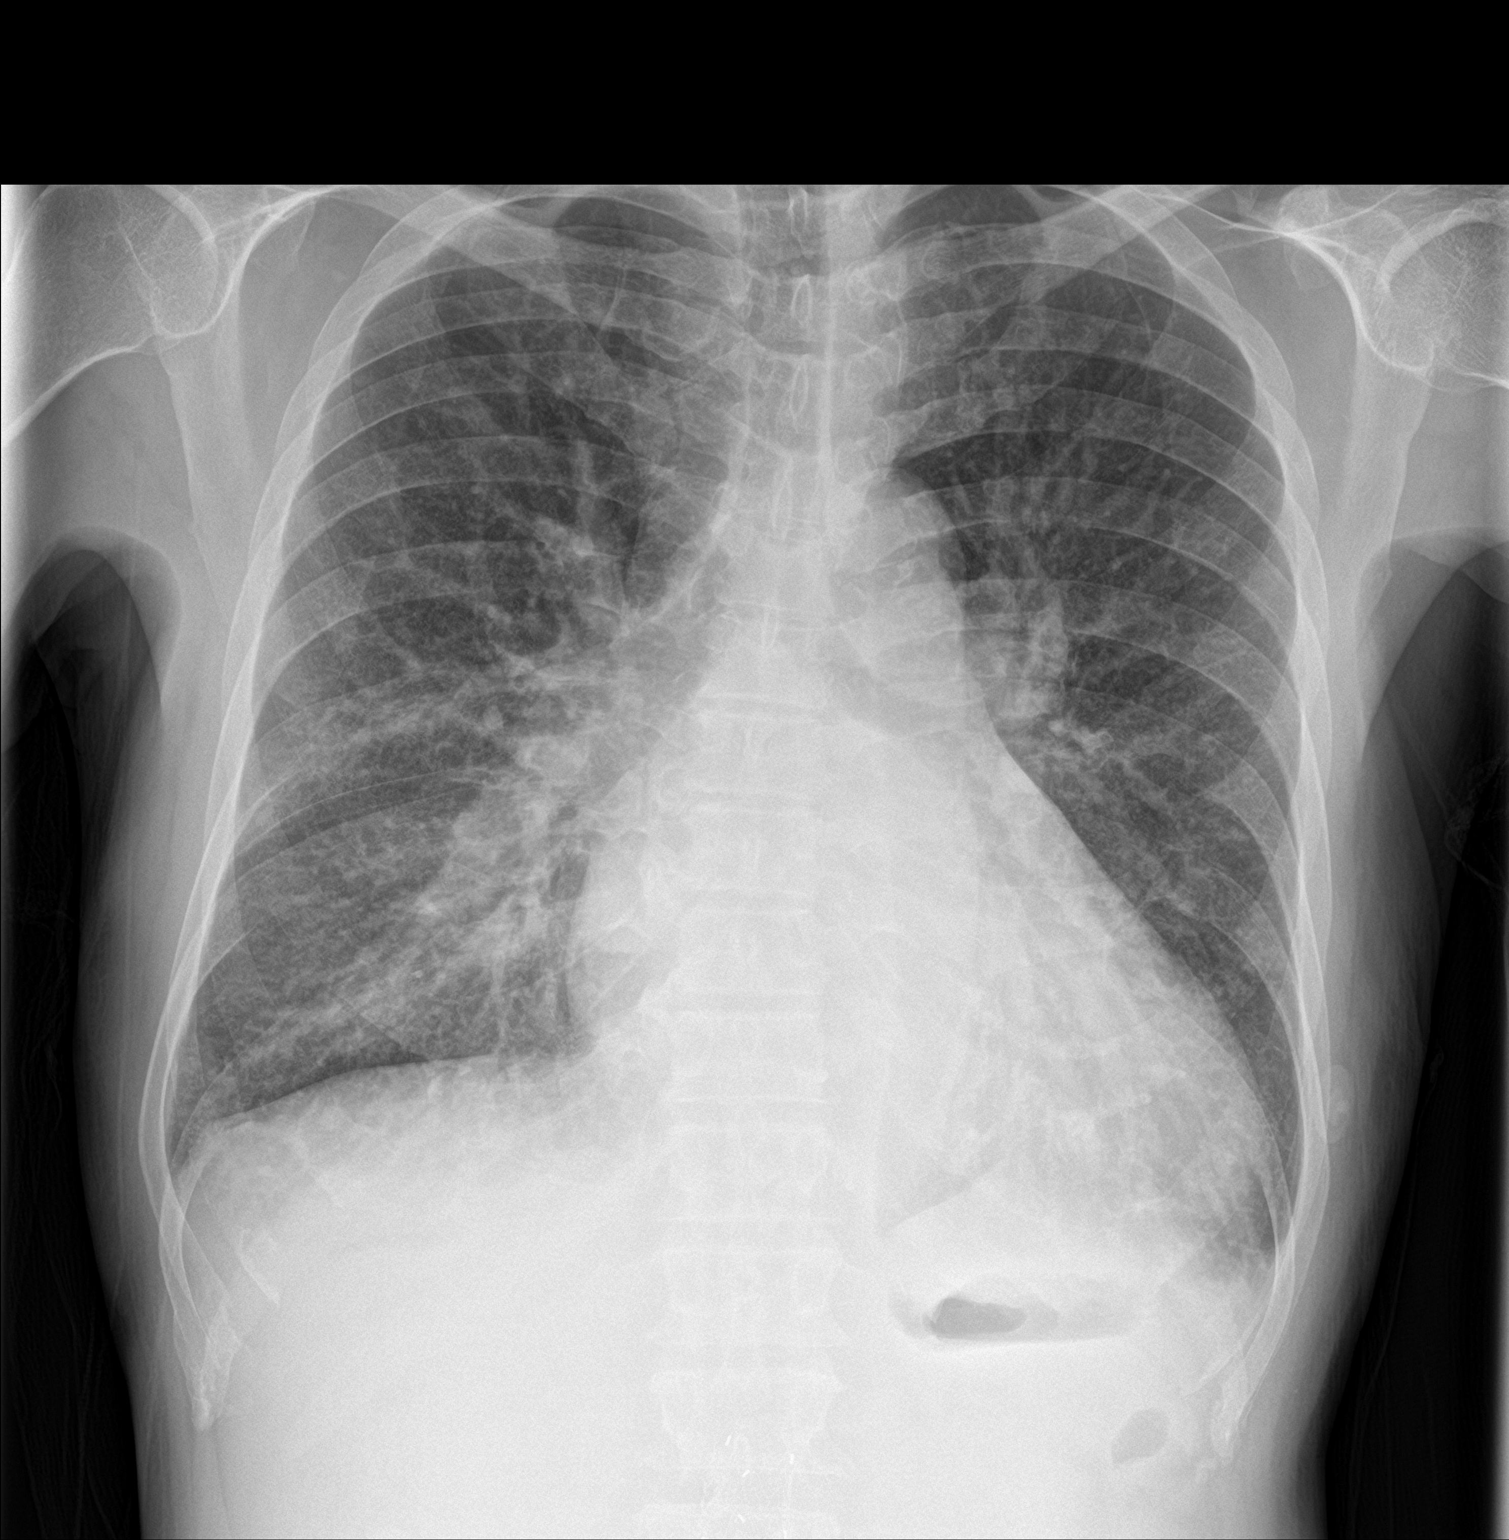

[chest lat]
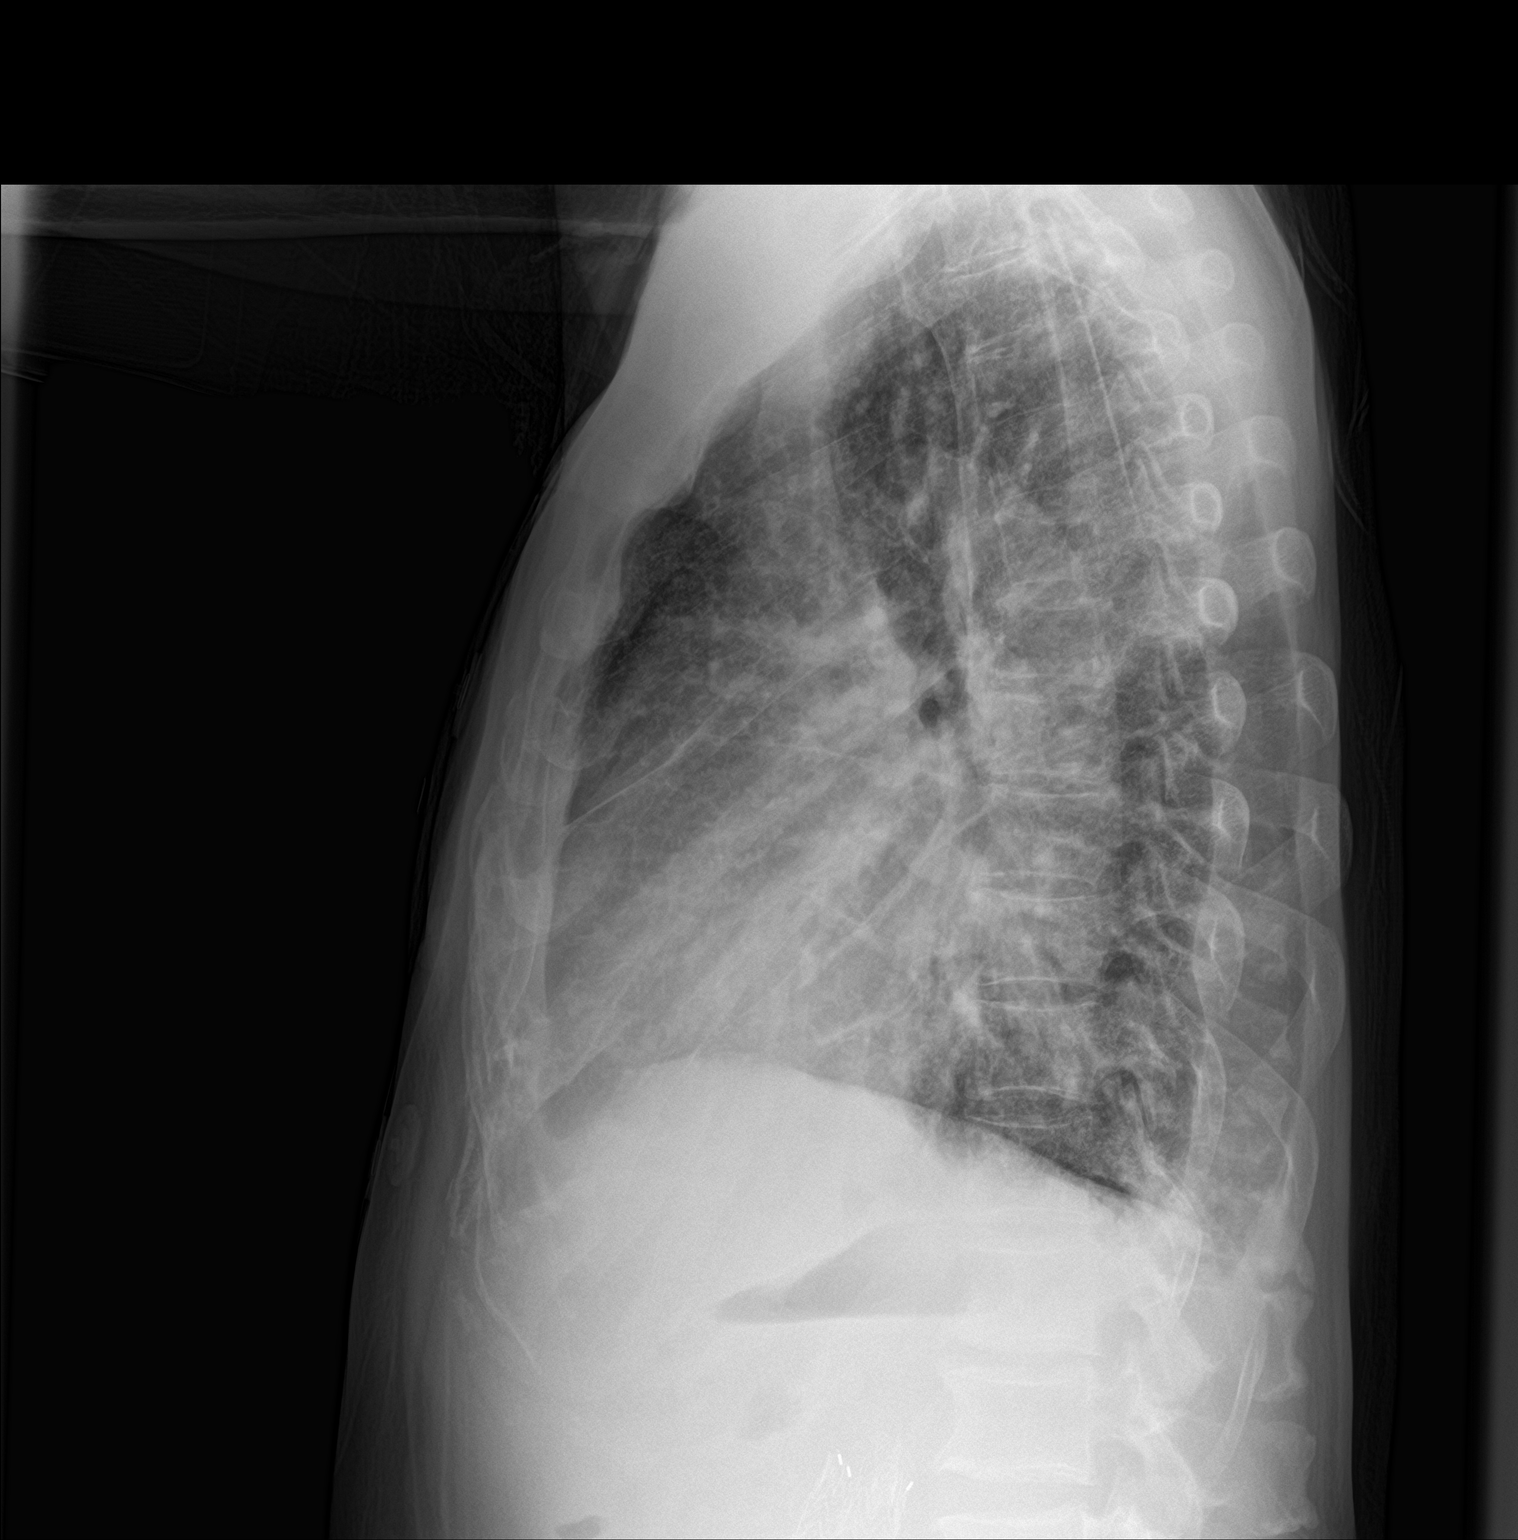

[2 of 2 positions shown; findings below may reference images not displayed]

FINDINGS: The cardiac silhouette remains mildly enlarged. Pulmonary vascular
congestion and bilateral interstitial opacities are similar to the
prior study. There may be trace pleural effusions. No pneumothorax
is identified. An abdominal aortic stent graft is partially
visualized. No acute osseous abnormality is seen.
IMPRESSION: Cardiomegaly with bilateral lung opacities compatible with edema.

## 2022-09-13 IMAGING — MR MR CARD MORPHOLOGY WO/W CM
45 of 48 series · 45 of 48 positions shown · IV contrast (gadavist)
Comparison: none

CLINICAL DATA: 74-year-old male with h/o moderate non-obstructive
CAD, severe non-ischemic cardiomyopathy with LVEF 15%. Evaluate
LVEF.

EXAM:
CARDIAC MRI
TECHNIQUE: The patient was scanned on a 1.5 Tesla GE magnet. A dedicated
cardiac coil was used. Functional imaging was done using Fiesta
sequences. [DATE], and 4 chamber views were done to assess for RWMA's.
Modified Radeek rule using a short axis stack was used to
calculate an ejection fraction on a dedicated work station using
Circle software. The patient received 7 cc of Gadavist. After 10
minutes inversion recovery sequences were used to assess for
infiltration and scar tissue.
CONTRAST:  7 cc  of Gadavist

[Series 4: [id number] · axial · 8.0mm · 1.41mm/px · 1 of 19 slices shown]
[im 1/19]
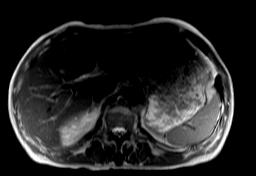

[Series 8: bSSFP · oblique · 8.0mm · 1.61mm/px · 1 of 25 slices shown (1 of 26)]
[im 1/25]
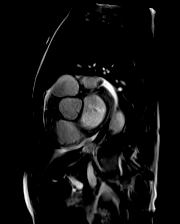

[Series 9: bSSFP · oblique · 8.0mm · 1.61mm/px · 1 of 25 slices shown (2 of 26)]
[im 1/25]
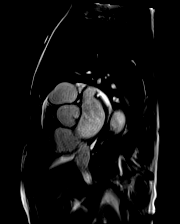

[Series 10: bSSFP · oblique · 8.0mm · 1.61mm/px · 1 of 25 slices shown (3 of 26)]
[im 1/25]
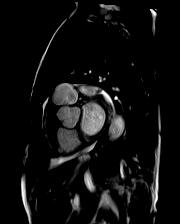

[Series 11: bSSFP · oblique · 8.0mm · 1.61mm/px · 1 of 25 slices shown (4 of 26)]
[im 1/25]
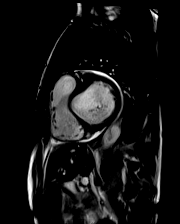

[Series 12: bSSFP · oblique · 8.0mm · 1.61mm/px · 1 of 25 slices shown (5 of 26)]
[im 1/25]
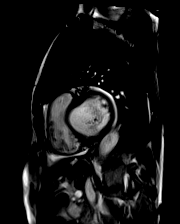

[Series 13: bSSFP · oblique · 8.0mm · 1.61mm/px · 1 of 25 slices shown (6 of 26)]
[im 1/25]
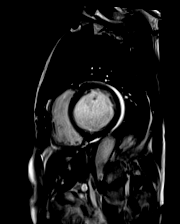

[Series 14: bSSFP · oblique · 8.0mm · 1.61mm/px · 1 of 25 slices shown (7 of 26)]
[im 1/25]
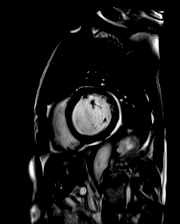

[Series 15: bSSFP · oblique · 8.0mm · 1.61mm/px · 1 of 25 slices shown (8 of 26)]
[im 1/25]
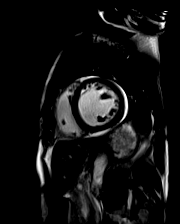

[Series 16: bSSFP · oblique · 8.0mm · 1.61mm/px · 1 of 25 slices shown (9 of 26)]
[im 1/25]
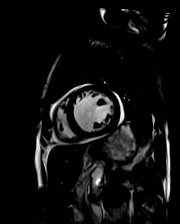

[Series 17: bSSFP · oblique · 8.0mm · 1.61mm/px · 1 of 25 slices shown (10 of 26)]
[im 1/25]
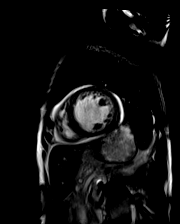

[Series 18: bSSFP · oblique · 8.0mm · 1.61mm/px · 1 of 25 slices shown (11 of 26)]
[im 1/25]
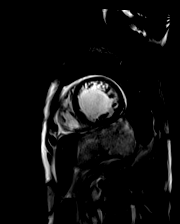

[Series 19: bSSFP · oblique · 8.0mm · 1.61mm/px · 1 of 25 slices shown (12 of 26)]
[im 1/25]
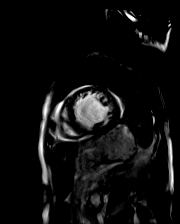

[Series 20: bSSFP · oblique · 8.0mm · 1.61mm/px · 1 of 25 slices shown (13 of 26)]
[im 1/25]
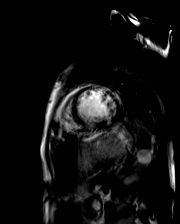

[Series 21: bSSFP · oblique · 8.0mm · 1.61mm/px · 1 of 25 slices shown (14 of 26)]
[im 1/25]
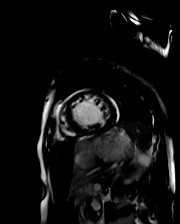

[Series 22: bSSFP · oblique · 8.0mm · 1.61mm/px · 1 of 25 slices shown (15 of 26)]
[im 1/25]
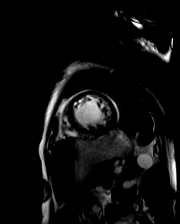

[Series 23: bSSFP · oblique · 8.0mm · 1.61mm/px · 1 of 25 slices shown (16 of 26)]
[im 1/25]
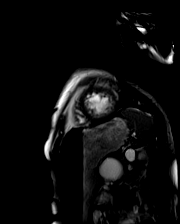

[Series 24: bSSFP · oblique · 8.0mm · 1.61mm/px · 1 of 25 slices shown (17 of 26)]
[im 1/25]
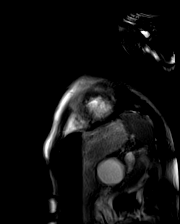

[Series 25: bSSFP · oblique · 8.0mm · 1.61mm/px · 1 of 25 slices shown (18 of 26)]
[im 1/25]
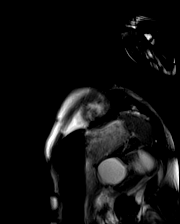

[Series 26: bSSFP · oblique · 8.0mm · 1.61mm/px · 1 of 25 slices shown (19 of 26)]
[im 1/25]
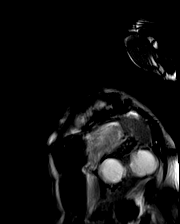

[Series 27: bSSFP · oblique · 8.0mm · 1.61mm/px · 1 of 25 slices shown (20 of 26)]
[im 1/25]
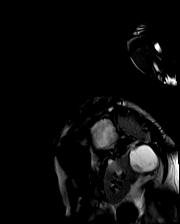

[Series 28: bSSFP · oblique · 8.0mm · 1.61mm/px · 1 of 25 slices shown (21 of 26)]
[im 1/25]
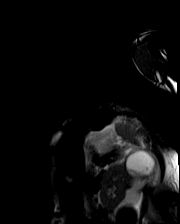

[Series 29: bSSFP · oblique · 8.0mm · 1.61mm/px · 1 of 25 slices shown (22 of 26)]
[im 1/25]
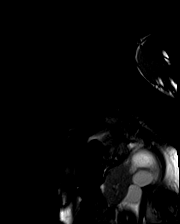

[Series 30: bSSFP · oblique · 6.0mm · 1.41mm/px · 1 of 25 slices shown (23 of 26)]
[im 1/25]
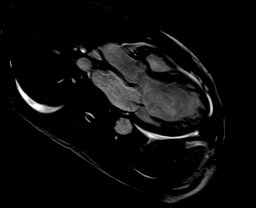

[Series 31: bSSFP · coronal · 6.0mm · 1.41mm/px · 1 of 25 slices shown (24 of 26)]
[im 1/25]
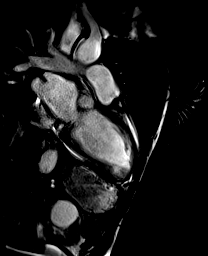

[Series 32: bSSFP · oblique · 6.0mm · 1.41mm/px · 1 of 25 slices shown (25 of 26)]
[im 1/25]
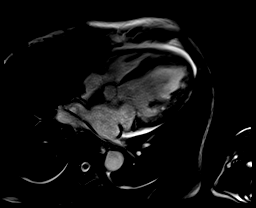

[Series 33: bSSFP · coronal · 6.0mm · 1.41mm/px · 1 of 25 slices shown (26 of 26)]
[im 1/25]
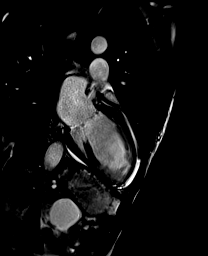

[Series 34: cine_trufi_cs_rt_short axis · oblique · 8.0mm · 1.73mm/px · 1 of 49 slices shown (1 of 18)]
[im 1/49]
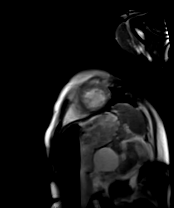

[Series 34: cine_trufi_cs_rt_short axis · oblique · 8.0mm · 1.73mm/px · 1 of 49 slices shown (2 of 18)]
[im 1/49]
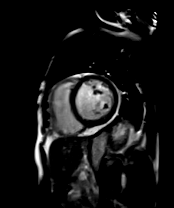

[Series 34: cine_trufi_cs_rt_short axis · oblique · 8.0mm · 1.73mm/px · 1 of 49 slices shown (3 of 18)]
[im 1/49]
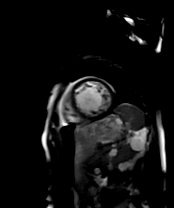

[Series 34: cine_trufi_cs_rt_short axis · oblique · 8.0mm · 1.73mm/px · 1 of 49 slices shown (4 of 18)]
[im 1/49]
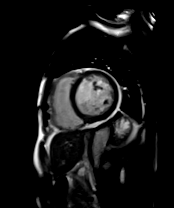

[Series 34: cine_trufi_cs_rt_short axis · oblique · 8.0mm · 1.73mm/px · 1 of 49 slices shown (5 of 18)]
[im 1/49]
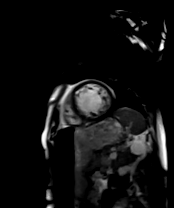

[Series 34: cine_trufi_cs_rt_short axis · oblique · 8.0mm · 1.73mm/px · 1 of 49 slices shown (6 of 18)]
[im 1/49]
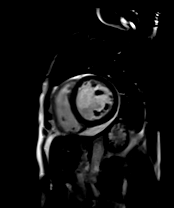

[Series 34: cine_trufi_cs_rt_short axis · oblique · 8.0mm · 1.73mm/px · 1 of 49 slices shown (7 of 18)]
[im 1/49]
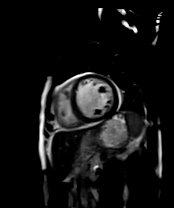

[Series 34: cine_trufi_cs_rt_short axis · oblique · 8.0mm · 1.73mm/px · 1 of 49 slices shown (8 of 18)]
[im 1/49]
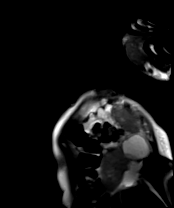

[Series 34: cine_trufi_cs_rt_short axis · oblique · 8.0mm · 1.73mm/px · 1 of 49 slices shown (9 of 18)]
[im 1/49]
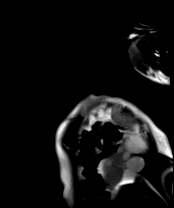

[Series 34: cine_trufi_cs_rt_short axis · oblique · 8.0mm · 1.73mm/px · 1 of 49 slices shown (10 of 18)]
[im 1/49]
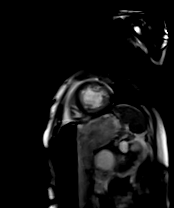

[Series 34: cine_trufi_cs_rt_short axis · oblique · 8.0mm · 1.73mm/px · 1 of 49 slices shown (11 of 18)]
[im 1/49]
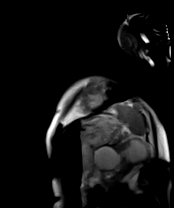

[Series 34: cine_trufi_cs_rt_short axis · oblique · 8.0mm · 1.73mm/px · 1 of 49 slices shown (12 of 18)]
[im 1/49]
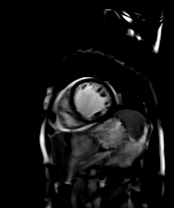

[Series 34: cine_trufi_cs_rt_short axis · oblique · 8.0mm · 1.73mm/px · 1 of 49 slices shown (13 of 18)]
[im 1/49]
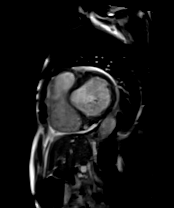

[Series 34: cine_trufi_cs_rt_short axis · oblique · 8.0mm · 1.73mm/px · 1 of 49 slices shown (14 of 18)]
[im 1/49]
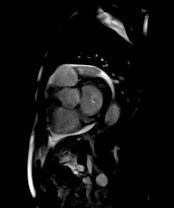

[Series 34: cine_trufi_cs_rt_short axis · oblique · 8.0mm · 1.73mm/px · 1 of 49 slices shown (15 of 18)]
[im 1/49]
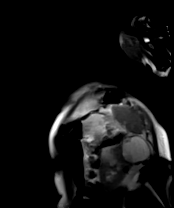

[Series 34: cine_trufi_cs_rt_short axis · oblique · 8.0mm · 1.73mm/px · 1 of 49 slices shown (16 of 18)]
[im 1/49]
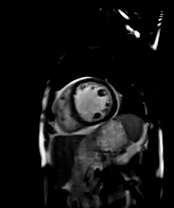

[Series 34: cine_trufi_cs_rt_short axis · oblique · 8.0mm · 1.73mm/px · 1 of 49 slices shown (17 of 18)]
[im 1/49]
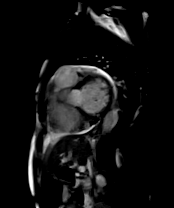

[Series 34: cine_trufi_cs_rt_short axis · oblique · 8.0mm · 1.73mm/px · 1 of 49 slices shown (18 of 18)]
[im 1/49]
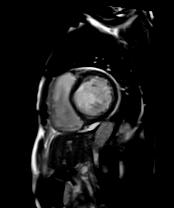

[45 of 48 positions shown; findings below may reference images not displayed]

FINDINGS: 1. Moderately dilated left ventricle with normal wall thickness and
severely decreased systolic function (LVEF = 24%). There is severe
diffuse hypokinesis and diffuse midwall late gadolinium enhancement
of the left ventricular myocardium. Ratio of the non-compacted
myocardium is 4.8 : 1. Elevated ECV 45%.

LVEDD: 64 mm

LVESD: 60 mm

LVEDV: 302 ml

LVESV: 229 ml

SV: 73 ml

CO: 4.6 L/min

Myocardial mass: 148 g

2. Normal right ventricular size, thickness and moderately decreased
systolic function (RVEF = 30%). There is diffuse hypokinesis.

3.  Mildly dilated left atrium.  Normal right atrial size.

4. Normal size of the aortic root, ascending aorta and pulmonary
artery.

5.  Moderate mitral and mild aortic and tricuspid regurgitation.

6.  Normal pericardium.  Mild circumferential pericardial effusion.
IMPRESSION: 1. Moderately dilated left ventricle with normal wall thickness and
severely decreased systolic function (LVEF = 24%). There is severe
diffuse hypokinesis and diffuse midwall late gadolinium enhancement
of the left ventricular myocardium. Elevated ECV 45%.

2. Normal right ventricular size, thickness and moderately decreased
systolic function (RVEF = 30%). There is diffuse hypokinesis.

3. Mildly dilated left atrium.  Normal right atrial size.

4. Normal size of the aortic root, ascending aorta and pulmonary
artery.

5. Moderate mitral and mild aortic and tricuspid regurgitation.

6. Normal pericardium. Mild circumferential pericardial effusion.

These findings are consistent with non-ischemic cardiomyopathy and
are highly suspicious for diffuse cardiac sarcoidosis. ECV is very
elevated at 45% (amyloid range, however no other findings suggestive
of cardiac amyloidosis), however most probably represent diffuse
scarring. His chest CTA from 07/01/2020 shows mild symmetric hilar
adenopathy. Additional evaluation with [id number] PET is recommended.
